# Patient Record
Sex: Female | Born: 1975 | Hispanic: No | Marital: Single | Smoking: Never smoker
Health system: Southern US, Community
[De-identification: ages and names within clinical notes are randomized; demographics above are authoritative.]

## PROBLEM LIST (undated history)

## (undated) ENCOUNTER — Emergency Department: Discharge status: Against Medical Advice | Payer: Self-pay

## (undated) HISTORY — PX: OTHER SURGICAL HISTORY: SHX169

## (undated) HISTORY — PX: CHOLECYSTECTOMY: SHX55

---

## 1999-07-17 ENCOUNTER — Encounter: Payer: Self-pay | Admitting: *Deleted

## 1999-07-17 ENCOUNTER — Encounter: Admission: RE | Admit: 1999-07-17 | Discharge: 1999-07-17 | Payer: Self-pay | Admitting: *Deleted

## 2001-07-05 ENCOUNTER — Ambulatory Visit (HOSPITAL_COMMUNITY): Admission: RE | Admit: 2001-07-05 | Discharge: 2001-07-05 | Payer: Self-pay | Admitting: *Deleted

## 2002-07-19 ENCOUNTER — Encounter: Payer: Self-pay | Admitting: Family Medicine

## 2002-07-19 ENCOUNTER — Ambulatory Visit (HOSPITAL_COMMUNITY): Admission: RE | Admit: 2002-07-19 | Discharge: 2002-07-19 | Payer: Self-pay | Admitting: Family Medicine

## 2002-10-28 ENCOUNTER — Emergency Department (HOSPITAL_COMMUNITY): Admission: EM | Admit: 2002-10-28 | Discharge: 2002-10-29 | Payer: Self-pay

## 2002-10-30 ENCOUNTER — Emergency Department (HOSPITAL_COMMUNITY): Admission: EM | Admit: 2002-10-30 | Discharge: 2002-10-30 | Payer: Self-pay | Admitting: Emergency Medicine

## 2009-06-05 ENCOUNTER — Encounter: Admission: RE | Admit: 2009-06-05 | Discharge: 2009-07-07 | Payer: Self-pay | Admitting: Sports Medicine

## 2009-10-16 ENCOUNTER — Ambulatory Visit: Payer: Self-pay | Admitting: Emergency Medicine

## 2009-12-11 ENCOUNTER — Ambulatory Visit: Payer: Self-pay | Admitting: Family Medicine

## 2009-12-11 ENCOUNTER — Encounter: Admission: RE | Admit: 2009-12-11 | Discharge: 2009-12-11 | Payer: Self-pay | Admitting: Family Medicine

## 2009-12-11 DIAGNOSIS — R109 Unspecified abdominal pain: Secondary | ICD-10-CM | POA: Insufficient documentation

## 2009-12-11 DIAGNOSIS — K810 Acute cholecystitis: Secondary | ICD-10-CM | POA: Insufficient documentation

## 2009-12-16 ENCOUNTER — Encounter: Payer: Self-pay | Admitting: Family Medicine

## 2009-12-22 ENCOUNTER — Ambulatory Visit: Payer: Self-pay | Admitting: Family Medicine

## 2009-12-22 DIAGNOSIS — I10 Essential (primary) hypertension: Secondary | ICD-10-CM | POA: Insufficient documentation

## 2009-12-23 LAB — CONVERTED CEMR LAB
ALT: 33 units/L (ref 0–35)
AST: 28 units/L (ref 0–37)
Albumin: 4.1 g/dL (ref 3.5–5.2)
Alkaline Phosphatase: 57 units/L (ref 39–117)
BUN: 12 mg/dL (ref 6–23)
CO2: 24 meq/L (ref 19–32)
Calcium: 9.2 mg/dL (ref 8.4–10.5)
Chloride: 106 meq/L (ref 96–112)
Cholesterol: 152 mg/dL (ref 0–200)
Creatinine, Ser: 0.65 mg/dL (ref 0.40–1.20)
Glucose, Bld: 83 mg/dL (ref 70–99)
HDL: 35 mg/dL — ABNORMAL LOW (ref >39)
LDL Cholesterol: 97 mg/dL (ref 0–99)
Potassium: 4.4 meq/L (ref 3.5–5.3)
Sodium: 141 meq/L (ref 135–145)
TSH: 3.074 microintl units/mL (ref 0.350–4.500)
Total Bilirubin: 0.4 mg/dL (ref 0.3–1.2)
Total CHOL/HDL Ratio: 4.3
Total Protein: 7.3 g/dL (ref 6.0–8.3)
Triglycerides: 102 mg/dL (ref <150)
VLDL: 20 mg/dL (ref 0–40)

## 2009-12-26 ENCOUNTER — Encounter: Payer: Self-pay | Admitting: Family Medicine

## 2010-01-12 ENCOUNTER — Ambulatory Visit (HOSPITAL_COMMUNITY): Admission: RE | Admit: 2010-01-12 | Discharge: 2010-01-13 | Payer: Self-pay | Admitting: Surgery

## 2010-01-12 ENCOUNTER — Encounter (INDEPENDENT_AMBULATORY_CARE_PROVIDER_SITE_OTHER): Payer: Self-pay | Admitting: Surgery

## 2010-04-07 NOTE — Letter (Signed)
Summary: [facility]   Imported By: Sherian Rein 01/06/2010 15:05:13  _____________________________________________________________________  External Attachment:    Type:   Image     Comment:   External Document

## 2010-04-07 NOTE — Letter (Signed)
Summary: SUMMARY FROM [facility]  SUMMARY FROM [facility]   Imported By: Dannette Barbara 12/26/2009 08:45:45  _____________________________________________________________________  External Attachment:    Type:   Image     Comment:   External Document

## 2010-04-07 NOTE — Assessment & Plan Note (Signed)
Summary: STOMACH PAIN/NAUESA  History of Present Illness History from: patient Chief Complaint: nausea History of Present Illness: 35yo female with nausea and GERD symptoms for a few days.  Has had burning especially with recent stress.  Decreased oral intake due to nausea and other symptoms. No CP, SOB, F/C.  No vomiting or diarrhea.  Zofran 8mg  taken this morning has started to help.  Greasy foods make worse. No abd pain.   Physical Exam General appearance: well developed, well nourished, no acute distress Chest/Lungs: no rales, wheezes, or rhonchi bilateral, breath sounds equal without effort Heart: regular rate and  rhythm, no murmur Abdomen: soft, non-tender without obvious organomegaly Skin: no obvious rashes or lesions Assessment New Problems: NAUSEA (ICD-787.02) GERD (ICD-530.81)   Plan New Medications/Changes: NEXIUM 40 MG CPDR (ESOMEPRAZOLE MAGNESIUM) 1 tab by mouth daily  #30 x 2, 10/16/2009, Hoyt Koch MD ZOFRAN 8 MG TABS (ONDANSETRON HCL) 1 tab by mouth up to three times a day as needed for nausea  #15 x 0, 10/16/2009, Hoyt Koch MD  New Orders: No Charge Patient Arrived (NCPA0) [NCPA0] Planning Comments:   BRAT diet (rice, toast, soup, baNana's, bland foods... avoid greasy, fried, spicy foods for a few days) While waiting for the Nexium to start working, Prilosec OTC 150mg  two times a day will help control your acute symptoms.   Lots of hydration (water & Gatorade.  Avoid dairy products, sodas, coffee, acidic fruit juices) If symptoms get worse, may consider lab work or abdominal ultrasound to look at gall bladder.    The patient and/or caregiver has been counseled thoroughly with regard to medications prescribed including dosage, schedule, interactions, rationale for use, and possible side effects and they verbalize understanding.  Diagnoses and expected course of recovery discussed and will return if not improved as expected or if the condition  worsens. Patient and/or caregiver verbalized understanding.  Prescriptions: NEXIUM 40 MG CPDR (ESOMEPRAZOLE MAGNESIUM) 1 tab by mouth daily  #30 x 2   Entered and Authorized by:   Hoyt Koch MD   Signed by:   Hoyt Koch MD on 10/16/2009   Method used:   Print then Give to Patient   RxID:   [id number] ZOFRAN 8 MG TABS (ONDANSETRON HCL) 1 tab by mouth up to three times a day as needed for nausea  #15 x 0   Entered and Authorized by:   Hoyt Koch MD   Signed by:   Hoyt Koch MD on 10/16/2009   Method used:   Print then Give to Patient   RxID:   [id number]   Orders Added: 1)  No Charge Patient Arrived (NCPA0) [NCPA0]

## 2010-04-07 NOTE — Assessment & Plan Note (Signed)
Summary: SEVERE RIGHT SIDE PAIN    Current Allergies: No known allergies History of Present Illness History of Present Illness: Abdominal pain over the last month multiple episodes. Was placed on PPI recently but challenged he stomach last night w/chicken taco and spent the night in pain w/ nausea and vomiting.   Current Problems: CHOLECYSTITIS - ACUTE (ICD-575.0) ABDOMINAL PAIN, ACUTE (ICD-789.00) ABDOMINAL PAIN, ACUTE (ICD-789.00)   Current Meds NEXIUM 40 MG CPDR (ESOMEPRAZOLE MAGNESIUM) 1 tab by mouth daily SUCRALFATE 1 GM TABS (SUCRALFATE) sig 2 tablets by mouth twice a day on empty stomach PROMETHAZINE HCL 25 MG  TABS (PROMETHAZINE HCL) sig 1 by mouth q6-8hrs as needed for nausea  REVIEW OF SYSTEMS       Gastrointestinal       Complains of stomach pain and nausea/vomiting.   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  Plan New Medications/Changes: PROMETHAZINE HCL 25 MG  TABS (PROMETHAZINE HCL) sig 1 by mouth q6-8hrs as needed for nausea  #12 x 2, 12/11/2009, Hassan Rowan MD SUCRALFATE 1 GM TABS (SUCRALFATE) sig 2 tablets by mouth twice a day on empty stomach  #120 x 2, 12/11/2009, Hassan Rowan MD  New Orders: Ultrasound Abdomen [UAS] Ultrasound Abdomen [UAS] Ultrasound Abdomen [UAS] New Patient Level III [99203] Planning Comments:   stongly suggest consider survical intervention bland diet  carafate 2 tablets by mouth 2x a day recommend repeat US of  Kidney in 6 months due to congenital changes   The patient and/or caregiver has been counseled thoroughly with regard to medications prescribed including dosage, schedule, interactions, rationale for use, and possible side effects and they verbalize understanding.  Diagnoses and expected course of recovery discussed and will return if not improved as expected or if the condition worsens. Patient and/or caregiver verbalized understanding.  Prescriptions: PROMETHAZINE HCL 25 MG  TABS (PROMETHAZINE HCL) sig 1 by mouth q6-8hrs as needed for nausea  #12 x 2   Entered and Authorized by:   Hassan Rowan MD   Signed by:   Hassan Rowan MD on 12/11/2009   Method used:   Print then Give to Patient   RxID:   [id number] SUCRALFATE 1 GM TABS (SUCRALFATE) sig 2 tablets by mouth twice a day on empty stomach  #120 x 2   Entered and Authorized by:   Hassan Rowan MD   Signed by:   Hassan Rowan MD on 12/11/2009   Method used:   Print then Give to Patient   RxID:   [id number]   Patient Instructions: 1)  surgical referal is needed for choleecystectomy 2)  and repeat US oin 6 months  Orders Added: 1)  Ultrasound Abdomen [UAS] 2)  Ultrasound Abdomen [UAS] 3)  Ultrasound Abdomen [UAS] 4)  New Patient Level III [84696]

## 2010-04-07 NOTE — Assessment & Plan Note (Signed)
Summary: NOV: New dx HTN   Vital Signs:  Patient profile:   35 year old female Height:      67 inches Weight:      265 pounds BMI:     41.65 Pulse rate:   67 / minute BP sitting:   140 / 92  (right arm) Cuff size:   large  Vitals Entered By: Avon Gully CMA, Duncan Dull) (December 22, 2009 8:42 AM) CC: NP-check thyrois and BP   CC:  NP-check thyrois and BP.  History of Present Illness: Has GB scheduled in Nov 7th. Doesn't feel well today.  BP has been running int he 130/90s. Feelsing nauseated adn RUQ pain. Avoiding fried foods and dairy. She has surgery for GB removal in 3 weeks.  Hasn't taken any meds today but does have a PPI and phernergan that she can take.  Would like her thyroid checked.  No regular exercise acitivity right now and says she knows she needs to lose weight.   Habits & Providers  Alcohol-Tobacco-Diet     Tobacco Status: never  Exercise-Depression-Behavior     Does Patient Exercise: no     Drug Use: no  Current Medications (verified): 1)  Nexium 40 Mg Cpdr (Esomeprazole Magnesium) .Marland Kitchen.. 1 Tab By Mouth Daily 2)  Sucralfate 1 Gm Tabs (Sucralfate) .... Sig 2 Tablets By Mouth Twice A Day On Empty Stomach 3)  Promethazine Hcl 25 Mg  Tabs (Promethazine Hcl) .... Sig 1 By Mouth Q6-8hrs As Needed For Nausea  Allergies (verified): No Known Drug Allergies  Comments:  Nurse/Medical Assistant: The patient's medications and allergies were reviewed with the patient and were updated in the Medication and Allergy Lists. Avon Gully CMA, Duncan Dull) (December 22, 2009 8:43 AM)  Past History:  Past Medical History: NOne  Past Surgical History: Wisdom teeth removed.   Family History: Mom wiht DM   Social History: Never Smoked Alcohol use-yes Drug use-no Regular exercise-no 2 caffeinated drinks per day. Smoking Status:  never Drug Use:  no Does Patient Exercise:  no  Review of Systems       No fever/sweats/weakness, unexplained weight loss/gain.  No  vison changes.  No difficulty hearing/ringing in ears, hay fever/allergies.  No chest pain/discomfort, palpitations.  No Br lump/nipple discharge.  No cough/wheeze.  No blood in BM, nausea/vomiting/diarrhea.  No nighttime urination, leaking urine, unusual vaginal bleeding, discharge (penis or vagina).  No muscle/joint pain. No rash, change in mole.  No HA, memory loss.  No anxiety, sleep d/o, depression.  No easy bruising/bleeding, unexplained lump   Physical Exam  General:  Well-developed,well-nourished,in no acute distress; alert,appropriate and cooperative throughout examination Head:  Normocephalic and atraumatic without obvious abnormalities. No apparent alopecia or balding. Eyes:  Wears glasses.  Neck:  No deformities, masses, or tenderness noted. NO TM.  Lungs:  Normal respiratory effort, chest expands symmetrically. Lungs are clear to auscultation, no crackles or wheezes. Heart:  Normal rate and regular rhythm. S1 and S2 normal without gallop, murmur, click, rub or other extra sounds. NO carotid or abdominal bruit.  Pulses:  Radials 2+  Extremities:  No LE edema.  Skin:  no rashes.   Psych:  Cognition and judgment appear intact. Alert and cooperative with normal attention span and concentration. No apparent delusions, illusions, hallucinations   Impression & Recommendations:  Problem # 1:  HYPERTENSION, MILD (ICD-401.1) Assessment New Dx dx adn need for weigh loss, exercise and low sat diet.  She is having surgery in 3 weeks and I don't want  her BP to prevent her from getting her surgery to will start HCTZ, half a tab and then f/u in 6 weeks. Reviewed DASH diet. Needs screening labs to rule out other causes.  Orders: T-Comprehensive Metabolic Panel [phone]) T-Lipid Profile ([id number]) T-TSH [phone])  Her updated medication list for this problem includes:    Hydrochlorothiazide 25 Mg Tabs (Hydrochlorothiazide) .Marland Kitchen... Take 1 tablet by mouth once a day  BP today:  140/92  Complete Medication List: 1)  Nexium 40 Mg Cpdr (Esomeprazole magnesium) .Marland Kitchen.. 1 tab by mouth daily 2)  Sucralfate 1 Gm Tabs (Sucralfate) .... Sig 2 tablets by mouth twice a day on empty stomach 3)  Promethazine Hcl 25 Mg Tabs (Promethazine hcl) .... Sig 1 by mouth q6-8hrs as needed for nausea 4)  Hydrochlorothiazide 25 Mg Tabs (Hydrochlorothiazide) .... Take 1 tablet by mouth once a day  Patient Instructions: 1)  Google the DASH diet ( .nih/gov) to review to help lower your BP.  2)  Start half a tab of HCTZ for blood pressure.  3)  Please schedule a follow-up appointment in 6 weeks.  Prescriptions: HYDROCHLOROTHIAZIDE 25 MG TABS (HYDROCHLOROTHIAZIDE) Take 1 tablet by mouth once a day  #30 x 1   Entered and Authorized by:   Nani Gasser MD   Signed by:   Nani Gasser MD on 12/22/2009   Method used:   [facility] to        [facility] [address] [phone]* (retail)       [address]       [place], [place]  [address]       Ph: [phone] or [phone]       Fax: [phone]   RxID:   [id number]    Orders Added: 1)  T-Comprehensive Metabolic Panel [[id number]] 2)  T-Lipid Profile [[id number]] 3)  T-TSH [[id number]] 4)  New Patient Level III [10932]

## 2010-05-19 LAB — CBC
Hemoglobin: 10.3 g/dL — ABNORMAL LOW (ref 12.0–15.0)
MCH: 19.7 pg — ABNORMAL LOW (ref 26.0–34.0)
MCHC: 29.1 g/dL — ABNORMAL LOW (ref 30.0–36.0)
Platelets: 417 10*3/uL — ABNORMAL HIGH (ref 150–400)
RBC: 5.24 MIL/uL — ABNORMAL HIGH (ref 3.87–5.11)

## 2010-05-19 LAB — COMPREHENSIVE METABOLIC PANEL
ALT: 43 U/L — ABNORMAL HIGH (ref 0–35)
AST: 41 U/L — ABNORMAL HIGH (ref 0–37)
Albumin: 3.9 g/dL (ref 3.5–5.2)
CO2: 27 mEq/L (ref 19–32)
Calcium: 9.4 mg/dL (ref 8.4–10.5)
Creatinine, Ser: 0.64 mg/dL (ref 0.4–1.2)
GFR calc Af Amer: >60 mL/min (ref >60)
Sodium: 140 mEq/L (ref 135–145)

## 2010-05-19 LAB — DIFFERENTIAL
Basophils Relative: 1 % (ref 0–1)
Eosinophils Relative: 5 % (ref 0–5)
Lymphocytes Relative: 29 % (ref 12–46)
Lymphs Abs: 2.1 10*3/uL (ref 0.7–4.0)
Monocytes Relative: 9 % (ref 3–12)
Neutro Abs: 4 10*3/uL (ref 1.7–7.7)

## 2010-05-19 LAB — SURGICAL PCR SCREEN
MRSA, PCR: NEGATIVE
Staphylococcus aureus: NEGATIVE

## 2010-08-21 ENCOUNTER — Inpatient Hospital Stay (INDEPENDENT_AMBULATORY_CARE_PROVIDER_SITE_OTHER)
Admission: RE | Admit: 2010-08-21 | Discharge: 2010-08-21 | Disposition: A | Discharge status: Home / Self Care | Payer: Self-pay | Source: Ambulatory Visit | Attending: Family Medicine | Admitting: Family Medicine

## 2010-08-21 ENCOUNTER — Encounter: Payer: Self-pay | Admitting: Family Medicine

## 2010-08-21 DIAGNOSIS — L738 Other specified follicular disorders: Secondary | ICD-10-CM

## 2010-11-08 ENCOUNTER — Encounter: Payer: Self-pay | Admitting: Family Medicine

## 2010-12-13 ENCOUNTER — Encounter: Payer: Self-pay | Admitting: Family Medicine

## 2010-12-14 ENCOUNTER — Encounter: Payer: Self-pay | Admitting: Family Medicine

## 2010-12-14 ENCOUNTER — Ambulatory Visit (INDEPENDENT_AMBULATORY_CARE_PROVIDER_SITE_OTHER): Payer: PRIVATE HEALTH INSURANCE | Admitting: Family Medicine

## 2010-12-14 VITALS — BP 154/96 | HR 79 | Wt 279.0 lb

## 2010-12-14 DIAGNOSIS — I10 Essential (primary) hypertension: Secondary | ICD-10-CM

## 2010-12-14 DIAGNOSIS — R9389 Abnormal findings on diagnostic imaging of other specified body structures: Secondary | ICD-10-CM

## 2010-12-14 DIAGNOSIS — Q639 Congenital malformation of kidney, unspecified: Secondary | ICD-10-CM | POA: Insufficient documentation

## 2010-12-14 DIAGNOSIS — Q649 Congenital malformation of urinary system, unspecified: Secondary | ICD-10-CM

## 2010-12-14 DIAGNOSIS — D649 Anemia, unspecified: Secondary | ICD-10-CM

## 2010-12-14 DIAGNOSIS — R93429 Abnormal radiologic findings on diagnostic imaging of unspecified kidney: Secondary | ICD-10-CM

## 2010-12-14 NOTE — Progress Notes (Signed)
  Subjective:    Patient ID: Theresa Beard, female    DOB: 1975/11/09, 35 y.o.   MRN: [id number]  HPI She tried to give blood last week and it has the hemoglobin was 9.9. Had surgery  Novmber 2011.  No  Prior hx of anemia. Has periods that are only heavy the first couple of day. No blood in the urine and stool. In the last 2 months has been more irritable with her periods. Only cramps the first day.  Took 400mg  of IBU and feels better today. No blood loss from other sources. No prior history of anemia. No dizziness, lightheadedness or fatigue. No shortness of breath.  She has had her cholecystectomy since last time I saw her. She is off of all of her medications. She actually says she's felt great since she had her gallbladder taken out.   Review of Systems     Objective:   Physical Exam  Constitutional: She is oriented to person, place, and time. She appears well-developed and well-nourished.  HENT:  Head: Normocephalic and atraumatic.  Eyes: Pupils are equal, round, and reactive to light.  Cardiovascular: Normal rate, regular rhythm and normal heart sounds.   Pulmonary/Chest: Effort normal and breath sounds normal. No respiratory distress. She has no wheezes. She has no rales. She exhibits no tenderness.  Neurological: She is alert and oriented to person, place, and time.  Skin: Skin is warm and dry.  Psychiatric: She has a normal mood and affect. Her behavior is normal.          Assessment & Plan:  Anemia-will check a full CBC, vitamin B12, folate, iron level. If her iron is low we'll put her on oral iron replacement and recheck in 8 weeks. If her vitamin B12 is low then we can start her with injections once a week for one month then every other week for one month and then recheck her levels. We also discussed that if her periods continue to be heavy and is causing iron deficiency anemia and we need to consider birth control to help regulate her periods.  Hypertension-she is  off of her hydrochlorothiazide. She says she feels very stressed this afternoon like to come back in the morning for repeat blood pressure check. If it is not in the normal range since she will likely need to restart her HCTZ  Also she is due for a followup ultrasound on her left kidney. 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 This was found during her ultrasound for her gallbladder. She was supposed to followup in 6 months and has been about 11 months. We will get this scheduled down stairs.

## 2010-12-15 ENCOUNTER — Telehealth: Payer: Self-pay | Admitting: *Deleted

## 2010-12-15 LAB — CBC WITH DIFFERENTIAL/PLATELET
Basophils Relative: 1 % (ref 0–1)
HCT: 36.2 % (ref 36.0–46.0)
Hemoglobin: 10.3 g/dL — ABNORMAL LOW (ref 12.0–15.0)
Lymphocytes Relative: 27 % (ref 12–46)
Lymphs Abs: 3.2 10*3/uL (ref 0.7–4.0)
MCHC: 28.5 g/dL — ABNORMAL LOW (ref 30.0–36.0)
Monocytes Relative: 8 % (ref 3–12)
Neutro Abs: 7 10*3/uL (ref 1.7–7.7)
Neutrophils Relative %: 59 % (ref 43–77)
RBC: 5.56 MIL/uL — ABNORMAL HIGH (ref 3.87–5.11)
WBC: 11.8 10*3/uL — ABNORMAL HIGH (ref 4.0–10.5)

## 2010-12-15 LAB — VITAMIN B12: Vitamin B-12: 862 pg/mL (ref 211–911)

## 2010-12-15 LAB — IRON: Iron: 27 ug/dL — ABNORMAL LOW (ref 42–145)

## 2010-12-15 LAB — FOLATE: Folate: 5 ng/mL

## 2010-12-15 NOTE — Telephone Encounter (Signed)
Good repeat BP is well controlled.

## 2010-12-15 NOTE — Telephone Encounter (Signed)
Pt's BP this am is 130/84

## 2011-01-04 ENCOUNTER — Ambulatory Visit: Payer: 59 | Attending: Emergency Medicine | Admitting: Physical Therapy

## 2011-01-04 DIAGNOSIS — M542 Cervicalgia: Secondary | ICD-10-CM | POA: Insufficient documentation

## 2011-01-04 DIAGNOSIS — M2560 Stiffness of unspecified joint, not elsewhere classified: Secondary | ICD-10-CM | POA: Insufficient documentation

## 2011-01-04 DIAGNOSIS — IMO0001 Reserved for inherently not codable concepts without codable children: Secondary | ICD-10-CM | POA: Insufficient documentation

## 2011-01-05 ENCOUNTER — Telehealth: Payer: Self-pay | Admitting: Emergency Medicine

## 2011-01-08 ENCOUNTER — Ambulatory Visit: Payer: 59 | Attending: Emergency Medicine | Admitting: Physical Therapy

## 2011-01-08 DIAGNOSIS — M542 Cervicalgia: Secondary | ICD-10-CM | POA: Insufficient documentation

## 2011-01-08 DIAGNOSIS — M2560 Stiffness of unspecified joint, not elsewhere classified: Secondary | ICD-10-CM | POA: Insufficient documentation

## 2011-01-08 DIAGNOSIS — IMO0001 Reserved for inherently not codable concepts without codable children: Secondary | ICD-10-CM | POA: Insufficient documentation

## 2011-01-15 ENCOUNTER — Ambulatory Visit: Payer: 59 | Admitting: Physical Therapy

## 2011-01-25 ENCOUNTER — Ambulatory Visit: Payer: 59 | Admitting: Physical Therapy

## 2011-02-01 ENCOUNTER — Ambulatory Visit: Payer: 59 | Admitting: Physical Therapy

## 2011-02-03 ENCOUNTER — Encounter: Payer: PRIVATE HEALTH INSURANCE | Admitting: Family Medicine

## 2011-02-07 ENCOUNTER — Encounter: Payer: Self-pay | Admitting: Emergency Medicine

## 2011-02-07 ENCOUNTER — Emergency Department
Admit: 2011-02-07 | Discharge: 2011-02-07 | Disposition: A | Discharge status: Home / Self Care | Payer: PRIVATE HEALTH INSURANCE

## 2011-02-07 ENCOUNTER — Emergency Department (INDEPENDENT_AMBULATORY_CARE_PROVIDER_SITE_OTHER)
Admission: EM | Admit: 2011-02-07 | Discharge: 2011-02-07 | Disposition: A | Discharge status: Home / Self Care | Payer: PRIVATE HEALTH INSURANCE | Source: Home / Self Care | Attending: Emergency Medicine | Admitting: Emergency Medicine

## 2011-02-07 DIAGNOSIS — M25572 Pain in left ankle and joints of left foot: Secondary | ICD-10-CM

## 2011-02-07 DIAGNOSIS — M25579 Pain in unspecified ankle and joints of unspecified foot: Secondary | ICD-10-CM

## 2011-02-07 DIAGNOSIS — H6090 Unspecified otitis externa, unspecified ear: Secondary | ICD-10-CM

## 2011-02-07 NOTE — ED Provider Notes (Signed)
History     CSN: [id number] Arrival date & time: No admission date for patient encounter.   First MD Initiated Contact with Patient 02/07/11 1700      No chief complaint on file.   (Consider location/radiation/quality/duration/timing/severity/associated sxs/prior treatment) HPI This presents today with left ankle pain. Approximately 2-3 days ago she stepped in a hole in her yard and noticed some ankle pain afterwards. Since then she's been using some ice and Advil which did help a little bit. Last night she states she tripped and the pain increased after that. The pain is located over the lateral aspect of the ankle. It is a constant pain.  No past medical history on file.  Past Surgical History  Procedure Date  . Wisdom teeth     Family History  Problem Relation Age of Onset  . Diabetes Mother     History  Substance Use Topics  . Smoking status: Never Smoker   . Smokeless tobacco: Not on file  . Alcohol Use: Yes    OB History    Grav Para Term Preterm Abortions TAB SAB Ect Mult Living                  Review of Systems  Allergies  Review of patient's allergies indicates no known allergies.  Home Medications   Current Outpatient Rx  Name Route Sig Dispense Refill  . HYDROCHLOROTHIAZIDE 25 MG PO TABS Oral Take 25 mg by mouth daily.        There were no vitals taken for this visit.  Physical Exam  Nursing note and vitals reviewed. Constitutional: She is oriented to person, place, and time. She appears well-developed and well-nourished.  HENT:  Head: Normocephalic and atraumatic.  Neck: Neck supple.  Cardiovascular: Regular rhythm and normal heart sounds.   Pulmonary/Chest: Effort normal and breath sounds normal. No respiratory distress.  Musculoskeletal:       L ankle/foot: FROM, +TTP ATFL in the posterior aspect of her lateral malleolus..   No TTP medial malleolus, navicular, base of 5th, calcaneus, Achilles, proximal fibula.  Minimal swelling.  No  ecchymoses.  Distal neurovascular status is intact.   Neurological: She is alert and oriented to person, place, and time.  Skin: Skin is warm and dry.  Psychiatric: She has a normal mood and affect. Her speech is normal.    ED Course  Procedures (including critical care time)  Labs Reviewed - No data to display No results found.   1. Left ankle pain       MDM  An X-ray was ordered and read by the radiologist as above. Encourage rest, ice, compression with ACE bandage and/or a brace, and elevation of injured body part.  She has an ankle brace that she can use as needed.  Lily Kocher, MD 02/07/11 [phone]

## 2011-02-07 NOTE — ED Notes (Signed)
Patient staff member [facility]; seen directly by Dr.Henderson who ordered xray. No other Triage done.

## 2011-02-08 ENCOUNTER — Ambulatory Visit: Payer: 59 | Attending: Emergency Medicine | Admitting: Physical Therapy

## 2011-02-08 DIAGNOSIS — M2560 Stiffness of unspecified joint, not elsewhere classified: Secondary | ICD-10-CM | POA: Insufficient documentation

## 2011-02-08 DIAGNOSIS — IMO0001 Reserved for inherently not codable concepts without codable children: Secondary | ICD-10-CM | POA: Insufficient documentation

## 2011-02-08 DIAGNOSIS — M542 Cervicalgia: Secondary | ICD-10-CM | POA: Insufficient documentation

## 2011-02-08 NOTE — Telephone Encounter (Signed)
  Phone Note Call from Patient   Summary of Call: Patient with recent R sided posterior neck pain.  Evaluated in clinic and she has spasms and tenderness in the trapezius muscle.  No meningitic signs.  Already has been sent to PT, has tried TENS unit which is helping, and Ibu which is helping.  No heavy lifting, pushing, pulling, etc.  Possibly job related?   Will Rx Flexeril 10mg  1/2 - 1 by mouth three times a day as needed for spasms #21 + Ibuprofen 800mg  three times a day as needed for poain #24.  Continue PT, should buy a heating pad.  If further problems or worsening pain, consider Xray or referral to SM. Initial call taken by: Hoyt Koch MD,  January 05, 2011 1:26 PM

## 2011-02-08 NOTE — Progress Notes (Signed)
Summary: KNOT RT UNDERARM    Current Allergies: No known allergies History of Present Illness Chief Complaint: Tender knot in right axilla History of Present Illness:  Subjective:  Patient complains of small tender nodule in right axilla for 3 days.     Objective:  No acute distress  Right axilla:  Single small 1mm dia prominent follicle with about 8mm dia surrounding erythema, tender to palpation.  Not fluctuant. Assessment New Problems: FOLLICULITIS (ICD-704.8)   Plan New Medications/Changes: CEPHALEXIN 500 MG CAPS (CEPHALEXIN) One by mouth two times a day  #14 x 0, 08/21/2010, Donna Christen MD  New Orders: Est. Patient Level III [phone] Planning Comments:   Begin Keflex.  Apply warm compress 2 or 3 times daily.   The patient and/or caregiver has been counseled thoroughly with regard to medications prescribed including dosage, schedule, interactions, rationale for use, and possible side effects and they verbalize understanding.  Diagnoses and expected course of recovery discussed and will return if not improved as expected or if the condition worsens. Patient and/or caregiver verbalized understanding.  Prescriptions: CEPHALEXIN 500 MG CAPS (CEPHALEXIN) One by mouth two times a day  #14 x 0   Entered and Authorized by:   Donna Christen MD   Signed by:   Donna Christen MD on 08/21/2010   Method used:   Print then Give to Patient   RxID:   [id number]   Orders Added: 1)  Est. Patient Level III [44034]

## 2011-02-08 NOTE — Progress Notes (Signed)
Summary: Early Cellulitis  Subjective:  Patient complains of early discomfort and erythema groin area; ? ingrown hair.  Assessment:   ? Early cellulitis/folliculitis  Plan:  DOXYCYCLINE HYCLATE 100 MG CAPS (DOXYCYCLINE HYCLATE) One by mouth two times a day Rx sent. Prescriptions: DOXYCYCLINE HYCLATE 100 MG CAPS (DOXYCYCLINE HYCLATE) One by mouth two times a day  #20 x 0   Entered and Authorized by:   Donna Christen MD   Signed by:   Donna Christen MD on 11/08/2010   Method used:   [facility]        [facility] [address] [phone]* (retail)       [address]       [place], [place]  [address]       Ph: [phone] or [phone]       Fax: [phone]   RxID:   [id number]  Return to clinic is develops increasing pain, swelling, etc. Donna Christen MD  November 08, 2010 2:02 PM

## 2011-05-31 ENCOUNTER — Ambulatory Visit: Payer: 59 | Attending: Emergency Medicine | Admitting: Physical Therapy

## 2011-05-31 DIAGNOSIS — M25676 Stiffness of unspecified foot, not elsewhere classified: Secondary | ICD-10-CM | POA: Insufficient documentation

## 2011-05-31 DIAGNOSIS — M25579 Pain in unspecified ankle and joints of unspecified foot: Secondary | ICD-10-CM | POA: Insufficient documentation

## 2011-05-31 DIAGNOSIS — R5381 Other malaise: Secondary | ICD-10-CM | POA: Insufficient documentation

## 2011-05-31 DIAGNOSIS — IMO0001 Reserved for inherently not codable concepts without codable children: Secondary | ICD-10-CM | POA: Insufficient documentation

## 2011-05-31 DIAGNOSIS — M25673 Stiffness of unspecified ankle, not elsewhere classified: Secondary | ICD-10-CM | POA: Insufficient documentation

## 2011-06-03 MED ORDER — NEOMYCIN-POLYMYXIN-HC 3.5-10000-1 OT SOLN: 3.0000 [drp] | Freq: Four times a day (QID) | OTIC | Status: AC

## 2011-06-03 NOTE — ED Provider Notes (Signed)
Pt seen by Dr. Alvester Morin for AOE.  Marlaine Hind, MD 06/03/11 [phone]

## 2011-06-03 NOTE — ED Provider Notes (Signed)
History     CSN: [id number]  Arrival date & time 02/07/11  1658   First MD Initiated Contact with Patient 02/07/11 1700      Chief Complaint  Patient presents with  . Ankle Pain   Patient is a 36 y.o. female presenting with ear pain.  Otalgia This is a new problem. The current episode started yesterday. There is pain in both ears. The problem occurs constantly. There has been no fever. The pain is at a severity of 5/10. The pain is mild. Associated symptoms include rhinorrhea. Pertinent negatives include no ear discharge. Her past medical history does not include chronic ear infection.    History reviewed. No pertinent past medical history.  Past Surgical History  Procedure Date  . Wisdom teeth     Family History  Problem Relation Age of Onset  . Diabetes Mother     History  Substance Use Topics  . Smoking status: Never Smoker   . Smokeless tobacco: Not on file  . Alcohol Use: Yes    OB History    Grav Para Term Preterm Abortions TAB SAB Ect Mult Living                  Review of Systems  HENT: Positive for ear pain and rhinorrhea. Negative for ear discharge.   All other systems reviewed and are negative.    Allergies  Review of patient's allergies indicates no known allergies.  Home Medications   Current Outpatient Rx  Name Route Sig Dispense Refill  . HYDROCHLOROTHIAZIDE 25 MG PO TABS Oral Take 25 mg by mouth daily.      . NEOMYCIN-POLYMYXIN-HC 3.5-10000-1 OT SOLN Both Ears Place 3 drops into both ears 4 (four) times daily. 10 mL 0    There were no vitals taken for this visit.  Physical Exam  Constitutional: She appears well-developed and well-nourished.  HENT:  Head: Normocephalic and atraumatic.       TMs with mild bulging bilaterally, + canal erythema bilaterally, + tenderness with otoscopic evaluation  +nasal erythema, rhinorrhea bilaterally, + post oropharyngeal erythema    Neck: Normal range of motion. No thyromegaly present.    Cardiovascular: Normal rate and normal heart sounds.   Pulmonary/Chest: Effort normal.  Lymphadenopathy:    She has no cervical adenopathy.  Neurological: She is alert.  Skin: Skin is warm. No rash noted. No erythema.  Psychiatric: She has a normal mood and affect. Her behavior is normal.    ED Course  Procedures (including critical care time)  Labs Reviewed - No data to display No results found.   Otitis Externa     MDM  Bilateral otitis externa with concominant URI. Cortisporin otic and supportive care.         Floydene Flock, MD 06/03/11 [phone]

## 2011-06-08 ENCOUNTER — Emergency Department
Admission: EM | Admit: 2011-06-08 | Discharge: 2011-06-08 | Disposition: A | Discharge status: Home / Self Care | Payer: PRIVATE HEALTH INSURANCE | Source: Home / Self Care | Attending: Family Medicine | Admitting: Family Medicine

## 2011-06-08 DIAGNOSIS — H6690 Otitis media, unspecified, unspecified ear: Secondary | ICD-10-CM

## 2011-06-08 DIAGNOSIS — H9209 Otalgia, unspecified ear: Secondary | ICD-10-CM

## 2011-06-08 MED ORDER — AMOXICILLIN 875 MG PO TABS: 875.0000 mg | ORAL_TABLET | Freq: Two times a day (BID) | ORAL | Status: AC

## 2011-06-08 NOTE — ED Provider Notes (Addendum)
History     CSN: [id number]  Arrival date & time      First MD Initiated Contact with Patient 06/08/11 0826      No chief complaint on file.     HPI Comments: Patient complains of approximately 5 day history of gradually progressive URI symptoms, all now improved except for new development of left earache and decreased hearing.  No drainage from the ear.    The history is provided by the patient.    No past medical history on file.  Past Surgical History  Procedure Date  . Wisdom teeth     Family History  Problem Relation Age of Onset  . Diabetes Mother     History  Substance Use Topics  . Smoking status: Never Smoker   . Smokeless tobacco: Not on file  . Alcohol Use: Yes    OB History    Grav Para Term Preterm Abortions TAB SAB Ect Mult Living                  Review of Systems  Constitutional: Positive for fatigue.  HENT: Positive for ear pain.   Eyes: Negative.   Respiratory: Positive for cough.   Cardiovascular: Negative.   Gastrointestinal: Negative.   Genitourinary: Negative.     Allergies  Review of patient's allergies indicates no known allergies.  Home Medications   Current Outpatient Rx  Name Route Sig Dispense Refill  . AMOXICILLIN 875 MG PO TABS Oral Take 1 tablet (875 mg total) by mouth 2 (two) times daily. 20 tablet 0  . HYDROCHLOROTHIAZIDE 25 MG PO TABS Oral Take 25 mg by mouth daily.      . NEOMYCIN-POLYMYXIN-HC 3.5-10000-1 OT SOLN Both Ears Place 3 drops into both ears 4 (four) times daily. 10 mL 0    There were no vitals taken for this visit.  Physical Exam  Constitutional: She appears well-developed and well-nourished. No distress.  HENT:  Right Ear: Tympanic membrane and ear canal normal.  Left Ear: Ear canal normal. A middle ear effusion is present.  Mouth/Throat: Oropharynx is clear and moist.       Left tympanic membrane is erythematous with poor landmarks  Eyes: Pupils are equal, round, and reactive to light.    ED  Course  Procedures none   Tympanogram:  Low peak height left ear; negative pressure right ear    1. Otitis media       MDM   Begin amoxicillin for 10 days. Take Mucinex D (guaifenesin with decongestant) twice daily for congestion, or plain Mucinex plus Sudafed.  Increase fluid intake, rest. May use Afrin nasal spray (or generic oxymetazoline) twice daily for about 5 days.  Also recommend using saline nasal spray several times daily and saline nasal irrigation (AYR is a common brand) Stop all antihistamines for now, and other non-prescription cough/cold preparations. Followup with Family Doctor in one week.          [name], MD 06/08/11 [phone]  Addendum:  Patient complains of persistent sensation of blockage in left ear. Exam:  Bilateral serous effusions Tympanogram:  Normal on right;  Left ear low peak height ("flat line") Plan:  Add tapering course of prednisone.  Finish amoxicillin.  [name], MD 06/15/11 1349

## 2011-06-08 NOTE — Discharge Instructions (Signed)
Take Mucinex D (guaifenesin with decongestant) twice daily for congestion, or plain Mucinex plus Sudafed.  Increase fluid intake, rest. May use Afrin nasal spray (or generic oxymetazoline) twice daily for about 5 days.  Also recommend using saline nasal spray several times daily and saline nasal irrigation (AYR is a common brand) Stop all antihistamines for now, and other non-prescription cough/cold preparations.

## 2011-06-15 MED ORDER — PREDNISONE 10 MG PO TABS: ORAL_TABLET | ORAL | Status: DC

## 2011-09-07 ENCOUNTER — Emergency Department (INDEPENDENT_AMBULATORY_CARE_PROVIDER_SITE_OTHER)
Admission: EM | Admit: 2011-09-07 | Discharge: 2011-09-07 | Disposition: A | Discharge status: Home / Self Care | Payer: PRIVATE HEALTH INSURANCE | Source: Home / Self Care

## 2011-09-07 ENCOUNTER — Encounter: Payer: Self-pay | Admitting: *Deleted

## 2011-09-07 DIAGNOSIS — S161XXA Strain of muscle, fascia and tendon at neck level, initial encounter: Secondary | ICD-10-CM

## 2011-09-07 DIAGNOSIS — S139XXA Sprain of joints and ligaments of unspecified parts of neck, initial encounter: Secondary | ICD-10-CM

## 2011-09-07 MED ORDER — KETOROLAC TROMETHAMINE 60 MG/2ML IM SOLN
60.0000 mg | Freq: Once | INTRAMUSCULAR | Status: AC
  Administered 2011-09-07: 60 mg via INTRAMUSCULAR

## 2011-09-07 NOTE — ED Notes (Signed)
Pt c/o neck pain x 6 mths, worse x 1wk.

## 2011-09-07 NOTE — ED Provider Notes (Signed)
History     CSN: [id number]  Arrival date & time 09/07/11  1509   First MD Initiated Contact with Patient 09/07/11 1516      Chief Complaint  Patient presents with  . Neck Pain    (Consider location/radiation/quality/duration/timing/severity/associated sxs/prior treatment) HPI Comments: Neck pain x 1 week Prior history of cervical sprain that improved s/p NSAIDs and PT.  Pt has had recurrence of pain.  Unsure of cause. Pt thinks that it may be due to sleeping in wrong position.  Pain predominantly in post neck.  Pain with flexion and extension.  No nuchal rigidity.  No radicular sxs.  Has been using NSAIDs intermittently with minimal to mild improvement in sxs.     No past medical history on file.  Past Surgical History  Procedure Date  . Wisdom teeth     Family History  Problem Relation Age of Onset  . Diabetes Mother     History  Substance Use Topics  . Smoking status: Never Smoker   . Smokeless tobacco: Not on file  . Alcohol Use: Yes    OB History    Grav Para Term Preterm Abortions TAB SAB Ect Mult Living                  Review of Systems  All other systems reviewed and are negative.    Allergies  Review of patient's allergies indicates no known allergies.  Home Medications   Current Outpatient Rx  Name Route Sig Dispense Refill  . HYDROCHLOROTHIAZIDE 25 MG PO TABS Oral Take 25 mg by mouth daily.      Marland Kitchen PREDNISONE 10 MG PO TABS  Take 2 tabs by mouth today, then two tabs twice daily for three days, then one tab twice daily for 2 days, then 1 tab daily for two days.  Take PC 20 tablet 0    There were no vitals taken for this visit.  Physical Exam  Constitutional: She appears well-developed and well-nourished.  HENT:  Head: Normocephalic and atraumatic.    Eyes: Conjunctivae are normal. Pupils are equal, round, and reactive to light.  Neck: Normal range of motion. No thyromegaly present.  Lymphadenopathy:    She has no cervical  adenopathy.    ED Course  Procedures (including critical care time)  Labs Reviewed - No data to display No results found.   No diagnosis found.    MDM  Cervical sprain Toradol 60mg  IM x 1 Scheduled NSAIDs and tylenol.  Discussed general exercises and red flags.  If pain persists, may consider rereferral to PT    The patient and/or caregiver has been counseled thoroughly with regard to treatment plan and/or medications prescribed including dosage, schedule, interactions, rationale for use, and possible side effects and they verbalize understanding. Diagnoses and expected course of recovery discussed and will return if not improved as expected or if the condition worsens. Patient and/or caregiver verbalized understanding.             Floydene Flock, MD 09/07/11 1524

## 2011-09-09 NOTE — ED Provider Notes (Signed)
Agree with exam, assessment, and plan.   [name], MD 09/09/11 0830

## 2011-11-05 ENCOUNTER — Ambulatory Visit (INDEPENDENT_AMBULATORY_CARE_PROVIDER_SITE_OTHER): Payer: 59

## 2011-11-05 ENCOUNTER — Ambulatory Visit (INDEPENDENT_AMBULATORY_CARE_PROVIDER_SITE_OTHER): Payer: 59 | Admitting: Sports Medicine

## 2011-11-05 ENCOUNTER — Encounter: Payer: Self-pay | Admitting: Sports Medicine

## 2011-11-05 VITALS — BP 152/91 | HR 99 | Temp 98.2°F | Resp 18 | Wt 285.0 lb

## 2011-11-05 DIAGNOSIS — IMO0001 Reserved for inherently not codable concepts without codable children: Secondary | ICD-10-CM | POA: Insufficient documentation

## 2011-11-05 DIAGNOSIS — I10 Essential (primary) hypertension: Secondary | ICD-10-CM

## 2011-11-05 DIAGNOSIS — M62838 Other muscle spasm: Secondary | ICD-10-CM

## 2011-11-05 MED ORDER — CYCLOBENZAPRINE HCL 10 MG PO TABS: ORAL_TABLET | ORAL | Status: AC

## 2011-11-05 MED ORDER — MELOXICAM 15 MG PO TABS: ORAL_TABLET | ORAL | Status: DC

## 2011-11-05 NOTE — Progress Notes (Addendum)
Patient ID: Nataliah Hatlestad, female   DOB: 1975/09/06, 36 y.o.   MRN: [id number] Subjective:    CC: Neck pain  HPI: This is a very pleasant 36 year old female, she works in the urgent care center next door. Unfortunately, for the past year she's had pain which localizes along the left-sided paracervical musculature, radiating down into her shoulder, and medial border of her scapula. This does not wake her from sleep, and she's had formal physical therapy which was effective in the past. She has never had trigger point injections, but has been using an occasional Mobic which is also moderately efficacious. The pain does not radiate down into her fingertips, and is not associated with fever, chills, night sweats, or weight loss.  Past medical history, Surgical history, Family history, Social history, Allergies, and medications have been entered into the medical record, reviewed, and no changes needed.   Review of Systems: No fevers, chills, night sweats, weight loss, chest pain, or shortness of breath.   Objective:    General: Well Developed, well nourished, and in no acute distress.  Neuro: Alert and oriented x3, extra-ocular muscles intact.  HEENT: Normocephalic, atraumatic, pupils equal round reactive to light, extraocular muscles intact Skin: Warm and dry, no rashes. Cardiopulmonary: Speaking full sentences, not using accessory muscles. Neck: Inspection unremarkable. No palpable stepoffs. Negative Spurling's maneuver. Full neck range of motion To palpation along the trapezius both in the paraspinal region, as well as periscapular. Grip strength and sensation normal in bilateral hands Strength good C4 to T1 distribution No sensory change to C4 to T1 Negative Hoffman sign bilaterally Reflexes normal  Procedure:  Injection of 3 trigger points along the paracervical, and periscapular musculature. Consent obtained and verified. Time-out conducted. Noted no overlying erythema,  induration, or other signs of local infection. Skin prepped in a sterile fashion. Topical analgesic spray: Ethyl chloride. Completed without difficulty. Meds: A total of 1 cc Kenalog 40, 5 cc of lidocaine injected through 25-gauge needle into the trigger points. Advised to call if fevers/chills, erythema, induration, drainage, or persistent bleeding.  I did obtain x-rays of her cervical spine. They were reviewed by me. This showed moderate degenerative disc disease at C5-C6 level, with straightening of the normal cervical lordosis suggestive of spasm.  Impression and Recommendations:

## 2011-11-05 NOTE — Assessment & Plan Note (Signed)
Trigger point injections as above. Short course of cyclobenzaprine. Refilling meloxicam. Home rehabilitation exercises. Return to clinic as needed.

## 2011-11-05 NOTE — Assessment & Plan Note (Signed)
Her blood pressure was moderately elevated today, I do suspect that this is related to her current pain. She understands that this needs to be rechecked.

## 2011-11-23 ENCOUNTER — Ambulatory Visit: Payer: Self-pay

## 2011-11-23 ENCOUNTER — Encounter: Payer: Self-pay | Admitting: Sports Medicine

## 2011-11-23 ENCOUNTER — Ambulatory Visit (INDEPENDENT_AMBULATORY_CARE_PROVIDER_SITE_OTHER): Payer: 59

## 2011-11-23 ENCOUNTER — Ambulatory Visit (INDEPENDENT_AMBULATORY_CARE_PROVIDER_SITE_OTHER): Payer: 59 | Admitting: Sports Medicine

## 2011-11-23 VITALS — BP 150/91 | HR 79 | Temp 98.9°F

## 2011-11-23 DIAGNOSIS — I10 Essential (primary) hypertension: Secondary | ICD-10-CM

## 2011-11-23 DIAGNOSIS — E669 Obesity, unspecified: Secondary | ICD-10-CM

## 2011-11-23 DIAGNOSIS — M7672 Peroneal tendinitis, left leg: Secondary | ICD-10-CM | POA: Insufficient documentation

## 2011-11-23 DIAGNOSIS — M79609 Pain in unspecified limb: Secondary | ICD-10-CM

## 2011-11-23 DIAGNOSIS — M79672 Pain in left foot: Secondary | ICD-10-CM

## 2011-11-23 MED ORDER — PHENTERMINE HCL 37.5 MG PO TABS: 37.5000 mg | ORAL_TABLET | Freq: Every day | ORAL | Status: DC

## 2011-11-23 MED ORDER — ORLISTAT 120 MG PO CAPS: 120.0000 mg | ORAL_CAPSULE | Freq: Three times a day (TID) | ORAL | Status: DC

## 2011-11-23 NOTE — Assessment & Plan Note (Addendum)
Hypertension present. Orlistat, qsymia, lorcaserin are not covered and too expensive. We can try phentermine, I will start as she has requested but would like her to continue this with her primary physician.Marland Kitchen

## 2011-11-23 NOTE — Assessment & Plan Note (Signed)
Lateral heel wedge. Corrected leg length discrepancy. Ankle rehabilitation sizes. She will come back to see me in 4 weeks, if no better we can consider an ultrasound guided injection.

## 2011-11-23 NOTE — Assessment & Plan Note (Addendum)
Continues to be elevated. Starting DASH diet. We'll do this for 3 months. Again, she should follow up with her primary physician for this.

## 2011-11-23 NOTE — Patient Instructions (Addendum)
You should take a multivitamin with vitamins A., D., K., and E. Check out the DASH diet  1.5 Gram Low Sodium Diet  A 1.5 gram sodium diet restricts the amount of sodium in the diet to no more than 1.5 g or 1500 mg daily. The American Heart Association recommends Americans over the age of 50 to consume no more than 1500 mg of sodium each day to reduce the risk of developing high blood pressure. Research also shows that limiting sodium may reduce heart attack and stroke risk. Many foods contain sodium for flavor and sometimes as a preservative. When the amount of sodium in a diet needs to be low, it is important to know what to look for when choosing foods and drinks. The following includes some information and guidelines to help make it easier for you to adapt to a low sodium diet.  QUICK TIPS  Do not add salt to food.  Avoid convenience items and fast food.  Choose unsalted snack foods.  Buy lower sodium products, often labeled as "lower sodium" or "no salt added."  Check food labels to learn how much sodium is in 1 serving.  When eating at a restaurant, ask that your food be prepared with less salt or none, if possible.  READING FOOD LABELS FOR SODIUM INFORMATION  The nutrition facts label is a good place to find how much sodium is in foods. Look for products with no more than 400 mg of sodium per serving.  Remember that 1.5 g = 1500 mg.  The food label may also list foods as:  Sodium-free: Less than 5 mg in a serving.  Very low sodium: 35 mg or less in a serving.  Low-sodium: 140 mg or less in a serving.  Light in sodium: 50% less sodium in a serving. For example, if a food that usually has 300 mg of sodium is changed to become light in sodium, it will have 150 mg of sodium.  Reduced sodium: 25% less sodium in a serving. For example, if a food that usually has 400 mg of sodium is changed to reduced sodium, it will have 300 mg of sodium.  CHOOSING FOODS  Grains  Avoid: Salted crackers and  snack items. Some cereals, including instant hot cereals. Bread stuffing and biscuit mixes. Seasoned rice or pasta mixes.  Choose: Unsalted snack items. Low-sodium cereals, oats, puffed wheat and rice, shredded wheat. English muffins and bread. Pasta.  Meats  Avoid: Salted, canned, smoked, spiced, pickled meats, including fish and poultry. Bacon, ham, sausage, cold cuts, hot dogs, anchovies.  Choose: Low-sodium canned tuna and salmon. Fresh or frozen meat, poultry, and fish.  Dairy  Avoid: Processed cheese and spreads. Cottage cheese. Buttermilk and condensed milk. Regular cheese.  Choose: Milk. Low-sodium cottage cheese. Yogurt. Sour cream. Low-sodium cheese.  Fruits and Vegetables  Avoid: Regular canned vegetables. Regular canned tomato sauce and paste. Frozen vegetables in sauces. Olives. Rosita Fire. Relishes. Sauerkraut.  Choose: Low-sodium canned vegetables. Low-sodium tomato sauce and paste. Frozen or fresh vegetables. Fresh and frozen fruit.  Condiments  Avoid: Canned and packaged gravies. Worcestershire sauce. Tartar sauce. Barbecue sauce. Soy sauce. Steak sauce. Ketchup. Onion, garlic, and table salt. Meat flavorings and tenderizers.  Choose: Fresh and dried herbs and spices. Low-sodium varieties of mustard and ketchup. Lemon juice. Tabasco sauce. Horseradish.  SAMPLE 1.5 GRAM SODIUM MEAL PLAN  Breakfast / Sodium (mg)  1 cup low-fat milk / 143 mg  1 whole-wheat English muffin / 240 mg  1 tbs heart-healthy  margarine / 153 mg  1 hard-boiled egg / 139 mg  1 small orange / 0 mg  Lunch / Sodium (mg)  1 cup raw carrots / 76 mg  2 tbs no salt added peanut butter / 5 mg  2 slices whole-wheat bread / 270 mg  1 tbs jelly / 6 mg   cup red grapes / 2 mg  Dinner / Sodium (mg)  1 cup whole-wheat pasta / 2 mg  1 cup low-sodium tomato sauce / 73 mg  3 oz lean ground beef / 57 mg  1 small side salad (1 cup raw spinach leaves,  cup cucumber,  cup yellow bell pepper) with 1 tsp olive oil and 1  tsp red wine vinegar / 25 mg  Snack / Sodium (mg)  1 container low-fat vanilla yogurt / 107 mg  3 graham cracker squares / 127 mg  Nutrient Analysis  Calories: 1745  Protein: 75 g  Carbohydrate: 237 g  Fat: 57 g  Sodium: 1425 mg  Document Released: 02/22/2005 Document Revised: 11/04/2010 Document Reviewed: 05/26/2009  [facility] Patient Information 2012 [place], [place].     DASH Diet The DASH diet stands for "Dietary Approaches to Stop Hypertension." It is a healthy eating plan that has been shown to reduce high blood pressure (hypertension) in as little as 14 days, while also possibly providing other significant health benefits. These other health benefits include reducing the risk of breast cancer after menopause and reducing the risk of type 2 diabetes, heart disease, colon cancer, and stroke. Health benefits also include weight loss and slowing kidney failure in patients with chronic kidney disease.   DIET GUIDELINES  Limit salt (sodium). Your diet should contain less than 1500 mg of sodium daily.   Limit refined or processed carbohydrates. Your diet should include mostly whole grains. Desserts and added sugars should be used sparingly.   Include small amounts of heart-healthy fats. These types of fats include nuts, oils, and tub margarine. Limit saturated and trans fats. These fats have been shown to be harmful in the body.  CHOOSING FOODS   The following food groups are based on a 2000 calorie diet. See your Registered Dietitian for individual calorie needs. Grains and Grain Products (6 to 8 servings daily)  Eat More Often: Whole-wheat bread, brown rice, whole-grain or wheat pasta, quinoa, popcorn without added fat or salt (air popped).   Eat Less Often: White bread, white pasta, white rice, cornbread.  Vegetables (4 to 5 servings daily)  Eat More Often: Fresh, frozen, and canned vegetables. Vegetables may be raw, steamed, roasted, or grilled with a minimal amount of fat.    Eat Less Often/Avoid: Creamed or fried vegetables. Vegetables in a cheese sauce.  Fruit (4 to 5 servings daily)  Eat More Often: All fresh, canned (in natural juice), or frozen fruits. Dried fruits without added sugar. One hundred percent fruit juice ( cup [237 mL] daily).   Eat Less Often: Dried fruits with added sugar. Canned fruit in light or heavy syrup.  [employer], Fish, and Poultry (2 servings or less daily. One serving is 3 to 4 oz [85-114 g]).  Eat More Often: Ninety percent or leaner ground beef, tenderloin, sirloin. Round cuts of beef, chicken breast, [place] breast. All fish. Grill, bake, or broil your meat. Nothing should be fried.   Eat Less Often/Avoid: Fatty cuts of meat, [place], or chicken leg, thigh, or wing. Fried cuts of meat or fish.  Dairy (2 to 3 servings)  Eat More  Often: Low-fat or fat-free milk, low-fat plain or light yogurt, reduced-fat or part-skim cheese.   Eat Less Often/Avoid: Milk (whole, 2%, skim, or chocolate). Whole milk yogurt. Full-fat cheeses.  Nuts, Seeds, and Legumes (4 to 5 servings per week)  Eat More Often: All without added salt.   Eat Less Often/Avoid: Salted nuts and seeds, canned beans with added salt.  Fats and Sweets (limited)  Eat More Often: Vegetable oils, tub margarines without trans fats, sugar-free gelatin. Mayonnaise and salad dressings.   Eat Less Often/Avoid: Coconut oils, palm oils, butter, stick margarine, cream, half and half, cookies, candy, pie.  FOR MORE INFORMATION The Dash Diet Eating Plan: www.dashdiet.org Document Released: 02/11/2011 Document Reviewed: 02/01/2011 [facility] Patient Information 2012 [place], [place].

## 2011-11-23 NOTE — Progress Notes (Signed)
Subjective:    CC: Left foot pain  HPI: This is a pleasant 36 year old female with head pain she localizes behind the lateral malleolus as well as over the dorsolateral left foot for over one year now. She denies any recent trauma, but notes it's been worsening. The pain starts over the cuboid, radiates around behind her lateral malleolus, and partially up her leg. She denies any swelling. She has been using occasional Mobic for a previous neck-related issue. She's not had any injections, footwear modifications, or formal therapy for this. The pain is described as a ache. It is worse with walking, better with rest.  Past medical history, Surgical history, Family history, Social history, Allergies, and medications have been entered into the medical record, reviewed, and no changes needed.   Review of Systems: No headache, visual changes, nausea, vomiting, diarrhea, constipation, dizziness, abdominal pain, skin rash, fevers, chills, night sweats, weight loss, body aches, joint swelling, muscle aches, chest pain, or shortness of breath.   Objective:   Vitals:  Afebrile, vital signs stable. General: Well Developed, well nourished, and in no acute distress.  Neuro/Psych: Alert and oriented x3, extra-ocular muscles intact, able to move all 4 extremities.  Skin: Warm and dry, no rashes noted.  Respiratory: Not using accessory muscles, speaking in full sentences, trachea midline.  Cardiovascular: Pulses palpable, no extremity edema. Abdomen: Does not appear distended. Left Ankle: No visible erythema or swelling. Range of motion is full in all directions. Strength is 5/5 in all directions. Stable lateral and medial ligaments; squeeze test and kleiger test unremarkable; Talar dome nontender; No pain at base of 5th MT;  She has mild tenderness to palpation over cuboid. No tenderness over N spot or navicular prominence There is also tenderness to palpation behind the lateral malleolus with  reproduction of this pain with resisted eversion of the ankle in a dorsiflexed position No sign of peroneal tendon subluxations or tenderness to palpation Negative tarsal tunnel tinel's Able to walk 4 steps, she does tend to supinate.  1 cm leg length discrepancy, left longer than right. I placed the lateral wedge in her left shoe to try and get her to roll over more medially. I also placed a flat wedge in her right shoe to try to correct the leg length discrepancy. She ambulated and these were very comfortable.  Impression and Recommendations:   This case required medical decision making of moderate complexity.

## 2011-12-14 ENCOUNTER — Encounter: Payer: Self-pay | Admitting: Sports Medicine

## 2011-12-14 ENCOUNTER — Ambulatory Visit (INDEPENDENT_AMBULATORY_CARE_PROVIDER_SITE_OTHER): Payer: 59 | Admitting: Sports Medicine

## 2011-12-14 VITALS — BP 136/88 | HR 83 | Wt 275.0 lb

## 2011-12-14 DIAGNOSIS — M7750 Other enthesopathy of unspecified foot: Secondary | ICD-10-CM

## 2011-12-14 DIAGNOSIS — M7672 Peroneal tendinitis, left leg: Secondary | ICD-10-CM

## 2011-12-14 DIAGNOSIS — IMO0001 Reserved for inherently not codable concepts without codable children: Secondary | ICD-10-CM

## 2011-12-14 DIAGNOSIS — D509 Iron deficiency anemia, unspecified: Secondary | ICD-10-CM

## 2011-12-14 DIAGNOSIS — E669 Obesity, unspecified: Secondary | ICD-10-CM

## 2011-12-14 DIAGNOSIS — E559 Vitamin D deficiency, unspecified: Secondary | ICD-10-CM

## 2011-12-14 DIAGNOSIS — Q639 Congenital malformation of kidney, unspecified: Secondary | ICD-10-CM

## 2011-12-14 DIAGNOSIS — R269 Unspecified abnormalities of gait and mobility: Secondary | ICD-10-CM

## 2011-12-14 DIAGNOSIS — Q649 Congenital malformation of urinary system, unspecified: Secondary | ICD-10-CM

## 2011-12-14 NOTE — Assessment & Plan Note (Signed)
Did very well with lateral edges. Orthotics made as above.

## 2011-12-14 NOTE — Assessment & Plan Note (Signed)
Orthotics as above for peroneal tendinitis. She will see me on an as needed basis for adjustments.

## 2011-12-14 NOTE — Assessment & Plan Note (Signed)
Pain improved with conservative measures. Checking vitamin D

## 2011-12-14 NOTE — Assessment & Plan Note (Signed)
10 pounds weight loss so far. We'll do q. monthly and blood pressure checks for refills of phentermine.

## 2011-12-14 NOTE — Progress Notes (Signed)
Subjective:    CC: Orthotics  HPI: Peroneal tendinitis: Vastly improved with lateral wedges, orthotics will be made today  Obesity: 10 pounds of weight loss, blood pressure improved.  History of anemia: Desires recheck of CBC.  Past medical history, Surgical history, Family history, Social history, Allergies, and medications have been entered into the medical record, reviewed, and no changes needed.   Review of Systems: No fevers, chills, night sweats, weight loss, chest pain, or shortness of breath.   Objective:    General: Well Developed, well nourished, and in no acute distress.  Patient was fitted for a : standard, cushioned, semi-rigid orthotic. The orthotic was heated and afterward the patient stood on the orthotic blank positioned on the orthotic stand. The patient was positioned in subtalar neutral position and 10 degrees of ankle dorsiflexion in a weight bearing stance. After completion of molding, a stable base was applied to the orthotic blank. The blank was ground to a stable position for weight bearing. Size: 8 Base: Blue EVA Additional Posting and Padding: None The patient ambulated these, and they were very comfortable.  Impression and Recommendations:

## 2011-12-14 NOTE — Addendum Note (Signed)
Addended by: Ellsworth Lennox on: 12/14/2011 02:52 PM   Modules accepted: Orders

## 2011-12-15 LAB — CBC WITH DIFFERENTIAL/PLATELET
Basophils Absolute: 0 10*3/uL (ref 0.0–0.1)
Basophils Relative: 0 % (ref 0–1)
Eosinophils Absolute: 0.2 10*3/uL (ref 0.0–0.7)
Eosinophils Relative: 2 % (ref 0–5)
HCT: 35.7 % — ABNORMAL LOW (ref 36.0–46.0)
Hemoglobin: 11 g/dL — ABNORMAL LOW (ref 12.0–15.0)
Lymphocytes Relative: 27 % (ref 12–46)
Lymphs Abs: 2.8 K/uL (ref 0.7–4.0)
MCH: 19.7 pg — ABNORMAL LOW (ref 26.0–34.0)
MCHC: 30.8 g/dL (ref 30.0–36.0)
MCV: 64.1 fL — ABNORMAL LOW (ref 78.0–100.0)
Monocytes Absolute: 0.6 K/uL (ref 0.1–1.0)
Monocytes Relative: 6 % (ref 3–12)
Neutro Abs: 6.7 K/uL (ref 1.7–7.7)
Neutrophils Relative %: 65 % (ref 43–77)
Platelets: 594 10*3/uL — ABNORMAL HIGH (ref 150–400)
RBC: 5.57 MIL/uL — ABNORMAL HIGH (ref 3.87–5.11)
RDW: 18 % — ABNORMAL HIGH (ref 11.5–15.5)
WBC: 10.3 10*3/uL (ref 4.0–10.5)

## 2011-12-15 LAB — VITAMIN D 25 HYDROXY (VIT D DEFICIENCY, FRACTURES): Vit D, 25-Hydroxy: 14 ng/mL — ABNORMAL LOW (ref 30–89)

## 2011-12-15 MED ORDER — FERROUS SULFATE 325 (65 FE) MG PO TBEC: 325.0000 mg | DELAYED_RELEASE_TABLET | Freq: Three times a day (TID) | ORAL | Status: DC

## 2011-12-15 MED ORDER — ERGOCALCIFEROL 1.25 MG (50000 UT) PO CAPS: 50000.0000 [IU] | ORAL_CAPSULE | ORAL | Status: DC

## 2011-12-15 NOTE — Addendum Note (Signed)
Addended by: Tinley Rought J on: 12/15/2011 08:52 AM   Modules accepted: Orders  

## 2012-01-11 ENCOUNTER — Ambulatory Visit (INDEPENDENT_AMBULATORY_CARE_PROVIDER_SITE_OTHER): Payer: 59 | Admitting: Sports Medicine

## 2012-01-11 VITALS — BP 133/87 | Wt 269.0 lb

## 2012-01-11 DIAGNOSIS — E669 Obesity, unspecified: Secondary | ICD-10-CM

## 2012-01-11 DIAGNOSIS — K219 Gastro-esophageal reflux disease without esophagitis: Secondary | ICD-10-CM

## 2012-01-11 MED ORDER — PHENTERMINE HCL 37.5 MG PO TABS: 37.5000 mg | ORAL_TABLET | Freq: Every day | ORAL | Status: DC

## 2012-01-11 MED ORDER — ESOMEPRAZOLE MAGNESIUM 40 MG PO CPDR: DELAYED_RELEASE_CAPSULE | ORAL | Status: DC

## 2012-01-11 NOTE — Assessment & Plan Note (Signed)
Has dropped 6 pounds, she was congratulated. Also like her to see one of our nutritionists. They will call her. We will refill her phentermine, and I'll see her back in a month.

## 2012-01-11 NOTE — Assessment & Plan Note (Signed)
Dental caries, throat clearing. 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 Nexium 40 mg at dinnertime. We'll revisit this in 8 weeks.

## 2012-01-11 NOTE — Progress Notes (Signed)
Subjective:    CC: Followup  HPI: Obesity:  Theresa Beard is doing very well. She has no side effects from the medication. Overall she's lost 6 pounds since the last visit. She is still working hard to control her diet, and would like some extra help. She denies any chest pain, racing heart beat, or difficulty sleeping.  Acid reflux: Was told by her dentist that multiple caries may be related to acid reflux, she also presents with throat clearing, sour brash, and throat soreness in the mornings. She does tend to eat somewhat late at night, but plans on improving this. The throat pain is localized, does not radiate, it is mild in severity.  Past medical history, Surgical history, Family history, Social history, Allergies, and medications have been entered into the medical record, reviewed, and no changes needed.   Review of Systems: No fevers, chills, night sweats, weight loss, chest pain, or shortness of breath.   Objective:    General: Well Developed, well nourished, and in no acute distress.  Neuro: Alert and oriented x3, extra-ocular muscles intact.  HEENT: Normocephalic, atraumatic, pupils equal round reactive to light, neck supple, no masses, no lymphadenopathy, thyroid nonpalpable.  Skin: Warm and dry, no rashes. Cardiac: Regular rate and rhythm, no murmurs rubs or gallops.  Respiratory: Clear to auscultation bilaterally. Not using accessory muscles, speaking in full sentences.  Impression and Recommendations:

## 2012-01-13 ENCOUNTER — Ambulatory Visit: Payer: Self-pay | Admitting: Sports Medicine

## 2012-03-27 ENCOUNTER — Encounter: Payer: Self-pay | Admitting: Sports Medicine

## 2012-03-27 ENCOUNTER — Ambulatory Visit (INDEPENDENT_AMBULATORY_CARE_PROVIDER_SITE_OTHER): Payer: 59

## 2012-03-27 ENCOUNTER — Other Ambulatory Visit: Payer: Self-pay | Admitting: Sports Medicine

## 2012-03-27 ENCOUNTER — Ambulatory Visit (INDEPENDENT_AMBULATORY_CARE_PROVIDER_SITE_OTHER): Payer: 59 | Admitting: Sports Medicine

## 2012-03-27 VITALS — Wt 272.0 lb

## 2012-03-27 DIAGNOSIS — M2240 Chondromalacia patellae, unspecified knee: Secondary | ICD-10-CM

## 2012-03-27 DIAGNOSIS — M25569 Pain in unspecified knee: Secondary | ICD-10-CM

## 2012-03-27 NOTE — Progress Notes (Signed)
SPORTS MEDICINE CONSULTATION REPORT  Subjective:    CC: Left knee pain  HPI: This is a very pleasant 37 year old female who I've been seeing for weight loss management who comes back with a several week history of pain which localized in the anterior aspect of her left knee. The pain is localized, is worse with going up and down stairs, moderate, associated with cracking, no mechanical symptoms.  Past medical history, Surgical history, Family history not pertinant except as noted below, Social history, Allergies, and medications have been entered into the medical record, reviewed, and no changes needed.   Review of Systems: No headache, visual changes, nausea, vomiting, diarrhea, constipation, dizziness, abdominal pain, skin rash, fevers, chills, night sweats, weight loss, swollen lymph nodes, body aches, joint swelling, muscle aches, chest pain, shortness of breath, mood changes, visual or auditory hallucinations.   Objective:   Vitals:  Afebrile, vital signs stable. General: Well Developed, well nourished, and in no acute distress.  Neuro/Psych: Alert and oriented x3, extra-ocular muscles intact, able to move all 4 extremities, sensation grossly intact. Skin: Warm and dry, no rashes noted.  Respiratory: Not using accessory muscles, speaking in full sentences, trachea midline.  Cardiovascular: Pulses palpable, no extremity edema. Abdomen: Does not appear distended. Left Knee: Normal to inspection with no erythema or effusion or obvious bony abnormalities. Palpation normal with no warmth, joint line tenderness, patellar tenderness, or condyle tenderness. ROM full in flexion and extension and lower leg rotation. Ligaments with solid consistent endpoints including ACL, PCL, LCL, MCL. Negative Mcmurray's, Apley's, and Thessalonian tests. Painful patellar compression with crepitus. Patellar and quadriceps tendons unremarkable. Hamstring and quadriceps strength is normal.   Procedure:  Real-time Ultrasound Guided Injection of left knee. Device: GE Logiq E  Ultrasound guided injection is preferred based studies that show increased duration, increased effect, greater accuracy, decreased procedural pain, increased response rate, and decreased cost with ultrasound guided versus blind injection.  Verbal informed consent obtained.  Time-out conducted.  Noted no overlying erythema, induration, or other signs of local infection.  Skin prepped in a sterile fashion.  Local anesthesia: Topical Ethyl chloride.  With sterile technique and under real time ultrasound guidance:  2 cc Kenalog 40, 4 cc lidocaine injected into the suprapatellar recess. Completed without difficulty  Pain immediately resolved suggesting accurate placement of the medication.  Advised to call if fevers/chills, erythema, induration, drainage, or persistent bleeding.  Images permanently stored and available for review in the ultrasound unit.  Impression: Technically successful ultrasound guided injection.  Impression and Recommendations:   This case required medical decision making of moderate complexity.

## 2012-03-27 NOTE — Assessment & Plan Note (Signed)
Mobic, injection as above. Single visit with physical therapy and home rehabilitation exercises. X-rays. Return to see me in one month to see how things are going.

## 2012-04-03 ENCOUNTER — Ambulatory Visit: Payer: 59 | Attending: Sports Medicine | Admitting: Physical Therapy

## 2012-04-03 DIAGNOSIS — E669 Obesity, unspecified: Secondary | ICD-10-CM | POA: Insufficient documentation

## 2012-04-03 DIAGNOSIS — M6281 Muscle weakness (generalized): Secondary | ICD-10-CM | POA: Insufficient documentation

## 2012-04-03 DIAGNOSIS — IMO0001 Reserved for inherently not codable concepts without codable children: Secondary | ICD-10-CM | POA: Insufficient documentation

## 2012-04-03 DIAGNOSIS — M12869 Other specific arthropathies, not elsewhere classified, unspecified knee: Secondary | ICD-10-CM | POA: Insufficient documentation

## 2012-04-14 ENCOUNTER — Telehealth: Payer: Self-pay | Admitting: Family Medicine

## 2012-04-14 MED ORDER — NEOMYCIN-POLYMYXIN-HC 3.5-10000-1 OT SOLN: 3.0000 [drp] | Freq: Four times a day (QID) | OTIC | Status: AC

## 2012-04-14 NOTE — ED Notes (Signed)
Patient left ear pain for the past 2-3 days. Patient states she has rushing feeling whenever she moves her head as well some mild left-sided ear pain. Patient does have some significant tenderness to palpation on otoscopic exam in the ear canal. Tympanic membrane is intact and otherwise normal. There is a mild ear canal erythema. Will place patient on course of Cortisporin Otic for treatment of otitis externa.  Doree Albee, MD 04/14/12 [phone]

## 2012-04-21 ENCOUNTER — Ambulatory Visit (INDEPENDENT_AMBULATORY_CARE_PROVIDER_SITE_OTHER): Payer: 59 | Admitting: Sports Medicine

## 2012-04-21 ENCOUNTER — Ambulatory Visit (INDEPENDENT_AMBULATORY_CARE_PROVIDER_SITE_OTHER): Payer: 59

## 2012-04-21 ENCOUNTER — Encounter: Payer: Self-pay | Admitting: Sports Medicine

## 2012-04-21 ENCOUNTER — Telehealth: Payer: Self-pay

## 2012-04-21 VITALS — HR 111 | Temp 99.4°F | Resp 18

## 2012-04-21 DIAGNOSIS — J111 Influenza due to unidentified influenza virus with other respiratory manifestations: Secondary | ICD-10-CM

## 2012-04-21 DIAGNOSIS — R509 Fever, unspecified: Secondary | ICD-10-CM

## 2012-04-21 DIAGNOSIS — R0989 Other specified symptoms and signs involving the circulatory and respiratory systems: Secondary | ICD-10-CM

## 2012-04-21 DIAGNOSIS — IMO0001 Reserved for inherently not codable concepts without codable children: Secondary | ICD-10-CM

## 2012-04-21 DIAGNOSIS — R5381 Other malaise: Secondary | ICD-10-CM

## 2012-04-21 DIAGNOSIS — J3489 Other specified disorders of nose and nasal sinuses: Secondary | ICD-10-CM

## 2012-04-21 DIAGNOSIS — R059 Cough, unspecified: Secondary | ICD-10-CM

## 2012-04-21 DIAGNOSIS — R05 Cough: Secondary | ICD-10-CM

## 2012-04-21 MED ORDER — FLUTICASONE PROPIONATE 50 MCG/ACT NA SUSP: NASAL | Status: DC

## 2012-04-21 MED ORDER — OSELTAMIVIR PHOSPHATE 75 MG PO CAPS: 75.0000 mg | ORAL_CAPSULE | Freq: Two times a day (BID) | ORAL | Status: DC

## 2012-04-21 MED ORDER — KETOROLAC TROMETHAMINE 30 MG/ML IM SOLN
30.0000 mg | Freq: Once | INTRAMUSCULAR | Status: AC
  Administered 2012-04-21: 30 mg via INTRAMUSCULAR

## 2012-04-21 MED ORDER — AZITHROMYCIN 250 MG PO TABS: ORAL_TABLET | ORAL | Status: DC

## 2012-04-21 NOTE — Progress Notes (Signed)
Subjective:    CC: Sick  HPI: This is a very pleasant 37 year old female who comes in with a 1-1/2 day history of fatigue, myalgias and body aches, cough, sinus pressure radiating to the ears, no GI symptoms. Headache. Mild rhinorrhea. No abdominal pain. Symptoms are severe and worsening.  Past medical history, Surgical history, Family history not pertinant except as noted below, Social history, Allergies, and medications have been entered into the medical record, reviewed, and no changes needed.   Review of Systems: No fevers, chills, night sweats, weight loss, chest pain, or shortness of breath.   Objective:    General: Well Developed, well nourished, and in no acute distress.  Neuro: Alert and oriented x3, extra-ocular muscles intact, sensation grossly intact.  HEENT: Normocephalic, atraumatic, pupils equal round reactive to light, neck supple, no masses, no lymphadenopathy, thyroid nonpalpable. Oropharynx, extraocular canals unremarkable, nasopharynx is boggy and erythematous turbinates. There is tenderness to palpation and percussion over the left maxillary sinus. Skin: Warm and dry, no rashes. Cardiac: Regular rate and rhythm, no murmurs rubs or gallops.  Respiratory: Clear to auscultation bilaterally. Not using accessory muscles, speaking in full sentences.  Chest x-ray was reviewed, and is negative for infiltrate. Impression and Recommendations:

## 2012-04-21 NOTE — Telephone Encounter (Signed)
Opened in error

## 2012-04-21 NOTE — Assessment & Plan Note (Signed)
There is an element of acute left maxillary sinusitis. Flonase, azithromycin. Crackles in the left lower lobe, chest x-ray though she is covered for community-acquired pneumonia. Tamiflu. Toradol 30 mg intramuscular. Mobic as needed at home.

## 2012-04-25 ENCOUNTER — Telehealth: Payer: Self-pay | Admitting: *Deleted

## 2012-04-25 MED ORDER — BENZONATATE 200 MG PO CAPS: 200.0000 mg | ORAL_CAPSULE | Freq: Three times a day (TID) | ORAL | Status: DC | PRN

## 2012-04-25 NOTE — Telephone Encounter (Signed)
Adding tessalon perrles, non-ortho requests should really go to PCP for next time.

## 2012-04-25 NOTE — Telephone Encounter (Signed)
Pt informed

## 2012-04-25 NOTE — Telephone Encounter (Signed)
Pt states she has finished abx and is coughing a lot. She would like to know if you can send some cough syrup to the pharmacy.

## 2012-05-05 ENCOUNTER — Ambulatory Visit (INDEPENDENT_AMBULATORY_CARE_PROVIDER_SITE_OTHER): Payer: 59 | Admitting: Sports Medicine

## 2012-05-05 ENCOUNTER — Encounter: Payer: Self-pay | Admitting: Sports Medicine

## 2012-05-05 VITALS — BP 142/90 | HR 84 | Temp 97.8°F | Wt 273.0 lb

## 2012-05-05 DIAGNOSIS — J111 Influenza due to unidentified influenza virus with other respiratory manifestations: Secondary | ICD-10-CM

## 2012-05-05 NOTE — Progress Notes (Signed)
Subjective:    CC: Upper respiratory infection  HPI: Theresa Beard is a 37 year-old woman who presents with lingering cough, rhinorrhea, and sore throat after being treated for acute bacterial sinusitis 2 weeks ago. At that time, she was prescribed fluticasone and a course of azithromycin, which she finished approximately 1 1/2 weeks ago. She feels she had 80% symptomatic improvement at the time she finished the treatment but has had little improvement since then. She describes a sensation of "water rushing" in her left ear. She has taken [employer] and Allegra with moderate improvement of symptoms. She has not consistently used fluticasone.  She denies myalagias, subjective fevers, chills, and headache.  Past medical history, Surgical history, Family history not pertinant except as noted below, Social history, Allergies, and medications have been entered into the medical record, reviewed, and no changes needed.     Objective:    General: Well Developed, well nourished, and in no acute distress.  Neuro: Alert and oriented x3, extra-ocular muscles intact, sensation grossly intact.  HEENT: Normocephalic, atraumatic, pupils equal round reactive to light, neck supple, no masses, no lymphadenopathy, thyroid nonpalpable. TMs intact, normal light reflex. Throat non-erythematous, no exudate. Nasal mucosa non-erythematous. Skin: Warm and dry, no rashes. Cardiac: Regular rate and rhythm, no murmurs rubs or gallops.  Respiratory: Clear to auscultation bilaterally. No wheezing or crackles. Not using accessory muscles, speaking in full sentences. Impression and Recommendations:

## 2012-05-05 NOTE — Assessment & Plan Note (Signed)
Some persistence of rhinorrhea with postnasal drip. I emphasized the use of Flonase, and she will use some leftover Tessalon Perles for her mild cough. Overall she is doing significantly better and she can followup on an as-needed basis.

## 2012-06-09 ENCOUNTER — Ambulatory Visit (INDEPENDENT_AMBULATORY_CARE_PROVIDER_SITE_OTHER): Payer: 59 | Admitting: Sports Medicine

## 2012-06-09 VITALS — BP 141/99 | HR 65

## 2012-06-09 DIAGNOSIS — M2242 Chondromalacia patellae, left knee: Secondary | ICD-10-CM

## 2012-06-09 DIAGNOSIS — M2240 Chondromalacia patellae, unspecified knee: Secondary | ICD-10-CM

## 2012-06-09 MED ORDER — MELOXICAM 15 MG PO TABS: 15.0000 mg | ORAL_TABLET | Freq: Every day | ORAL | Status: DC

## 2012-06-09 NOTE — Assessment & Plan Note (Signed)
Repeat injection today. Continue exercises. Knee brace given. Part of the differential includes a medial meniscal tear. If she's not better at the one-month followup visit we are getting an MRI.

## 2012-06-09 NOTE — Progress Notes (Signed)
   Subjective:    CC: Left knee pain  HPI: Theresa Beard comes back with a recurrence of her left knee pain. I injected it several months ago, and she has been doing her patellofemoral exercises. Unfortunately, she took a misstep, and twisted, and now has increasing swelling and pain which he localizes over the medial joint line. She denies any mechanical symptoms, the pain is localized, does not radiate.  Past medical history, Surgical history, Family history not pertinant except as noted below, Social history, Allergies, and medications have been entered into the medical record, reviewed, and no changes needed.   Review of Systems: No headache, visual changes, nausea, vomiting, diarrhea, constipation, dizziness, abdominal pain, skin rash, fevers, chills, night sweats, weight loss, swollen lymph nodes, body aches, joint swelling, muscle aches, chest pain, shortness of breath, mood changes, visual or auditory hallucinations.   Objective:   General: Well Developed, well nourished, and in no acute distress.  Neuro/Psych: Alert and oriented x3, extra-ocular muscles intact, able to move all 4 extremities, sensation grossly intact. Skin: Warm and dry, no rashes noted.  Respiratory: Not using accessory muscles, speaking in full sentences, trachea midline.  Cardiovascular: Pulses palpable, no extremity edema. Abdomen: Does not appear distended. Left Knee: Moderate effusion visible with a fluid wave. Tender to palpation over the medial joint line. ROM full in flexion and extension and lower leg rotation. Ligaments with solid consistent endpoints including ACL, PCL, LCL, MCL. Negative Mcmurray's, Apley's, and Thessalonian tests. Non painful patellar compression. Patellar glide without crepitus. Patellar and quadriceps tendons unremarkable. Hamstring and quadriceps strength is normal.   Procedure: Real-time Ultrasound Guided aspiration/Injection of left knee Device: GE Logiq E  Ultrasound guided  injection is preferred based studies that show increased duration, increased effect, greater accuracy, decreased procedural pain, increased response rate, and decreased cost with ultrasound guided versus blind injection.  Verbal informed consent obtained.  Time-out conducted.  Noted no overlying erythema, induration, or other signs of local infection.  Skin prepped in a sterile fashion.  Local anesthesia: Topical Ethyl chloride.  With sterile technique and under real time ultrasound guidance:  5 cc of lidocaine infiltrated into the subcutaneous tissues above the suprapatellar recess. 60 cc syringe with an 18-gauge needle advanced into real-time ultrasound guidance to the suprapatellar recess, unfortunately I could not reach the recess due to patient habitus. The syringe was switched, 2 cc Kenalog 40, 4 cc lidocaine injected and this did appear to flow into the suprapatellar recess. Completed without difficulty  Pain immediately resolved suggesting accurate placement of the medication.  Advised to call if fevers/chills, erythema, induration, drainage, or persistent bleeding.  I did not store any images for this procedure in the ultrasound unit. Impression: Technically successful ultrasound guided injection.  The knee was then strapped with compressive bandage. Impression and Recommendations:   This case required medical decision making of moderate complexity.

## 2012-12-08 ENCOUNTER — Other Ambulatory Visit: Payer: Self-pay | Admitting: *Deleted

## 2012-12-08 DIAGNOSIS — K219 Gastro-esophageal reflux disease without esophagitis: Secondary | ICD-10-CM

## 2012-12-08 MED ORDER — ESOMEPRAZOLE MAGNESIUM 40 MG PO CPDR: DELAYED_RELEASE_CAPSULE | ORAL | Status: DC

## 2012-12-14 ENCOUNTER — Other Ambulatory Visit: Payer: Self-pay | Admitting: *Deleted

## 2012-12-14 MED ORDER — PANTOPRAZOLE SODIUM 40 MG PO TBEC: 40.0000 mg | DELAYED_RELEASE_TABLET | Freq: Every day | ORAL | Status: DC

## 2012-12-14 NOTE — Telephone Encounter (Signed)
Changed to Protonix per Dr. Linford Arnold due to copay change at pt's pharmacy.  Meyer Cory, LPN

## 2013-02-15 ENCOUNTER — Other Ambulatory Visit: Payer: Self-pay | Admitting: Sports Medicine

## 2013-02-15 DIAGNOSIS — J111 Influenza due to unidentified influenza virus with other respiratory manifestations: Secondary | ICD-10-CM

## 2013-02-15 MED ORDER — MELOXICAM 15 MG PO TABS: ORAL_TABLET | ORAL | Status: DC

## 2013-02-15 MED ORDER — OSELTAMIVIR PHOSPHATE 75 MG PO CAPS: 75.0000 mg | ORAL_CAPSULE | Freq: Two times a day (BID) | ORAL | Status: DC

## 2013-02-15 NOTE — Assessment & Plan Note (Signed)
Tamiflu, Mobic. Return as needed.

## 2013-03-16 ENCOUNTER — Ambulatory Visit (INDEPENDENT_AMBULATORY_CARE_PROVIDER_SITE_OTHER): Payer: 59 | Admitting: Sports Medicine

## 2013-03-16 VITALS — BP 120/80 | HR 80 | Resp 16

## 2013-03-16 DIAGNOSIS — M7550 Bursitis of unspecified shoulder: Secondary | ICD-10-CM | POA: Insufficient documentation

## 2013-03-16 DIAGNOSIS — M25519 Pain in unspecified shoulder: Secondary | ICD-10-CM

## 2013-03-16 DIAGNOSIS — M25512 Pain in left shoulder: Secondary | ICD-10-CM

## 2013-03-16 NOTE — Progress Notes (Signed)
  Subjective:    CC: Left shoulder pain  HPI: Several days ago this pleasant 38 year old female was reaching behind her back, felt a slight stretching sensation, and since then has had pain that she localizes over the deltoid, worse with reaching behind her back, without radiation, moderate, persistent.  Past medical history, Surgical history, Family history not pertinant except as noted below, Social history, Allergies, and medications have been entered into the medical record, reviewed, and no changes needed.   Review of Systems: No fevers, chills, night sweats, weight loss, chest pain, or shortness of breath.   Objective:    General: Well Developed, well nourished, and in no acute distress.  Neuro: Alert and oriented x3, extra-ocular muscles intact, sensation grossly intact.  HEENT: Normocephalic, atraumatic, pupils equal round reactive to light, neck supple, no masses, no lymphadenopathy, thyroid nonpalpable.  Skin: Warm and dry, no rashes. Cardiac: Regular rate and rhythm, no murmurs rubs or gallops, no lower extremity edema.  Respiratory: Clear to auscultation bilaterally. Not using accessory muscles, speaking in full sentences. Left Shoulder: Inspection reveals no abnormalities, atrophy or asymmetry. Palpation is normal with no tenderness over AC joint or bicipital groove. ROM is full in all planes. Rotator cuff strength normal throughout. No signs of impingement with negative Neer and Hawkin's tests, empty can sign. Positive lift off test suggesting subscapularis injury. Positive speeds, negative Yergason test. No labral pathology noted with negative Obrien's, negative clunk and good stability. Normal scapular function observed. No painful arc and no drop arm sign. No apprehension sign  Impression and Recommendations:

## 2013-03-16 NOTE — Assessment & Plan Note (Signed)
Symptoms do represent a mild subscapularis stream. Continue Mobic, home rehabilitation given. Return in one month, injection if no better.

## 2013-04-10 ENCOUNTER — Encounter: Payer: Self-pay | Admitting: Sports Medicine

## 2013-04-10 ENCOUNTER — Ambulatory Visit (INDEPENDENT_AMBULATORY_CARE_PROVIDER_SITE_OTHER): Payer: 59 | Admitting: Sports Medicine

## 2013-04-10 VITALS — BP 161/88 | HR 78 | Ht 69.0 in | Wt 290.0 lb

## 2013-04-10 DIAGNOSIS — L989 Disorder of the skin and subcutaneous tissue, unspecified: Secondary | ICD-10-CM

## 2013-04-10 NOTE — Progress Notes (Signed)
  Subjective:    CC: Skin lesion  HPI: This pleasant 38 year old female comes in with a long history of a skin lesion on her right cheek. It is not itchy, not painful, but the parents does bother her significantly. Mild, persistent.  Past medical history, Surgical history, Family history not pertinant except as noted below, Social history, Allergies, and medications have been entered into the medical record, reviewed, and no changes needed.   Review of Systems: No fevers, chills, night sweats, weight loss, chest pain, or shortness of breath.   Objective:    General: Well Developed, well nourished, and in no acute distress.  Neuro: Alert and oriented x3, extra-ocular muscles intact, sensation grossly intact.  HEENT: Normocephalic, atraumatic, pupils equal round reactive to light, neck supple, no masses, no lymphadenopathy, thyroid nonpalpable.  Skin: Warm and dry, there is a 0.5 cm seborrheic keratosis on her right cheek, there is another lesion on the right cheek that appears umbilicated, approximately 0.5 cm across and appears to be a molluscum contagiosum. Cardiac: Regular rate and rhythm, no murmurs rubs or gallops, no lower extremity edema.  Respiratory: Clear to auscultation bilaterally. Not using accessory muscles, speaking in full sentences.  Procedure:  Cryodestruction of 2 benign skin lesions on the right cheek, approximately 0.5 cm each. Consent obtained and verified. Time-out conducted. Noted no overlying erythema, induration, or other signs of local infection. Completed without difficulty using Cryo-Gun. Advised to call if fevers/chills, erythema, induration, drainage, or persistent bleeding.  Impression and Recommendations:

## 2013-04-10 NOTE — Assessment & Plan Note (Addendum)
Cryo-destruction , 0.5 cm lesions on the right side of her face.

## 2013-05-07 ENCOUNTER — Ambulatory Visit (INDEPENDENT_AMBULATORY_CARE_PROVIDER_SITE_OTHER): Payer: 59 | Admitting: Sports Medicine

## 2013-05-07 ENCOUNTER — Encounter: Payer: Self-pay | Admitting: Sports Medicine

## 2013-05-07 VITALS — BP 155/91 | HR 78 | Ht 64.0 in | Wt 289.0 lb

## 2013-05-07 DIAGNOSIS — M5030 Other cervical disc degeneration, unspecified cervical region: Secondary | ICD-10-CM

## 2013-05-07 DIAGNOSIS — M25512 Pain in left shoulder: Secondary | ICD-10-CM

## 2013-05-07 DIAGNOSIS — M25519 Pain in unspecified shoulder: Secondary | ICD-10-CM

## 2013-05-07 DIAGNOSIS — IMO0001 Reserved for inherently not codable concepts without codable children: Secondary | ICD-10-CM

## 2013-05-07 NOTE — Progress Notes (Signed)
  Subjective:    CC: L Shoulder pain  HPI: Patient is a pleasant 38yo female who presents with left shoulder pain after a fall onto her left arm. She has a 2 month history of subscapularis tendonitis which has improved with exercise and ibuprofen. The fall has exacerbated the pain and the pain is worse with overhead movements. There is some numbness and tingling down her left arm which is unchanged from previously. Pain is moderate, persistent.  Past medical history, Surgical history, Family history not pertinant except as noted below, Social history, Allergies, and medications have been entered into the medical record, reviewed, and no changes needed.   Review of Systems: No fevers, chills, night sweats, weight loss, chest pain, or shortness of breath.   Objective:    General: Well Developed, well nourished, and in no acute distress.  Neuro: Alert and oriented x3, extra-ocular muscles intact, sensation grossly intact.  HEENT: Normocephalic, atraumatic, pupils equal round reactive to light, neck supple, no masses, no lymphadenopathy, thyroid nonpalpable.  Skin: Warm and dry, no rashes. Cardiac: Regular rate and rhythm, no murmurs rubs or gallops, no lower extremity edema.  Respiratory: Clear to auscultation bilaterally. Not using accessory muscles, speaking in full sentences. Left Shoulder: Non erythematous. Tender to palpation over the deltoid. Pain with external rotation. Positive empty can sign, neer sign, and hawkins sign.    Procedure: Real-time Ultrasound Guided Injection of left subacromial bursa  Device: GE Logiq E  Verbal informed consent obtained.  Time-out conducted.  Noted no overlying erythema, induration, or other signs of local infection.  Skin prepped in a sterile fashion.  Local anesthesia: Topical Ethyl chloride.  With sterile technique and under real time ultrasound guidance:  Noted distended subacromial bursa, 25-gauge needle advanced into bursa, 1 cc Kenalog 40, 3 cc  lidocaine injected easily. Completed without difficulty  Pain immediately resolved suggesting accurate placement of the medication.  Advised to call if fevers/chills, erythema, induration, drainage, or persistent bleeding.  Images permanently stored and available for review in the ultrasound unit.  Impression: Technically successful ultrasound guided injection.  Impression and Recommendations:

## 2013-05-07 NOTE — Assessment & Plan Note (Signed)
There has been a flare in her rotator cuff tendinitis. Subacromial injection as above, rehabilitation, return in one month.

## 2013-05-07 NOTE — Assessment & Plan Note (Signed)
Worsening left-sided radicular symptoms likely from abnormal biomechanics considering her concurrent rotator cuff injury. If this doesn't improve after injection of her shoulder and rehabilitation, we will proceed with MRI of her neck.

## 2013-09-18 ENCOUNTER — Ambulatory Visit (INDEPENDENT_AMBULATORY_CARE_PROVIDER_SITE_OTHER): Payer: 59 | Admitting: Sports Medicine

## 2013-09-18 ENCOUNTER — Encounter: Payer: Self-pay | Admitting: Sports Medicine

## 2013-09-18 VITALS — BP 149/93 | HR 88 | Ht 64.0 in | Wt 288.0 lb

## 2013-09-18 DIAGNOSIS — IMO0002 Reserved for concepts with insufficient information to code with codable children: Secondary | ICD-10-CM

## 2013-09-18 DIAGNOSIS — M7552 Bursitis of left shoulder: Secondary | ICD-10-CM

## 2013-09-18 DIAGNOSIS — M75100 Unspecified rotator cuff tear or rupture of unspecified shoulder, not specified as traumatic: Secondary | ICD-10-CM

## 2013-09-18 DIAGNOSIS — I10 Essential (primary) hypertension: Secondary | ICD-10-CM

## 2013-09-18 MED ORDER — HYDROCHLOROTHIAZIDE 25 MG PO TABS: 25.0000 mg | ORAL_TABLET | Freq: Every day | ORAL | Status: DC

## 2013-09-18 NOTE — Assessment & Plan Note (Addendum)
Starting 25 mg of hydrochlorothiazide.  Followup with PCP in one month.

## 2013-09-18 NOTE — Assessment & Plan Note (Signed)
Repeat left-sided injection today. Return as needed. Emphasized the importance of rotator cuff rehab exercises.

## 2013-09-18 NOTE — Progress Notes (Signed)
  Subjective:    CC: Left shoulder.  HPI: Left shoulder subacromial bursitis: Last injection was 4 months ago, had a good response with a recurrence of pain today, has not been doing her rehabilitation exercises. Pain is moderate, persistent.  Past medical history, Surgical history, Family history not pertinant except as noted below, Social history, Allergies, and medications have been entered into the medical record, reviewed, and no changes needed.   Review of Systems: No fevers, chills, night sweats, weight loss, chest pain, or shortness of breath.   Objective:    General: Well Developed, well nourished, and in no acute distress.  Neuro: Alert and oriented x3, extra-ocular muscles intact, sensation grossly intact.  HEENT: Normocephalic, atraumatic, pupils equal round reactive to light, neck supple, no masses, no lymphadenopathy, thyroid nonpalpable.  Skin: Warm and dry, no rashes. Cardiac: Regular rate and rhythm, no murmurs rubs or gallops, no lower extremity edema.  Respiratory: Clear to auscultation bilaterally. Not using accessory muscles, speaking in full sentences. Left Shoulder: Inspection reveals no abnormalities, atrophy or asymmetry. Palpation is normal with no tenderness over AC joint or bicipital groove. ROM is full in all planes. Rotator cuff strength normal throughout. Positive Neer and Hawkin's tests, empty can. Speeds and Yergason's tests normal. No labral pathology noted with negative Obrien's, negative crank, negative clunk, and good stability. Normal scapular function observed. No painful arc and no drop arm sign. No apprehension sign  Procedure: Real-time Ultrasound Guided Injection of left subacromial bursa Device: GE Logiq E  Verbal informed consent obtained.  Time-out conducted.  Noted no overlying erythema, induration, or other signs of local infection.  Skin prepped in a sterile fashion.  Local anesthesia: Topical Ethyl chloride.  With sterile  technique and under real time ultrasound guidance: Noted thickened subacromial bursa, 1 cc kenalog 40, 3 cc lidocaine injected easily.  Completed without difficulty  Pain immediately resolved suggesting accurate placement of the medication.  Advised to call if fevers/chills, erythema, induration, drainage, or persistent bleeding.  Images permanently stored and available for review in the ultrasound unit.  Impression: Technically successful ultrasound guided injection.  Impression and Recommendations:

## 2013-10-18 ENCOUNTER — Telehealth: Payer: Self-pay

## 2013-10-18 NOTE — Telephone Encounter (Signed)
Theresa Beard reports feeling dizzy and having increased heart rate about 20-30 minutes after taking the Hydrochlorothiazide. She has been feeling this way for the last week or so. She also reports feeling sluggish and having some anxiety. She did do a GAD-7 and she scored a 14. Please advise.   Blood pressure today; BP 149/94 - pulse 81 at 9 am BP 140/95 at 10 am

## 2013-10-18 NOTE — Telephone Encounter (Signed)
Thanks for the report, have her do 1/2 tab for a week and then 1 tab daily.  She needs to follow this up with PCP.  I will fwd to her as well.

## 2013-10-18 NOTE — Telephone Encounter (Addendum)
Patient advised.

## 2013-11-05 ENCOUNTER — Ambulatory Visit (INDEPENDENT_AMBULATORY_CARE_PROVIDER_SITE_OTHER): Payer: 59 | Admitting: Physician Assistant

## 2013-11-05 ENCOUNTER — Other Ambulatory Visit: Payer: Self-pay | Admitting: Physician Assistant

## 2013-11-05 ENCOUNTER — Encounter: Payer: Self-pay | Admitting: Physician Assistant

## 2013-11-05 VITALS — BP 143/82 | HR 76 | Ht 64.0 in | Wt 283.0 lb

## 2013-11-05 DIAGNOSIS — Z13220 Encounter for screening for lipoid disorders: Secondary | ICD-10-CM

## 2013-11-05 DIAGNOSIS — Q639 Congenital malformation of kidney, unspecified: Secondary | ICD-10-CM

## 2013-11-05 DIAGNOSIS — E669 Obesity, unspecified: Secondary | ICD-10-CM

## 2013-11-05 DIAGNOSIS — Q649 Congenital malformation of urinary system, unspecified: Secondary | ICD-10-CM

## 2013-11-05 DIAGNOSIS — I10 Essential (primary) hypertension: Secondary | ICD-10-CM

## 2013-11-05 DIAGNOSIS — Z131 Encounter for screening for diabetes mellitus: Secondary | ICD-10-CM

## 2013-11-05 DIAGNOSIS — F411 Generalized anxiety disorder: Secondary | ICD-10-CM

## 2013-11-05 MED ORDER — HYDROCHLOROTHIAZIDE 25 MG PO TABS: 25.0000 mg | ORAL_TABLET | Freq: Every day | ORAL | Status: DC

## 2013-11-05 MED ORDER — BUPROPION HCL ER (XL) 150 MG PO TB24: 150.0000 mg | ORAL_TABLET | Freq: Every day | ORAL | Status: DC

## 2013-11-05 NOTE — Patient Instructions (Signed)
Kidney ultrasound to follow up.

## 2013-11-05 NOTE — Progress Notes (Signed)
   Subjective:    Patient ID: Theresa Beard, female    DOB: [birth year], 38 y.o.   MRN: [id number]  HPI Patient is a 38 year old female who presents to the clinic to followup on blood pressure. Denies any CP, palpitations, vision changes or headaches. Patient does take HCTZ 25 mg daily. She needs refill today. She is actively working on losing weight. She has lost 5 pounds in the last 2 weeks. She has changed her diet to high protein and low car. She denies exercising regularly.  She is concerned because she feels like she has been getting much more anxious. Denies any suicidal or homicidal thoughts. She has never been on any medication to treat anxiety. This morning she was late for work and she felt like she was going to burst inside. She denies any problem with sleep.  She is currently working very hard to lose weight but does not successful. She's tried phentermine in the past and does ask about this. She's not had any other weight loss medications. She's not currently exercising.  Review of Systems  All other systems reviewed and are negative.      Objective:   Physical Exam  Constitutional: She is oriented to person, place, and time. She appears well-developed and well-nourished.  Obesity  HENT:  Head: Normocephalic and atraumatic.  Cardiovascular: Normal rate, regular rhythm and normal heart sounds.   Pulmonary/Chest: Effort normal and breath sounds normal.  Neurological: She is alert and oriented to person, place, and time.  Skin: Skin is dry.  Psychiatric: She has a normal mood and affect. Her behavior is normal.          Assessment & Plan:  HTN- BP borderline today. Recheck in office next door to make sure staying under 140//90 pt reports usually lower. Lost 5lbs in last 2 weeks has intentions of losing much more. Will continue to monitor. [facility]   Obesity- i do not want to re-start phentermine at this time with increased anxiety  and BP not completely controlled. Will start wellbutrin and see if she gets any weight loss benefits.   Anxiety- will start wellbutrin daily. Discussed side effects. May take 4-6 weeks to work for anxiety. Encouraged exercise for anxtiety and mood control. Follow up in 2 months. I did give some other labs to evaluate metabolic reasons for anxiety and obesity.  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 Will order follow up today.   Discussed need for CPE. Ordered fasting labs today.  Pt declines flu shot today. Will get when company offers it.

## 2013-11-07 ENCOUNTER — Encounter: Payer: Self-pay | Admitting: Physician Assistant

## 2013-11-07 DIAGNOSIS — E559 Vitamin D deficiency, unspecified: Secondary | ICD-10-CM | POA: Insufficient documentation

## 2013-11-07 DIAGNOSIS — D509 Iron deficiency anemia, unspecified: Secondary | ICD-10-CM | POA: Insufficient documentation

## 2013-11-07 LAB — COMPLETE METABOLIC PANEL WITH GFR
ALT: 35 U/L (ref 0–35)
AST: 29 U/L (ref 0–37)
Albumin: 4.3 g/dL (ref 3.5–5.2)
Alkaline Phosphatase: 62 U/L (ref 39–117)
BILIRUBIN TOTAL: 0.5 mg/dL (ref 0.2–1.2)
BUN: 11 mg/dL (ref 6–23)
CALCIUM: 9.8 mg/dL (ref 8.4–10.5)
CHLORIDE: 101 meq/L (ref 96–112)
CO2: 28 meq/L (ref 19–32)
CREATININE: 0.67 mg/dL (ref 0.50–1.10)
GFR, Est African American: >89 mL/min
GFR, Est Non African American: >89 mL/min
Glucose, Bld: 102 mg/dL — ABNORMAL HIGH (ref 70–99)
Potassium: 4.2 mEq/L (ref 3.5–5.3)
Sodium: 138 mEq/L (ref 135–145)
Total Protein: 7.5 g/dL (ref 6.0–8.3)

## 2013-11-07 LAB — CBC WITH DIFFERENTIAL/PLATELET
Basophils Absolute: 0.1 10*3/uL (ref 0.0–0.1)
Basophils Relative: 1 % (ref 0–1)
Eosinophils Absolute: 0.3 10*3/uL (ref 0.0–0.7)
Eosinophils Relative: 4 % (ref 0–5)
HCT: 37.4 % (ref 36.0–46.0)
Hemoglobin: 11.5 g/dL — ABNORMAL LOW (ref 12.0–15.0)
LYMPHS PCT: 30 % (ref 12–46)
Lymphs Abs: 2.4 10*3/uL (ref 0.7–4.0)
MCH: 20 pg — ABNORMAL LOW (ref 26.0–34.0)
MCHC: 30.7 g/dL (ref 30.0–36.0)
MCV: 65 fL — ABNORMAL LOW (ref 78.0–100.0)
Monocytes Absolute: 0.6 10*3/uL (ref 0.1–1.0)
Monocytes Relative: 7 % (ref 3–12)
Neutro Abs: 4.6 10*3/uL (ref 1.7–7.7)
Neutrophils Relative %: 58 % (ref 43–77)
PLATELETS: 514 10*3/uL — AB (ref 150–400)
RBC: 5.75 MIL/uL — ABNORMAL HIGH (ref 3.87–5.11)
RDW: 17.3 % — AB (ref 11.5–15.5)
WBC: 8 10*3/uL (ref 4.0–10.5)

## 2013-11-07 LAB — HEMOGLOBIN A1C
Hgb A1c MFr Bld: 6.6 % — ABNORMAL HIGH (ref <5.7)
Mean Plasma Glucose: 143 mg/dL — ABNORMAL HIGH (ref <117)

## 2013-11-07 LAB — LIPID PANEL
CHOLESTEROL: 170 mg/dL (ref 0–200)
HDL: 44 mg/dL (ref >39)
LDL Cholesterol: 106 mg/dL — ABNORMAL HIGH (ref 0–99)
TRIGLYCERIDES: 102 mg/dL (ref <150)
Total CHOL/HDL Ratio: 3.9 Ratio
VLDL: 20 mg/dL (ref 0–40)

## 2013-11-07 LAB — TSH: TSH: 3.96 u[IU]/mL (ref 0.350–4.500)

## 2013-11-07 LAB — FERRITIN: FERRITIN: 3 ng/mL — AB (ref 10–291)

## 2013-11-07 LAB — VITAMIN D 25 HYDROXY (VIT D DEFICIENCY, FRACTURES): VIT D 25 HYDROXY: 22 ng/mL — AB (ref 30–89)

## 2013-11-07 LAB — VITAMIN B12: VITAMIN B 12: 374 pg/mL (ref 211–911)

## 2013-11-13 ENCOUNTER — Other Ambulatory Visit: Payer: Self-pay | Admitting: Physician Assistant

## 2013-11-20 ENCOUNTER — Other Ambulatory Visit: Payer: Self-pay

## 2013-11-27 ENCOUNTER — Ambulatory Visit (INDEPENDENT_AMBULATORY_CARE_PROVIDER_SITE_OTHER): Payer: 59

## 2013-11-27 DIAGNOSIS — Q649 Congenital malformation of urinary system, unspecified: Secondary | ICD-10-CM

## 2013-11-27 DIAGNOSIS — Q639 Congenital malformation of kidney, unspecified: Secondary | ICD-10-CM

## 2014-01-30 IMAGING — CR DG KNEE COMPLETE 4+V*L*
3 series · 3 of 3 positions shown · non-contrast
Comparison: None.

CLINICAL DATA: Left knee pain for 1 month, no injury

LEFT KNEE - COMPLETE 4+ VIEW

[view not recorded (1 of 3)]
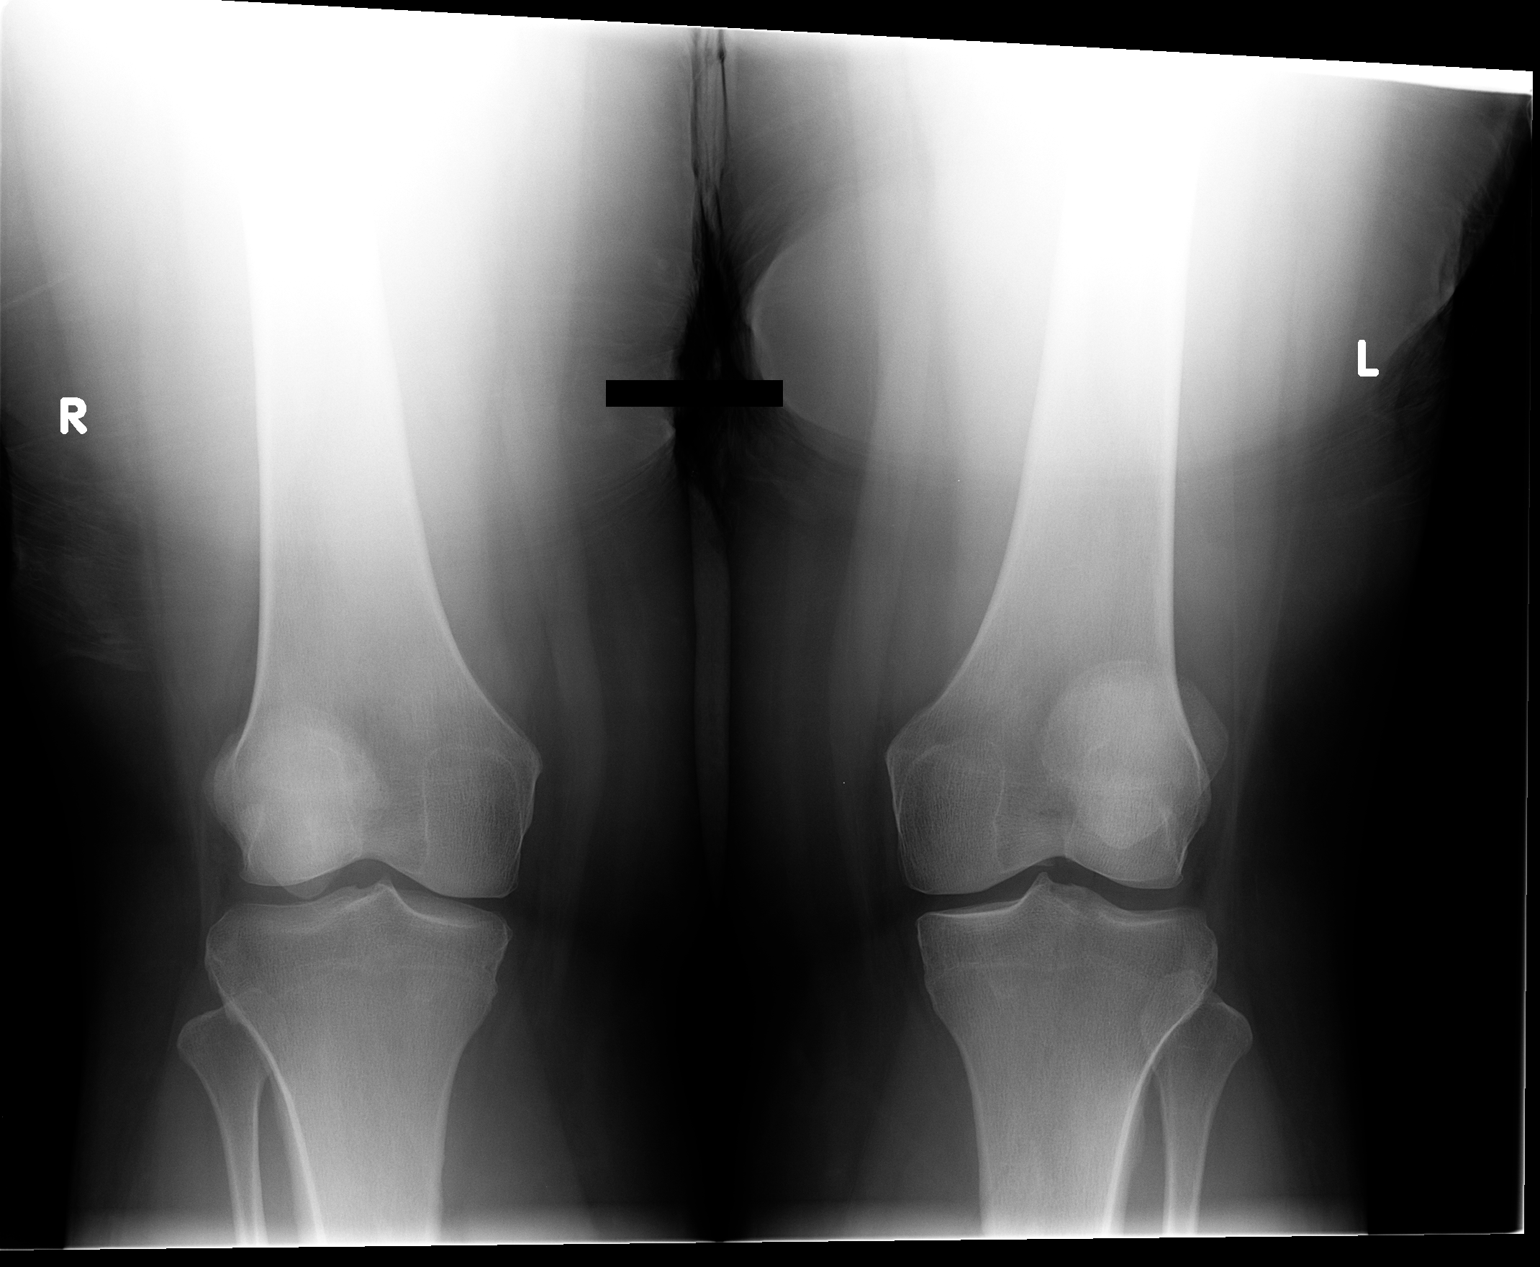

[view not recorded (2 of 3)]
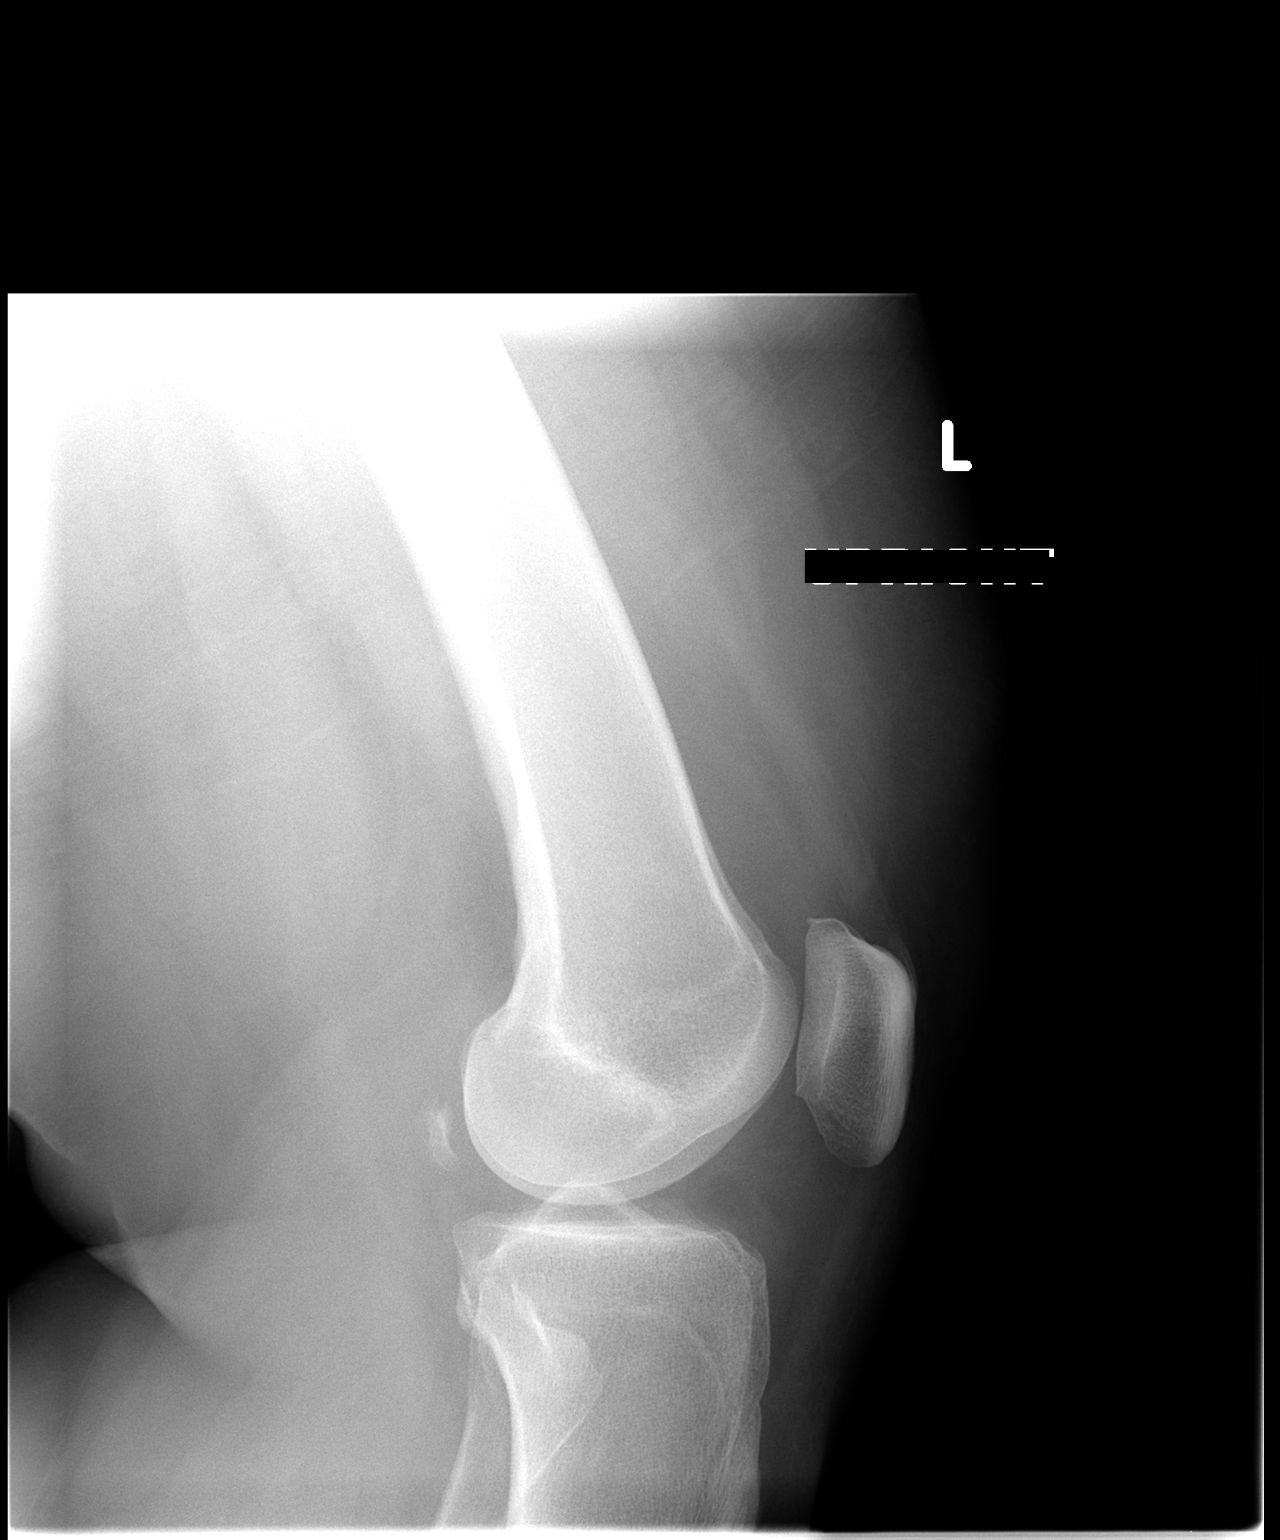

[view not recorded (3 of 3)]
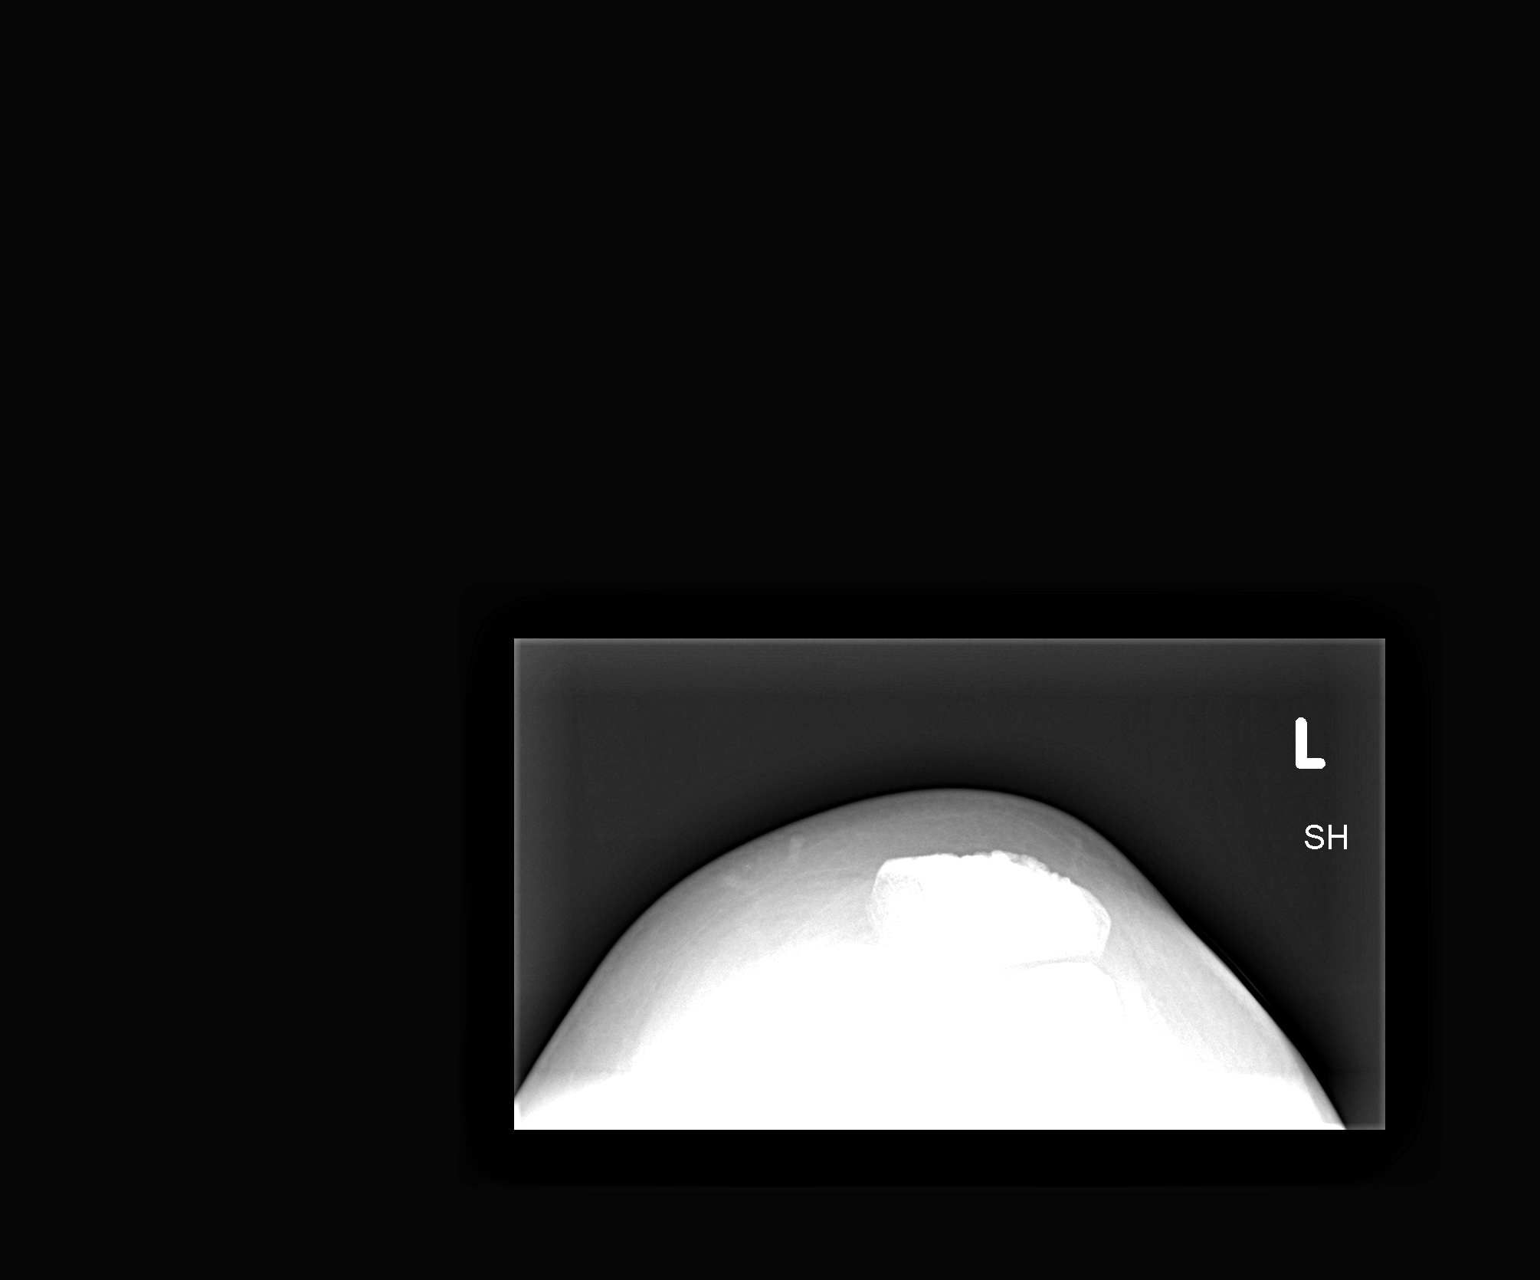

[3 of 3 positions shown; findings below may reference images not displayed]

FINDINGS: The left knee joint spaces appear normal.  No fracture is
seen.  No definite effusion is noted.  The patella is normally
positioned within the patellofemoral groove.
IMPRESSION: Negative.

## 2015-01-27 ENCOUNTER — Encounter: Payer: Self-pay | Admitting: Physician Assistant

## 2015-01-27 ENCOUNTER — Ambulatory Visit: Payer: Self-pay | Admitting: Physician Assistant

## 2015-01-27 ENCOUNTER — Ambulatory Visit (INDEPENDENT_AMBULATORY_CARE_PROVIDER_SITE_OTHER): Payer: 59 | Admitting: Physician Assistant

## 2015-01-27 VITALS — BP 149/82 | HR 87 | Ht 64.0 in | Wt 270.0 lb

## 2015-01-27 DIAGNOSIS — I10 Essential (primary) hypertension: Secondary | ICD-10-CM

## 2015-01-27 DIAGNOSIS — F411 Generalized anxiety disorder: Secondary | ICD-10-CM | POA: Diagnosis not present

## 2015-01-27 DIAGNOSIS — Z13220 Encounter for screening for lipoid disorders: Secondary | ICD-10-CM

## 2015-01-27 DIAGNOSIS — E119 Type 2 diabetes mellitus without complications: Secondary | ICD-10-CM

## 2015-01-27 DIAGNOSIS — G479 Sleep disorder, unspecified: Secondary | ICD-10-CM

## 2015-01-27 DIAGNOSIS — E669 Obesity, unspecified: Secondary | ICD-10-CM | POA: Diagnosis not present

## 2015-01-27 MED ORDER — RANITIDINE HCL 150 MG PO TABS: 150.0000 mg | ORAL_TABLET | Freq: Two times a day (BID) | ORAL | Status: DC

## 2015-01-27 MED ORDER — BUPROPION HCL ER (XL) 300 MG PO TB24: 300.0000 mg | ORAL_TABLET | Freq: Every day | ORAL | Status: DC

## 2015-01-27 MED ORDER — HYDROCHLOROTHIAZIDE 25 MG PO TABS: 25.0000 mg | ORAL_TABLET | Freq: Every day | ORAL | Status: DC

## 2015-01-27 NOTE — Progress Notes (Addendum)
   Subjective:    Patient ID: Theresa Beard, female    DOB: 12/13/1975, 39 y.o.   MRN: [id number]  HPI Patient is a 39 year old female who presents to the clinic to follow-up and get refills.  Hypertension- she reports to be taking her HCTZ daily. She needs refill. She denies any chest pains, palpitations, headaches or dizziness. 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 She does not like doctors office and always seems to be elevated here.   Anxiety -patient is taking Wellbutrin 150 mg a day. She has been out for the last 2 weeks and notices significant change in her mood. She does feel like the Wellbutrin is helping a lot with her anxiety. She still does not feel like she is where she wants to be but has improved. She also feels like she eats less on the Wellbutrin.    Last A1C showed diabetes. Pt declined medication. She wanted to work on weight loss and diet. She has not done so well with that. She denies any increased thirst, she has been urinating more frequently. No unhealed sores or wounds.     Review of Systems  All other systems reviewed and are negative.      Objective:   Physical Exam  Constitutional: She is oriented to person, place, and time. She appears well-developed and well-nourished.  HENT:  Head: Normocephalic and atraumatic.  Cardiovascular: Normal rate, regular rhythm and normal heart sounds.   Neurological: She is alert and oriented to person, place, and time.  Psychiatric: She has a normal mood and affect. Her behavior is normal.          Assessment & Plan:   hypertension-rechecked blood pressure and decrease a little but not to goal under 130/90 due to DM type II. Continue HCTZ. I fill like we need to add an ACE she feels like some of the elevation is due to her anxiety of the doctors visit. Will check in UC and send me over readings to analyze.   Anxiety- PHQ-9 was 5. GAD-7 was 8.  increased wellbutrin to 300mg  refill for 6 months.   DM- A!C ordered in blood. Pt not on therapy at this time. She was trying to work on weight loss and diet first. Discussed need for metformin if above 6.5. Pt aware. Will refer to nutritionist.   Fasting labs ordered.

## 2015-01-27 NOTE — Addendum Note (Signed)
Addended by: Jomarie LongsBREEBACK, Faiz Weber L on: 01/27/2015 04:50 PM   Modules accepted: Orders

## 2015-01-29 ENCOUNTER — Telehealth: Payer: Self-pay | Admitting: Physician Assistant

## 2015-01-29 ENCOUNTER — Other Ambulatory Visit: Payer: Self-pay | Admitting: Physician Assistant

## 2015-01-29 MED ORDER — LISINOPRIL-HYDROCHLOROTHIAZIDE 20-25 MG PO TABS: 1.0000 | ORAL_TABLET | Freq: Every day | ORAL | Status: DC

## 2015-01-29 NOTE — Progress Notes (Signed)
Pt's brings in BP checked by urgent care next door at 154/94. Will add lisinopril to HCTZ for BP control follow up in 1 month with nurse bp check.

## 2015-01-29 NOTE — Telephone Encounter (Signed)
Pt needs to go to [place] for nutrition appt. i saw that she was sent to [facility]. She is a [employer], [employer] employee.

## 2015-02-03 NOTE — Telephone Encounter (Signed)
I called [facility] and informed them patient needed a [place] facility. [facility] - [facility]

## 2015-02-06 ENCOUNTER — Emergency Department
Admission: EM | Admit: 2015-02-06 | Discharge: 2015-02-06 | Discharge status: Absent without leave | Payer: Self-pay | Source: Home / Self Care

## 2015-02-11 ENCOUNTER — Other Ambulatory Visit: Payer: Self-pay | Admitting: *Deleted

## 2015-02-11 MED ORDER — LOSARTAN POTASSIUM 50 MG PO TABS: 50.0000 mg | ORAL_TABLET | Freq: Every day | ORAL | Status: DC

## 2015-04-05 LAB — LIPID PANEL
CHOLESTEROL: 164 mg/dL (ref 125–200)
HDL: 38 mg/dL — AB (ref >=46)
LDL Cholesterol: 104 mg/dL (ref <130)
Total CHOL/HDL Ratio: 4.3 Ratio (ref <=5.0)
Triglycerides: 111 mg/dL (ref <150)
VLDL: 22 mg/dL (ref <30)

## 2015-04-05 LAB — COMPLETE METABOLIC PANEL WITH GFR
ALK PHOS: 54 U/L (ref 33–115)
ALT: 19 U/L (ref 6–29)
AST: 17 U/L (ref 10–30)
Albumin: 4 g/dL (ref 3.6–5.1)
BUN: 14 mg/dL (ref 7–25)
CO2: 28 mmol/L (ref 20–31)
Calcium: 9.3 mg/dL (ref 8.6–10.2)
Chloride: 104 mmol/L (ref 98–110)
Creat: 0.62 mg/dL (ref 0.50–1.10)
GFR, Est African American: >89 mL/min (ref >=60)
GFR, Est Non African American: >89 mL/min (ref >=60)
GLUCOSE: 93 mg/dL (ref 65–99)
Potassium: 4.3 mmol/L (ref 3.5–5.3)
SODIUM: 140 mmol/L (ref 135–146)
TOTAL PROTEIN: 7.1 g/dL (ref 6.1–8.1)
Total Bilirubin: 0.5 mg/dL (ref 0.2–1.2)

## 2015-04-05 LAB — HEMOGLOBIN A1C
Hgb A1c MFr Bld: 5.9 % — ABNORMAL HIGH (ref <5.7)
Mean Plasma Glucose: 123 mg/dL — ABNORMAL HIGH (ref <117)

## 2015-04-06 ENCOUNTER — Encounter: Payer: Self-pay | Admitting: Physician Assistant

## 2015-04-06 DIAGNOSIS — R7303 Prediabetes: Secondary | ICD-10-CM | POA: Insufficient documentation

## 2015-04-08 ENCOUNTER — Encounter: Payer: Self-pay | Admitting: Physician Assistant

## 2015-04-08 ENCOUNTER — Ambulatory Visit (INDEPENDENT_AMBULATORY_CARE_PROVIDER_SITE_OTHER): Payer: 59 | Admitting: Physician Assistant

## 2015-04-08 VITALS — BP 147/85 | HR 85 | Ht 64.0 in | Wt 262.0 lb

## 2015-04-08 DIAGNOSIS — F411 Generalized anxiety disorder: Secondary | ICD-10-CM

## 2015-04-08 DIAGNOSIS — D509 Iron deficiency anemia, unspecified: Secondary | ICD-10-CM | POA: Diagnosis not present

## 2015-04-08 DIAGNOSIS — I10 Essential (primary) hypertension: Secondary | ICD-10-CM

## 2015-04-08 DIAGNOSIS — Z1239 Encounter for other screening for malignant neoplasm of breast: Secondary | ICD-10-CM

## 2015-04-08 DIAGNOSIS — R7303 Prediabetes: Secondary | ICD-10-CM | POA: Diagnosis not present

## 2015-04-08 DIAGNOSIS — E669 Obesity, unspecified: Secondary | ICD-10-CM

## 2015-04-08 LAB — POCT HEMOGLOBIN: Hemoglobin: 11.6 g/dL — AB (ref 12.2–16.2)

## 2015-04-08 MED ORDER — FUSION PLUS PO CAPS: ORAL_CAPSULE | ORAL | Status: DC

## 2015-04-08 NOTE — Progress Notes (Signed)
   Subjective:    Patient ID: Theresa Beard, female    DOB: [birth year], 40 y.o.   MRN: [id number]  HPI Pt presents to the clinic for 3 month follow up.   HTN- taking cozaar  daily. No CP, palpitations, headaches or dizziness. She reports BP is under 140/90 when she checks outside of office.   IDA- not able to tolerate oral iron due to GI side effects. Continues to have heavy periods.   Anxiety- doing well. wellbutrin helps a lot.   Pt's a1c was 6.6 and was diabetic. Will recheck A!C today.     Review of Systems  All other systems reviewed and are negative.      Objective:   Physical Exam  Constitutional: She is oriented to person, place, and time. She appears well-developed and well-nourished.  Obese.   HENT:  Head: Normocephalic and atraumatic.  Cardiovascular: Normal rate, regular rhythm and normal heart sounds.   Pulmonary/Chest: Effort normal and breath sounds normal.  Neurological: She is alert and oriented to person, place, and time.  Psychiatric: She has a normal mood and affect. Her behavior is normal.          Assessment & Plan:  HTN- no refills needed. Continue to monitor BP and make sure under 140/90. Call with report. If not will increase cozaar to  daily. Follow up in 6 months.   IDA- POCT- 11.6. Not improved since last visit. Continues to have heavy periods. Will Try fusion since cannot tolerate oral iron. Sent to pharmacy.   Anxiety- controlled on wellbutrin. Does not need refill at this time.   Pre-diabetes- rechecked A1C and down from 6.6 to 5.9. Continue to lose weight. No medications. Work on diet and exercise. Follow up in 6 months.    Morbidly obese- BMI over 40. Lost almost 30lbs in one year. She continues to work on weight loss. She declines any medication.   Pt declined HIV and Pap smear. Pt has not been sexually active in 6 years.

## 2015-04-16 ENCOUNTER — Ambulatory Visit (INDEPENDENT_AMBULATORY_CARE_PROVIDER_SITE_OTHER): Payer: 59

## 2015-04-16 DIAGNOSIS — Z1231 Encounter for screening mammogram for malignant neoplasm of breast: Secondary | ICD-10-CM | POA: Diagnosis not present

## 2015-04-16 MED FILL — FUSION PLUS CAPSULE: 30 days supply | Qty: 30 | Fill #0

## 2015-04-21 ENCOUNTER — Ambulatory Visit (INDEPENDENT_AMBULATORY_CARE_PROVIDER_SITE_OTHER): Payer: 59 | Admitting: Family Medicine

## 2015-04-21 ENCOUNTER — Encounter: Payer: Self-pay | Admitting: Family Medicine

## 2015-04-21 VITALS — BP 150/95 | HR 83 | Wt 260.0 lb

## 2015-04-21 DIAGNOSIS — M25579 Pain in unspecified ankle and joints of unspecified foot: Secondary | ICD-10-CM

## 2015-04-21 NOTE — Progress Notes (Signed)
    Orthotics Note:   Patient was fitted for a : standard, cushioned, semi-rigid orthotic. The orthotic was heated and afterward the patient stood on the orthotic blank positioned on the orthotic stand. The patient was positioned in subtalar neutral position and 10 degrees of ankle dorsiflexion in a weight bearing stance. After completion of molding, a stable base was applied to the orthotic blank. The blank was ground to a stable position for weight bearing. Size: 10 Base: White EVA Additional Posting and Padding: None The patient ambulated these, and they were very comfortable.  I spent 40 minutes with this patient, greater than 50% was face-to-face time counseling regarding the below diagnosis.   

## 2015-04-21 NOTE — Patient Instructions (Signed)
Thank you for coming in today.   Return as needed.    

## 2015-04-22 ENCOUNTER — Encounter: Payer: Self-pay | Admitting: Family Medicine

## 2015-05-21 ENCOUNTER — Other Ambulatory Visit: Payer: Self-pay | Admitting: *Deleted

## 2015-05-21 MED ORDER — ESOMEPRAZOLE MAGNESIUM 40 MG PO CPDR: 40.0000 mg | DELAYED_RELEASE_CAPSULE | Freq: Every day | ORAL | Status: DC

## 2015-05-21 MED ORDER — BUPROPION HCL ER (XL) 300 MG PO TB24: 300.0000 mg | ORAL_TABLET | Freq: Every day | ORAL | Status: DC

## 2015-05-21 MED FILL — ESOMEPRAZOLE MAG DR 40 MG C: 40 | 90 days supply | Qty: 90 | Fill #0

## 2015-06-05 MED FILL — LOSARTAN POTASSIUM 50 MG TA: 50 | 90 days supply | Qty: 90 | Fill #1

## 2015-06-05 MED FILL — BUPROPION HCL XL 300 MG TAB: 300 | 90 days supply | Qty: 90 | Fill #1

## 2015-06-13 ENCOUNTER — Emergency Department
Admission: EM | Admit: 2015-06-13 | Discharge: 2015-06-13 | Discharge status: Absent without leave | Payer: Self-pay | Source: Home / Self Care

## 2015-06-13 ENCOUNTER — Emergency Department: Admission: EM | Admit: 2015-06-13 | Discharge: 2015-06-13 | Discharge status: Absent without leave | Payer: Self-pay

## 2015-07-11 ENCOUNTER — Encounter: Payer: Self-pay | Admitting: Physician Assistant

## 2015-07-11 ENCOUNTER — Ambulatory Visit (INDEPENDENT_AMBULATORY_CARE_PROVIDER_SITE_OTHER): Payer: 59 | Admitting: Physician Assistant

## 2015-07-11 DIAGNOSIS — R451 Restlessness and agitation: Secondary | ICD-10-CM

## 2015-07-11 DIAGNOSIS — Z6841 Body Mass Index (BMI) 40.0 and over, adult: Secondary | ICD-10-CM | POA: Insufficient documentation

## 2015-07-11 MED ORDER — LORCASERIN HCL 10 MG PO TABS: 1.0000 | ORAL_TABLET | Freq: Two times a day (BID) | ORAL | Status: DC

## 2015-07-11 NOTE — Progress Notes (Signed)
   Subjective:    Patient ID: Theresa Beard, female    DOB: 10/24/1975, 40 y.o.   MRN: [id number]  HPI Pt presents to the clinic to discuss medications. She feels like her agitation is worse with wellbutrin. Her sister tells her she is "mean". She feels like it helps with her cravings and anxiety. She would like to come off. She does not want to gain any weight. She has lost 30lbs and has at least 60lbs to go. She is interested in other weight loss options. She denies any CP, palpitations, headaches or dizziness.    Review of Systems  All other systems reviewed and are negative.      Objective:   Physical Exam  Constitutional: She is oriented to person, place, and time. She appears well-developed and well-nourished.  Obese.   HENT:  Head: Normocephalic and atraumatic.  Eyes: Conjunctivae are normal. Right eye exhibits no discharge. Left eye exhibits no discharge.  Cardiovascular: Normal rate, regular rhythm and normal heart sounds.   Pulmonary/Chest: Effort normal and breath sounds normal.  Neurological: She is alert and oriented to person, place, and time.  Psychiatric: She has a normal mood and affect. Her behavior is normal.          Assessment & Plan:  Morbid obesity/abnormal weight gain- will try belviq. Discussed side effects. Continue to exercise regularly and keep to a healthy diet no more than 1500 calories. Follow up in 3 months.    Agitation- likely a medication side effect from Wellbutrin. Discussed cutting Wellbutrin in half for next 6 weeks then stop. See if agitation improves.

## 2015-07-15 ENCOUNTER — Telehealth: Payer: Self-pay | Admitting: *Deleted

## 2015-07-15 NOTE — Telephone Encounter (Signed)
PA submitted through covermymeds Key: [id number] - PA Case ID: [id number]

## 2015-07-17 MED FILL — BELVIQ 10 MG TABLET: 10 | 30 days supply | Qty: 60 | Fill #0

## 2015-07-17 NOTE — Telephone Encounter (Signed)
belviq approved. Pt notified

## 2015-07-28 ENCOUNTER — Ambulatory Visit (INDEPENDENT_AMBULATORY_CARE_PROVIDER_SITE_OTHER): Payer: 59 | Admitting: Family Medicine

## 2015-07-28 ENCOUNTER — Ambulatory Visit (INDEPENDENT_AMBULATORY_CARE_PROVIDER_SITE_OTHER): Payer: 59

## 2015-07-28 ENCOUNTER — Ambulatory Visit: Payer: 59

## 2015-07-28 ENCOUNTER — Other Ambulatory Visit: Payer: Self-pay | Admitting: Family Medicine

## 2015-07-28 ENCOUNTER — Encounter: Payer: Self-pay | Admitting: Family Medicine

## 2015-07-28 ENCOUNTER — Ambulatory Visit (HOSPITAL_BASED_OUTPATIENT_CLINIC_OR_DEPARTMENT_OTHER)
Admission: RE | Admit: 2015-07-28 | Discharge: 2015-07-28 | Disposition: A | Discharge status: Home / Self Care | Payer: 59 | Source: Ambulatory Visit | Attending: Family Medicine | Admitting: Family Medicine

## 2015-07-28 VITALS — BP 127/93 | HR 76 | Wt 266.0 lb

## 2015-07-28 DIAGNOSIS — R69 Illness, unspecified: Secondary | ICD-10-CM

## 2015-07-28 DIAGNOSIS — M79645 Pain in left finger(s): Secondary | ICD-10-CM

## 2015-07-28 DIAGNOSIS — M25542 Pain in joints of left hand: Secondary | ICD-10-CM

## 2015-07-28 DIAGNOSIS — J111 Influenza due to unidentified influenza virus with other respiratory manifestations: Secondary | ICD-10-CM | POA: Diagnosis not present

## 2015-07-28 DIAGNOSIS — R52 Pain, unspecified: Secondary | ICD-10-CM

## 2015-07-28 MED ORDER — MELOXICAM 15 MG PO TABS: ORAL_TABLET | ORAL | Status: DC

## 2015-07-28 MED FILL — MELOXICAM 15 MG TABLET: 15 | 30 days supply | Qty: 30 | Fill #0

## 2015-07-28 NOTE — Patient Instructions (Signed)
Thank you for coming in today. Take meloxicam for pain as needed.  Use the cast/brace.  Return in 2-4 weeks for recheck.

## 2015-07-28 NOTE — Progress Notes (Signed)
   Subjective:     CC: Left thumb pain  HPI: Patient notes a 4 to five-day history of pain in the left thumb at the MCP [facility]. Pain is worse when she grabs an object. She cannot recall any injury. She's tried some over-the-counter medicines which helped a bit. No fevers chills nausea vomiting or diarrhea.  Past medical history, Surgical history, Family history not pertinant except as noted below, Social history, Allergies, and medications have been entered into the medical record, reviewed, and no changes needed.   Review of Systems: No headache, visual changes, nausea, vomiting, diarrhea, constipation, dizziness, abdominal pain, skin rash, fevers, chills, night sweats, weight loss, swollen lymph nodes, body aches, joint swelling, muscle aches, chest pain, shortness of breath, mood changes, visual or auditory hallucinations.   Objective:    Filed Vitals:   07/28/15 1301  BP: 127/93  Pulse: 76   General: Well Developed, well nourished, and in no acute distress.  Neuro/Psych: Alert and oriented x3, extra-ocular muscles intact, able to move all 4 extremities, sensation grossly intact. Skin: Warm and dry, no rashes noted.  Respiratory: Not using accessory muscles, speaking in full sentences, trachea midline.  Cardiovascular: Pulses palpable, no extremity edema. Abdomen: Does not appear distended. MSK: Left hand normal-appearing mildly tender  thumb MCP. No laxity with stress of ulnar collateral ligament. Normal foot motion pulses capillary refill and sensation.    Ultrasound left thumb:  The ulnar side of the thumb MCP visualized. The ulnar collateral ligament was partially visualized and appears to be grossly intact. There is some laxity with stress of the ulnar collateral ligament. However the adductor pollicis aponeurosis appears to glide smoothly with flexion and extension of the DIP. The ultrasound is somewhat consistent with ulnar collateral ligament tear without  Stener lesion  No results found for this or any previous visit (from the past 24 hour(s)). Dg Hand Complete Left  07/28/2015  CLINICAL DATA:  Several day history of left thumb pain without known injury EXAM: LEFT HAND - COMPLETE 3+ VIEW COMPARISON:  None in PACs FINDINGS: The bones of the left hand are adequately mineralized. There is no acute or healing fracture. The joint spaces are preserved. No erosive changes are demonstrated. Specific attention to the thumb reveals no acute abnormality. The first carpometacarpal joint exhibits no significant degenerative change. The soft tissues are unremarkable. IMPRESSION: There is no acute bony abnormality of the left thumb or remainder of the left hand. Electronically Signed   By: David  SwazilandJordan M.D.   On: 07/28/2015 15:35    Impression and Recommendations:   40 year old woman with thumb pain likely strain or overuse. Possible ulnar collateral ligament injury. Plan for Exos type short thumb spica splint. Recheck in 1-2 weeks. NSAIDs for pain control  This case required medical decision making of moderate complexity.  CC: Tandy GawJade Breeback, PA-C

## 2015-07-29 NOTE — Progress Notes (Signed)
Quick Note:  Xray normal. ______ 

## 2015-10-02 MED FILL — LOSARTAN POTASSIUM 50 MG TA: 50 | 90 days supply | Qty: 90 | Fill #2

## 2015-12-11 ENCOUNTER — Other Ambulatory Visit: Payer: Self-pay | Admitting: Family Medicine

## 2015-12-11 DIAGNOSIS — R300 Dysuria: Secondary | ICD-10-CM | POA: Diagnosis not present

## 2015-12-11 MED FILL — CEPHALEXIN 500 MG CAPSULE: 500 | 7 days supply | Qty: 14 | Fill #0

## 2015-12-13 LAB — URINE CULTURE: ORGANISM ID, BACTERIA: NO GROWTH

## 2016-01-09 ENCOUNTER — Other Ambulatory Visit: Payer: Self-pay | Admitting: Physician Assistant

## 2016-01-09 MED FILL — LOSARTAN POTASSIUM 50 MG TA: 50 | 90 days supply | Qty: 90 | Fill #3

## 2016-01-09 MED FILL — MELOXICAM 15 MG TABLET: 15 | 30 days supply | Qty: 30 | Fill #1

## 2016-01-09 MED FILL — ESOMEPRAZOLE MAG DR 40 MG C: 40 | 90 days supply | Qty: 90 | Fill #1

## 2016-01-09 MED FILL — BUPROPION HCL XL 300 MG TAB: 300 | 30 days supply | Qty: 30 | Fill #0

## 2016-06-18 ENCOUNTER — Other Ambulatory Visit: Payer: Self-pay | Admitting: *Deleted

## 2016-06-18 MED ORDER — LOSARTAN POTASSIUM 50 MG PO TABS: 50.0000 mg | ORAL_TABLET | Freq: Every day | ORAL | 0 refills | Status: DC

## 2016-06-18 MED FILL — LOSARTAN POTASSIUM 50 MG TA: 50 | 30 days supply | Qty: 30 | Fill #0

## 2016-06-29 ENCOUNTER — Ambulatory Visit (INDEPENDENT_AMBULATORY_CARE_PROVIDER_SITE_OTHER): Payer: 59 | Admitting: Physician Assistant

## 2016-06-29 ENCOUNTER — Encounter: Payer: Self-pay | Admitting: Physician Assistant

## 2016-06-29 VITALS — BP 132/82 | HR 77 | Ht 64.0 in | Wt 264.0 lb

## 2016-06-29 DIAGNOSIS — R7303 Prediabetes: Secondary | ICD-10-CM

## 2016-06-29 DIAGNOSIS — Z79899 Other long term (current) drug therapy: Secondary | ICD-10-CM

## 2016-06-29 DIAGNOSIS — I10 Essential (primary) hypertension: Secondary | ICD-10-CM

## 2016-06-29 LAB — COMPLETE METABOLIC PANEL WITH GFR
AG Ratio: 1.3 Ratio (ref 1.0–2.5)
ALBUMIN: 4.3 g/dL (ref 3.6–5.1)
ALT: 21 U/L (ref 6–29)
AST: 20 U/L (ref 10–30)
Alkaline Phosphatase: 64 U/L (ref 33–115)
BILIRUBIN TOTAL: 0.5 mg/dL (ref 0.2–1.2)
BUN/Creatinine Ratio: 15.4 Ratio (ref 6–22)
BUN: 10 mg/dL (ref 7–25)
CALCIUM: 10.2 mg/dL (ref 8.6–10.2)
CO2: 21 mmol/L (ref 20–31)
CREATININE: 0.65 mg/dL (ref 0.50–1.10)
Chloride: 105 mmol/L (ref 98–110)
GFR, Est African American: >89 mL/min (ref >=60)
GFR, Est Non African American: >89 mL/min (ref >=60)
GLOBULIN: 3.4 g/dL (ref 1.9–3.7)
GLUCOSE: 93 mg/dL (ref 65–99)
Potassium: 4.6 mmol/L (ref 3.5–5.3)
Sodium: 141 mmol/L (ref 135–146)
Total Protein: 7.7 g/dL (ref 6.1–8.1)

## 2016-06-29 MED ORDER — LOSARTAN POTASSIUM 50 MG PO TABS: 50.0000 mg | ORAL_TABLET | Freq: Every day | ORAL | 1 refills | Status: DC

## 2016-06-29 MED ORDER — BUPROPION HCL ER (XL) 150 MG PO TB24: 150.0000 mg | ORAL_TABLET | Freq: Every day | ORAL | 1 refills | Status: DC

## 2016-06-29 MED FILL — buPROPion HCL ER (XL) 150 M: 150 | 90 days supply | Qty: 90 | Fill #0

## 2016-06-29 NOTE — Progress Notes (Signed)
   Subjective:    Patient ID: Theresa Beard, female    DOB: 05-10-1975, 41 y.o.   MRN: [id number]  HPI  Pt is a 41 yo female who presents to the clinic for medication follow up. She is morbidly obese and does want to continue to lose weight. She has maintained at 266 for many months. She has made appt for obesity Clinic in [place] with Dr. Malena Edman. She admits she is eating "junk" and not exercising. She does feel like she is not craving as much with wellbutrin and maybe her mood a little better.   HTN- she denies any CP, palpitations, headaches, dizziness. She is taking her medication daily.    Review of Systems    see HPI.  Objective:   Physical Exam  Constitutional: She is oriented to person, place, and time. She appears well-developed and well-nourished.  Morbid obesity  HENT:  Head: Normocephalic and atraumatic.  Cardiovascular: Normal rate, regular rhythm and normal heart sounds.   Pulmonary/Chest: Effort normal and breath sounds normal.  Neurological: She is alert and oriented to person, place, and time.  Psychiatric: She has a normal mood and affect. Her behavior is normal.          Assessment & Plan:  .Marland KitchenJitruttana was seen today for hypertension, depression and anxiety.  Diagnoses and all orders for this visit:  Pre-diabetes -     Hemoglobin A1c -     COMPLETE METABOLIC PANEL WITH GFR  Medication management -     COMPLETE METABOLIC PANEL WITH GFR  Morbid obesity due to excess calories (HCC) -     buPROPion (WELLBUTRIN XL) 150 MG 24 hr tablet; Take 1 tablet (150 mg total) by mouth daily.  HYPERTENSION, MILD -     losartan (COZAAR) 50 MG tablet; Take 1 tablet (50 mg total) by mouth daily.     .. Depression screen [facility] 2/9 06/29/2016  Decreased Interest 0  Down, Depressed, Hopeless 2  PHQ - 2 Score 2  Altered sleeping 0  Tired, decreased energy 1  Change in appetite 2  Feeling bad or failure about yourself  0  Trouble concentrating 1  Moving slowly or  fidgety/restless 0  Suicidal thoughts 0  PHQ-9 Score 6   Follow up in 6 months for BP.  Strongly encouraged obesity clinic.  Marland KitchenMarland Kitchen

## 2016-06-30 LAB — HEMOGLOBIN A1C
Hgb A1c MFr Bld: 5.8 % — ABNORMAL HIGH (ref <5.7)
Mean Plasma Glucose: 120 mg/dL

## 2016-06-30 NOTE — Progress Notes (Signed)
Call pt: a1c is a tad better than one year ago.  Kidney, liver, glucose look great.

## 2016-07-01 ENCOUNTER — Encounter: Payer: Self-pay | Admitting: Physician Assistant

## 2016-07-28 MED FILL — LOSARTAN POTASSIUM 50 MG TA: 50 | 90 days supply | Qty: 90 | Fill #0

## 2016-11-11 MED FILL — MUPIROCIN 2% OINTMENT: 2 | 30 days supply | Qty: 22 | Fill #0

## 2016-12-09 MED FILL — buPROPion HCL ER (XL) 150 M: 150 | 90 days supply | Qty: 90 | Fill #1

## 2016-12-09 MED FILL — LOSARTAN POTASSIUM 50 MG TA: 50 | 90 days supply | Qty: 90 | Fill #1

## 2017-02-03 DIAGNOSIS — H524 Presbyopia: Secondary | ICD-10-CM | POA: Diagnosis not present

## 2017-02-03 DIAGNOSIS — H5213 Myopia, bilateral: Secondary | ICD-10-CM | POA: Diagnosis not present

## 2017-02-11 ENCOUNTER — Ambulatory Visit (INDEPENDENT_AMBULATORY_CARE_PROVIDER_SITE_OTHER): Payer: 59 | Admitting: Physician Assistant

## 2017-02-11 ENCOUNTER — Encounter: Payer: Self-pay | Admitting: Physician Assistant

## 2017-02-11 VITALS — BP 135/77 | HR 72 | Temp 98.7°F | Ht 64.0 in

## 2017-02-11 DIAGNOSIS — J0140 Acute pansinusitis, unspecified: Secondary | ICD-10-CM | POA: Diagnosis not present

## 2017-02-11 DIAGNOSIS — K219 Gastro-esophageal reflux disease without esophagitis: Secondary | ICD-10-CM

## 2017-02-11 MED ORDER — AMOXICILLIN-POT CLAVULANATE 875-125 MG PO TABS: 1.0000 | ORAL_TABLET | Freq: Two times a day (BID) | ORAL | 0 refills | Status: DC

## 2017-02-11 MED ORDER — IPRATROPIUM BROMIDE 0.06 % NA SOLN: 2.0000 | Freq: Four times a day (QID) | NASAL | 2 refills | Status: DC

## 2017-02-11 MED ORDER — ESOMEPRAZOLE MAGNESIUM 40 MG PO CPDR: 40.0000 mg | DELAYED_RELEASE_CAPSULE | Freq: Every day | ORAL | 1 refills | Status: DC

## 2017-02-11 MED FILL — AMOX-CLAV 875-125 MG TABLET: 875-125 | 10 days supply | Qty: 20 | Fill #0

## 2017-02-11 MED FILL — ESOMEPRAZOLE MAG DR 40 MG C: 40 | 90 days supply | Qty: 90 | Fill #0

## 2017-02-11 NOTE — Patient Instructions (Signed)

## 2017-02-11 NOTE — Progress Notes (Signed)
   Subjective:    Patient ID: Theresa Beard, female    DOB: 10/10/1975, 41 y.o.   MRN: [id number]  HPI Pt is a 41 yo female who presents to the clinic with 10 days of sinus pressure, cough, nasal drainage, fatigue, ear popping. She has tried nyquil, dayquil, coricdin, nasal sprays/saline, tylenol, ibuprofen. She has had little benefit. No fever. She has felt hot at times. Denies any SOB, wheezing.   Needs refill of nexium for acid reflux.controlled with medication.   .. Active Ambulatory Problems    Diagnosis Date Noted  . HYPERTENSION, MILD 12/22/2009  . 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 Cervical degenerative disc disease 11/05/2011  . Obesity 11/23/2011  . Acid reflux 01/11/2012  . 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 Subacromial bursitis 03/16/2013  . Skin lesion 04/10/2013  . Anxiety state 11/05/2013  . Anemia 11/07/2013  . Vitamin D insufficiency 11/07/2013  . Trouble in sleeping 01/27/2015  . Pre-diabetes 04/06/2015  . Morbid obesity due to excess calories (HCC) 07/11/2015  . Pain of left thumb 07/28/2015   Resolved Ambulatory Problems    Diagnosis Date Noted  . CHOLECYSTITIS - ACUTE 12/11/2009  . ABDOMINAL PAIN, ACUTE 12/11/2009  . Peroneal tendinitis of left lower extremity 11/23/2011  . Abnormality of gait 12/14/2011  . Influenza-like illness 04/21/2012  . Controlled type 2 diabetes mellitus without complication, without long-term current use of insulin (HCC) 01/27/2015   No Additional Past Medical History      Review of Systems    see HPI.  Objective:   Physical Exam  Constitutional: She is oriented to person, place, and time. She appears well-developed and well-nourished.  HENT:  Head: Normocephalic and atraumatic.  Right Ear: External ear normal.  Left Ear: External ear normal.  TM's erythematous and dull.  Tenderness over maxillary sinuses to palpation.  Bilateral nasal turbinates red and swollen.  Oropharynx  erythematous without tonsil swelling or exudate.   Eyes: Conjunctivae are normal.  Cardiovascular: Normal rate, regular rhythm and normal heart sounds.  Pulmonary/Chest: Effort normal and breath sounds normal. She has no wheezes.  Lymphadenopathy:    She has cervical adenopathy.  Neurological: She is alert and oriented to person, place, and time.  Psychiatric: She has a normal mood and affect. Her behavior is normal.          Assessment & Plan:  Marland Kitchen.Marland Kitchen.Diagnoses and all orders for this visit:  Acute non-recurrent pansinusitis -     amoxicillin-clavulanate (AUGMENTIN) 875-125 MG tablet; Take 1 tablet by mouth 2 (two) times daily. For 10 days. -     ipratropium (ATROVENT) 0.06 % nasal spray; Place 2 sprays into both nostrils 4 (four) times daily.  Gastroesophageal reflux disease, esophagitis presence not specified -     esomeprazole (NEXIUM) 40 MG capsule; Take 1 capsule (40 mg total) by mouth daily.  symptoms present for 10 days consistent with sinusitis. HO given. Symptomatic care discussed. atrovent nasal spray and augmentin. Follow up as needed.

## 2017-03-29 ENCOUNTER — Other Ambulatory Visit: Payer: Self-pay | Admitting: *Deleted

## 2017-03-29 ENCOUNTER — Telehealth: Payer: Self-pay | Admitting: *Deleted

## 2017-03-29 DIAGNOSIS — I10 Essential (primary) hypertension: Secondary | ICD-10-CM

## 2017-03-29 MED ORDER — LOSARTAN POTASSIUM 50 MG PO TABS: 50.0000 mg | ORAL_TABLET | Freq: Every day | ORAL | 0 refills | Status: DC

## 2017-03-29 MED ORDER — BUPROPION HCL ER (XL) 150 MG PO TB24: 150.0000 mg | ORAL_TABLET | Freq: Every day | ORAL | 0 refills | Status: DC

## 2017-03-29 MED FILL — LOSARTAN POTASSIUM 50 MG TA: 50 | 90 days supply | Qty: 90 | Fill #0

## 2017-03-29 MED FILL — buPROPion HCL ER (XL) 150 M: 150 | 90 days supply | Qty: 90 | Fill #0

## 2017-03-29 NOTE — Telephone Encounter (Signed)
Meds refilled.  Encouraged pt to schedule appointment.

## 2017-04-05 ENCOUNTER — Ambulatory Visit: Payer: Self-pay | Admitting: Physician Assistant

## 2017-04-06 ENCOUNTER — Encounter: Payer: Self-pay | Admitting: Physician Assistant

## 2017-04-06 ENCOUNTER — Ambulatory Visit (INDEPENDENT_AMBULATORY_CARE_PROVIDER_SITE_OTHER): Payer: No Typology Code available for payment source | Admitting: Physician Assistant

## 2017-04-06 VITALS — BP 136/86 | HR 89 | Ht 64.0 in | Wt 273.0 lb

## 2017-04-06 DIAGNOSIS — Z566 Other physical and mental strain related to work: Secondary | ICD-10-CM | POA: Diagnosis not present

## 2017-04-06 DIAGNOSIS — K219 Gastro-esophageal reflux disease without esophagitis: Secondary | ICD-10-CM

## 2017-04-06 DIAGNOSIS — F411 Generalized anxiety disorder: Secondary | ICD-10-CM

## 2017-04-06 DIAGNOSIS — R454 Irritability and anger: Secondary | ICD-10-CM

## 2017-04-06 DIAGNOSIS — I10 Essential (primary) hypertension: Secondary | ICD-10-CM | POA: Diagnosis not present

## 2017-04-06 MED ORDER — ESOMEPRAZOLE MAGNESIUM 40 MG PO CPDR: 40.0000 mg | DELAYED_RELEASE_CAPSULE | Freq: Every day | ORAL | 1 refills | Status: DC

## 2017-04-06 MED ORDER — ESCITALOPRAM OXALATE 5 MG PO TABS: 5.0000 mg | ORAL_TABLET | Freq: Every day | ORAL | 1 refills | Status: DC

## 2017-04-06 MED ORDER — LORCASERIN HCL ER 20 MG PO TB24: 1.0000 | ORAL_TABLET | Freq: Every day | ORAL | 2 refills | Status: DC

## 2017-04-06 MED FILL — ESCITALOPRAM 5 MG TABLET: 5 | 30 days supply | Qty: 30 | Fill #0

## 2017-04-06 NOTE — Progress Notes (Signed)
Subjective:    Patient ID: Theresa Beard, female    DOB: 08/19/1975, 42 y.o.   MRN: [id number]  HPI Pt is a 42 yo morbidly obese female with HTN who presents to the clinic for follow up.   Hypertension-patient is taking her losartan 50 mg daily.  She denies any chest pains, palpitations, headaches or vision changes.  Patient does not check her blood pressure at home.  She has had elevated readings in the office and when she has gone to work and recheck at work they have improved.  She admits that she is really stressed out at work.  Her anxiety and depression have definitely increased over the past few months.  She often feels very irritable and like she is going to lash out at patient's checking in.  She denies any suicidal ideations or homicidal thoughts.  She is on Wellbutrin 150 mg and has been for a while.  She is very frustrated with her weight.  She never got the belviq filled.  She is only tried phentermine in the past.  It caused her blood pressure to increase substantially.  She admits she is gone through periods of where she is exercising consistently and keeping a healthy diet but at this time she has slacked off.  .. Active Ambulatory Problems    Diagnosis Date Noted  . HYPERTENSION, MILD 12/22/2009  . 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 Cervical degenerative disc disease 11/05/2011  . Obesity 11/23/2011  . Acid reflux 01/11/2012  . 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 Anxiety state 11/05/2013  . Anemia 11/07/2013  . Vitamin D insufficiency 11/07/2013  . Trouble in sleeping 01/27/2015  . Pre-diabetes 04/06/2015  . Morbid obesity due to excess calories (HCC) 07/11/2015  . Pain of left thumb 07/28/2015  . Stress at work 04/06/2017  . Irritable 04/06/2017   Resolved Ambulatory Problems    Diagnosis Date Noted  . CHOLECYSTITIS - ACUTE 12/11/2009  . ABDOMINAL PAIN, ACUTE 12/11/2009  . Peroneal tendinitis of left lower extremity  11/23/2011  . Abnormality of gait 12/14/2011  . Influenza-like illness 04/21/2012  . Subacromial bursitis 03/16/2013  . Skin lesion 04/10/2013  . Controlled type 2 diabetes mellitus without complication, without long-term current use of insulin (HCC) 01/27/2015   No Additional Past Medical History      Review of Systems  All other systems reviewed and are negative.      Objective:   Physical Exam  Constitutional: She is oriented to person, place, and time. She appears well-developed and well-nourished.  Obese.   HENT:  Head: Normocephalic and atraumatic.  Neck: Normal range of motion. Neck supple.  Cardiovascular: Normal rate, regular rhythm and normal heart sounds.  Pulmonary/Chest: Effort normal and breath sounds normal. She has no wheezes.  Neurological: She is alert and oriented to person, place, and time.  Psychiatric: She has a normal mood and affect. Her behavior is normal.          Assessment & Plan:  .Marland Kitchen.Theresa Beard was seen today for hypertension, anxiety and obesity.  Diagnoses and all orders for this visit:  HYPERTENSION, MILD  Morbid obesity (HCC) -     Lorcaserin HCl ER (BELVIQ XR) 20 MG TB24; Take 1 tablet by mouth daily.  Stress at work -     escitalopram (LEXAPRO) 5 MG tablet; Take 1 tablet (5 mg total) by mouth daily.  Irritable -     escitalopram (LEXAPRO) 5 MG tablet; Take 1 tablet (5 mg total)  by mouth daily.  Anxiety state -     escitalopram (LEXAPRO) 5 MG tablet; Take 1 tablet (5 mg total) by mouth daily.  Gastroesophageal reflux disease, esophagitis presence not specified -     esomeprazole (NEXIUM) 40 MG capsule; Take 1 capsule (40 mg total) by mouth daily.   BP controlled on 2nd recheck. Refilled for 6 months.   .. Depression screen [facility] 2/9 04/06/2017 06/29/2016  Decreased Interest 3 0  Down, Depressed, Hopeless 1 2  PHQ - 2 Score 4 2  Altered sleeping 2 0  Tired, decreased energy 3 1  Change in appetite 3 2  Feeling bad or  failure about yourself  0 0  Trouble concentrating 0 1  Moving slowly or fidgety/restless 0 0  Suicidal thoughts 0 0  PHQ-9 Score 12 6  Difficult doing work/chores Very difficult -   .. GAD 7 : Generalized Anxiety Score 04/06/2017  Nervous, Anxious, on Edge 3  Control/stop worrying 1  Worry too much - different things 1  Trouble relaxing 3  Restless 0  Easily annoyed or irritable 1  Afraid - awful might happen 0  Total GAD 7 Score 9  Anxiety Difficulty Somewhat difficult    Added lexapro 5mg . Side effects discussed. Follow up in 6-8 weeks. Discussed counseling, relaxation techniques and exercise.   Restart belviq. Discussed side effects. Not a candidate for phentermine.  .Discussed low carb diet with 1500 calories and 80g of protein.  Exercising at least 150 minutes a week.  [employer] could be a Chief Technology Officer.    Marland Kitchen.Discussed low carb diet with 1500 calories and 80g of protein.  Exercising at least 150 minutes a week.  [employer] could be a Chief Technology Officer.

## 2017-04-07 ENCOUNTER — Telehealth: Payer: Self-pay | Admitting: Physician Assistant

## 2017-04-07 NOTE — Telephone Encounter (Signed)
Received fax for PA on Belviq sent through cover my meds waiting on determination. - CF °

## 2017-04-11 ENCOUNTER — Ambulatory Visit (INDEPENDENT_AMBULATORY_CARE_PROVIDER_SITE_OTHER): Payer: No Typology Code available for payment source | Admitting: Physician Assistant

## 2017-04-11 ENCOUNTER — Encounter: Payer: Self-pay | Admitting: Physician Assistant

## 2017-04-11 DIAGNOSIS — J0100 Acute maxillary sinusitis, unspecified: Secondary | ICD-10-CM

## 2017-04-11 MED ORDER — IPRATROPIUM BROMIDE 0.06 % NA SOLN: 2.0000 | Freq: Four times a day (QID) | NASAL | 0 refills | Status: DC

## 2017-04-11 MED ORDER — AMOXICILLIN-POT CLAVULANATE 875-125 MG PO TABS: 1.0000 | ORAL_TABLET | Freq: Two times a day (BID) | ORAL | 0 refills | Status: DC

## 2017-04-11 MED FILL — AMOX-CLAV 875-125 MG TABLET: 875-125 | 10 days supply | Qty: 20 | Fill #0

## 2017-04-11 MED FILL — IPRATROPIUM 0.06% SPRAY: 0.06 | 11 days supply | Qty: 15 | Fill #0

## 2017-04-11 NOTE — Patient Instructions (Signed)

## 2017-04-11 NOTE — Progress Notes (Signed)
   Subjective:    Patient ID: Theresa Beard, female    DOB: 02/15/1976, 42 y.o.   MRN: [id number]  HPI Pt is a 42 yo female who presents to the clinic with 6 days of sinus pressure, congestion, headache, productive cough. Today she feels like she is almost worst. She works in adjacent [employer] and going home today. No fever, chills, wheezing, or body aches. She is taking OTC mucinex and aleve with little benefit.   .. Active Ambulatory Problems    Diagnosis Date Noted  . HYPERTENSION, MILD 12/22/2009  . 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 Cervical degenerative disc disease 11/05/2011  . Obesity 11/23/2011  . Acid reflux 01/11/2012  . 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 Anxiety state 11/05/2013  . Anemia 11/07/2013  . Vitamin D insufficiency 11/07/2013  . Trouble in sleeping 01/27/2015  . Pre-diabetes 04/06/2015  . Morbid obesity due to excess calories (HCC) 07/11/2015  . Pain of left thumb 07/28/2015  . Stress at work 04/06/2017  . Irritable 04/06/2017   Resolved Ambulatory Problems    Diagnosis Date Noted  . CHOLECYSTITIS - ACUTE 12/11/2009  . ABDOMINAL PAIN, ACUTE 12/11/2009  . Peroneal tendinitis of left lower extremity 11/23/2011  . Abnormality of gait 12/14/2011  . Influenza-like illness 04/21/2012  . Subacromial bursitis 03/16/2013  . Skin lesion 04/10/2013  . Controlled type 2 diabetes mellitus without complication, without long-term current use of insulin (HCC) 01/27/2015   No Additional Past Medical History      Review of Systems  All other systems reviewed and are negative.      Objective:   Physical Exam  Constitutional: She is oriented to person, place, and time. She appears well-developed and well-nourished.  HENT:  Head: Normocephalic and atraumatic.  Mouth/Throat: Oropharynx is clear and moist.  TM's erythematous, dull and full.  Bilateral nasal turbinates red and swollen.  Tenderness over maxillary sinuses.    Eyes: Conjunctivae are normal.  Neck: Normal range of motion. Neck supple.  Cardiovascular: Normal rate, regular rhythm and normal heart sounds.  Pulmonary/Chest: Effort normal and breath sounds normal. She has no wheezes.  Lymphadenopathy:    She has cervical adenopathy.  Neurological: She is alert and oriented to person, place, and time.  Psychiatric: She has a normal mood and affect. Her behavior is normal.          Assessment & Plan:  Marland Kitchen.Marland Kitchen.Diagnoses and all orders for this visit:  Acute non-recurrent maxillary sinusitis -     amoxicillin-clavulanate (AUGMENTIN) 875-125 MG tablet; Take 1 tablet by mouth 2 (two) times daily. For 10 days. -     ipratropium (ATROVENT) 0.06 % nasal spray; Place 2 sprays into both nostrils 4 (four) times daily.   Treated with abx and atrovent. Discussed other symptomatic care. Stay at home today and rest and hydrate. Follow up as needed if no improvement.

## 2017-04-13 NOTE — Telephone Encounter (Signed)
Received fax from [employer] and Belviq is approved for 1 tablet per day up to 3 fills. I will notify the pharmacy. - CF  Valid 04/11/17 - 07/08/17

## 2017-05-12 MED FILL — ESCITALOPRAM 5 MG TABLET: 5 | 30 days supply | Qty: 30 | Fill #1

## 2017-05-12 MED FILL — BELVIQ XR 20 MG TABLET: 20 | 30 days supply | Qty: 30 | Fill #0

## 2017-06-09 ENCOUNTER — Other Ambulatory Visit: Payer: Self-pay | Admitting: *Deleted

## 2017-06-09 DIAGNOSIS — R454 Irritability and anger: Secondary | ICD-10-CM

## 2017-06-09 DIAGNOSIS — Z566 Other physical and mental strain related to work: Secondary | ICD-10-CM

## 2017-06-09 DIAGNOSIS — F411 Generalized anxiety disorder: Secondary | ICD-10-CM

## 2017-06-09 MED ORDER — ESCITALOPRAM OXALATE 5 MG PO TABS: 5.0000 mg | ORAL_TABLET | Freq: Every day | ORAL | 0 refills | Status: DC

## 2017-06-09 MED FILL — ESCITALOPRAM 5 MG TABLET: 5 | 30 days supply | Qty: 30 | Fill #0

## 2017-06-17 ENCOUNTER — Encounter: Payer: Self-pay | Admitting: Physician Assistant

## 2017-06-17 ENCOUNTER — Encounter (INDEPENDENT_AMBULATORY_CARE_PROVIDER_SITE_OTHER): Payer: Self-pay

## 2017-06-17 ENCOUNTER — Ambulatory Visit (INDEPENDENT_AMBULATORY_CARE_PROVIDER_SITE_OTHER): Payer: No Typology Code available for payment source | Admitting: Physician Assistant

## 2017-06-17 VITALS — BP 132/84 | Ht 64.0 in | Wt 281.0 lb

## 2017-06-17 DIAGNOSIS — R454 Irritability and anger: Secondary | ICD-10-CM | POA: Diagnosis not present

## 2017-06-17 DIAGNOSIS — Z566 Other physical and mental strain related to work: Secondary | ICD-10-CM | POA: Diagnosis not present

## 2017-06-17 DIAGNOSIS — Z79899 Other long term (current) drug therapy: Secondary | ICD-10-CM

## 2017-06-17 DIAGNOSIS — I10 Essential (primary) hypertension: Secondary | ICD-10-CM

## 2017-06-17 DIAGNOSIS — F411 Generalized anxiety disorder: Secondary | ICD-10-CM | POA: Diagnosis not present

## 2017-06-17 MED ORDER — ESCITALOPRAM OXALATE 5 MG PO TABS: 5.0000 mg | ORAL_TABLET | Freq: Every day | ORAL | 1 refills | Status: DC

## 2017-06-17 MED ORDER — LOSARTAN POTASSIUM 50 MG PO TABS: 50.0000 mg | ORAL_TABLET | Freq: Every day | ORAL | 1 refills | Status: DC

## 2017-06-17 NOTE — Progress Notes (Deleted)
   Subjective:    Patient ID: Theresa Beard, female    DOB: 03/22/1975, [age] y.o.   MRN: [id number]  HPI belviq aproved 281   Review of Systems     Objective:   Physical Exam        Assessment & Plan:    .Marland Kitchen. [facility] 04/06/2017 06/29/2016  Decreased Interest 3 0  Down, Depressed, Hopeless 1 2  PHQ - 2 Score 4 2  Altered sleeping 2 0  Tired, decreased energy 3 1  Change in appetite 3 2  Feeling bad or failure about yourself  0 0  Trouble concentrating 0 1  Moving slowly or fidgety/restless 0 0  Suicidal thoughts 0 0  PHQ-9 Score 12 6  Difficult doing work/chores Very difficult -   .. GAD 7 : Generalized Anxiety Score 04/06/2017  Nervous, Anxious, on Edge 3  Control/stop worrying 1  Worry too much - different things 1  Trouble relaxing 3  Restless 0  Easily annoyed or irritable 1  Afraid - awful might happen 0  Total GAD 7 Score 9  Anxiety Difficulty Somewhat difficult

## 2017-06-17 NOTE — Progress Notes (Signed)
Subjective:    Patient ID: Theresa Beard, female    DOB: 06/19/1975, 42 y.o.   MRN: [id number]  HPI Theresa Beard presents today for a medication follow up. She is following up regarding her anxiety and depression. She has been taking Lexapro 5mg  and Wellbutrin 150mg  and has felt a lot better. She states that at times she still has anxiety for no apparent reason. She states her depression is improving as well. She has been working on sorting a few things out in life and has a [employer] membership set up and has a plan to start working out with her mother as a work out partner. She HAS NOT started taking the Belviq. She denies any side effects from the Lexapro including any nausea, vomiting, fatigue, insomnia.   .. Active Ambulatory Problems    Diagnosis Date Noted  . HYPERTENSION, MILD 12/22/2009  . 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 Cervical degenerative disc disease 11/05/2011  . Obesity 11/23/2011  . Acid reflux 01/11/2012  . 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 Anxiety state 11/05/2013  . Anemia 11/07/2013  . Vitamin D insufficiency 11/07/2013  . Trouble in sleeping 01/27/2015  . Pre-diabetes 04/06/2015  . Morbid obesity due to excess calories (HCC) 07/11/2015  . Pain of left thumb 07/28/2015  . Stress at work 04/06/2017  . Irritable 04/06/2017   Resolved Ambulatory Problems    Diagnosis Date Noted  . CHOLECYSTITIS - ACUTE 12/11/2009  . ABDOMINAL PAIN, ACUTE 12/11/2009  . Peroneal tendinitis of left lower extremity 11/23/2011  . Abnormality of gait 12/14/2011  . Influenza-like illness 04/21/2012  . Subacromial bursitis 03/16/2013  . Skin lesion 04/10/2013  . Controlled type 2 diabetes mellitus without complication, without long-term current use of insulin (HCC) 01/27/2015   No Additional Past Medical History      Review of Systems  Constitutional: Negative for chills, fatigue and fever.  Respiratory: Negative for cough and shortness of  breath.   Cardiovascular: Negative for chest pain and palpitations.  Gastrointestinal: Negative for nausea and vomiting.  Psychiatric/Behavioral: Negative for sleep disturbance and suicidal ideas. The patient is nervous/anxious.        Objective:   Physical Exam  Constitutional: She is oriented to person, place, and time. She appears well-developed and well-nourished. No distress.  HENT:  Head: Normocephalic and atraumatic.  Eyes: Conjunctivae are normal.  Neck: Normal range of motion.  Cardiovascular: Normal rate, regular rhythm and normal heart sounds.  Pulmonary/Chest: Effort normal. No respiratory distress. She has no wheezes.  Neurological: She is alert and oriented to person, place, and time.  Skin: Skin is warm and dry.  Psychiatric: She has a normal mood and affect. Her behavior is normal.  Nursing note and vitals reviewed.         Assessment & Plan:  .Marland Kitchen.Theresa Beard was seen today for anxiety and depression.  Diagnoses and all orders for this visit:  Stress at work -     escitalopram (LEXAPRO) 5 MG tablet; Take 1 tablet (5 mg total) by mouth daily.  Medication management -     COMPLETE METABOLIC PANEL WITH GFR  Irritable -     escitalopram (LEXAPRO) 5 MG tablet; Take 1 tablet (5 mg total) by mouth daily.  Anxiety state -     escitalopram (LEXAPRO) 5 MG tablet; Take 1 tablet (5 mg total) by mouth daily.  HYPERTENSION, MILD -     losartan (COZAAR) 50 MG tablet; Take 1 tablet (50 mg total) by mouth daily.  Morbid obesity due to excess calories (HCC)   . Depression screen [facility] 2/9 06/17/2017 04/06/2017 06/29/2016  Decreased Interest 1 3 0  Down, Depressed, Hopeless 0 1 2  PHQ - 2 Score 1 4 2   Altered sleeping 0 2 0  Tired, decreased energy 1 3 1   Change in appetite 2 3 2   Feeling bad or failure about yourself  0 0 0  Trouble concentrating 0 0 1  Moving slowly or fidgety/restless 0 0 0  Suicidal thoughts 0 0 0  PHQ-9 Score 4 12 6   Difficult doing work/chores  Somewhat difficult Very difficult -   .. GAD 7 : Generalized Anxiety Score 06/17/2017 04/06/2017  Nervous, Anxious, on Edge 1 3  Control/stop worrying 1 1  Worry too much - different things 1 1  Trouble relaxing 0 3  Restless 0 0  Easily annoyed or irritable 0 1  Afraid - awful might happen 0 0  Total GAD 7 Score 3 9  Anxiety Difficulty Not difficult at all Somewhat difficult   Refilled lexapro. cmp ordered for medication management. Very encouraged by great improvement.   Now would like for patient to continue to work on weight loss. Start belviq. Discussed exercise and diet.  Follow upin 2-3 months.

## 2017-07-08 MED FILL — ESCITALOPRAM 5 MG TABLET: 5 | 90 days supply | Qty: 90 | Fill #0

## 2017-07-08 MED FILL — LOSARTAN POTASSIUM 50 MG TA: 50 | 90 days supply | Qty: 90 | Fill #0

## 2017-07-19 ENCOUNTER — Other Ambulatory Visit: Payer: Self-pay | Admitting: Physician Assistant

## 2017-07-20 MED FILL — buPROPion HCL ER (XL) 150 M: 150 | 90 days supply | Qty: 90 | Fill #0

## 2017-08-31 ENCOUNTER — Encounter: Payer: Self-pay | Admitting: Physician Assistant

## 2017-08-31 ENCOUNTER — Ambulatory Visit (INDEPENDENT_AMBULATORY_CARE_PROVIDER_SITE_OTHER): Payer: No Typology Code available for payment source | Admitting: Physician Assistant

## 2017-08-31 VITALS — BP 140/90 | HR 77 | Wt 272.0 lb

## 2017-08-31 DIAGNOSIS — Z13220 Encounter for screening for lipoid disorders: Secondary | ICD-10-CM | POA: Diagnosis not present

## 2017-08-31 DIAGNOSIS — Z131 Encounter for screening for diabetes mellitus: Secondary | ICD-10-CM | POA: Diagnosis not present

## 2017-08-31 DIAGNOSIS — M7702 Medial epicondylitis, left elbow: Secondary | ICD-10-CM

## 2017-08-31 DIAGNOSIS — Z0000 Encounter for general adult medical examination without abnormal findings: Secondary | ICD-10-CM | POA: Diagnosis not present

## 2017-08-31 DIAGNOSIS — D509 Iron deficiency anemia, unspecified: Secondary | ICD-10-CM

## 2017-08-31 DIAGNOSIS — F411 Generalized anxiety disorder: Secondary | ICD-10-CM

## 2017-08-31 DIAGNOSIS — Z566 Other physical and mental strain related to work: Secondary | ICD-10-CM

## 2017-08-31 DIAGNOSIS — E559 Vitamin D deficiency, unspecified: Secondary | ICD-10-CM

## 2017-08-31 DIAGNOSIS — R7303 Prediabetes: Secondary | ICD-10-CM | POA: Diagnosis not present

## 2017-08-31 DIAGNOSIS — R7989 Other specified abnormal findings of blood chemistry: Secondary | ICD-10-CM

## 2017-08-31 DIAGNOSIS — I10 Essential (primary) hypertension: Secondary | ICD-10-CM

## 2017-08-31 DIAGNOSIS — L918 Other hypertrophic disorders of the skin: Secondary | ICD-10-CM | POA: Diagnosis not present

## 2017-08-31 MED ORDER — ESCITALOPRAM OXALATE 10 MG PO TABS: 10.0000 mg | ORAL_TABLET | Freq: Every day | ORAL | 0 refills | Status: DC

## 2017-08-31 MED FILL — ESCITALOPRAM 10 MG TABLET: 10 | 90 days supply | Qty: 90 | Fill #0

## 2017-08-31 NOTE — Patient Instructions (Signed)

## 2017-08-31 NOTE — Progress Notes (Signed)
Subjective:     Theresa Beard is a 42 y.o. female and is here for a comprehensive physical exam. The patient reports problems - she is having some medial pain of left elbow. started a few weeks ago. not done anything to make better. rest makes better. flares at work. she also has a skin tag on her left upper abdomen she would like removed today. .  Social History   Socioeconomic History  . Marital status: Single    Spouse name: Not on file  . Number of children: Not on file  . Years of education: Not on file  . Highest education level: Not on file  Occupational History  . Not on file  Social Needs  . Financial resource strain: Not on file  . Food insecurity:    Worry: Not on file    Inability: Not on file  . Transportation needs:    Medical: Not on file    Non-medical: Not on file  Tobacco Use  . Smoking status: Never Smoker  . Smokeless tobacco: Never Used  Substance and Sexual Activity  . Alcohol use: Yes  . Drug use: No  . Sexual activity: Not on file    Comment: no regular exercise, 2 caffeine drinks daily.  Lifestyle  . Physical activity:    Days per week: Not on file    Minutes per session: Not on file  . Stress: Not on file  Relationships  . Social connections:    Talks on phone: Not on file    Gets together: Not on file    Attends religious service: Not on file    Active member of club or organization: Not on file    Attends meetings of clubs or organizations: Not on file    Relationship status: Not on file  . Intimate partner violence:    Fear of current or ex partner: Not on file    Emotionally abused: Not on file    Physically abused: Not on file    Forced sexual activity: Not on file  Other Topics Concern  . Not on file  Social History Narrative  . Not on file   Health Maintenance  Topic Date Due  . PAP SMEAR  03/08/2014  . HIV Screening  09/01/2018 (Originally 05/14/1989)  . INFLUENZA VACCINE  10/06/2017  . TETANUS/TDAP  03/08/2018     The following portions of the patient's history were reviewed and updated as appropriate: allergies, current medications, past family history, past medical history, past social history, past surgical history and problem list.  Review of Systems Pertinent items noted in HPI and remainder of comprehensive ROS otherwise negative.   Objective:    BP 140/90 (BP Location: Right Arm)   Pulse 77   Wt 272 lb (123.4 kg)   SpO2 100%   BMI 46.69 kg/m  General appearance: alert, cooperative, appears stated age and morbidly obese Head: Normocephalic, without obvious abnormality, atraumatic Eyes: conjunctivae/corneas clear. PERRL, EOM's intact. Fundi benign. Ears: normal TM's and external ear canals both ears Nose: Nares normal. Septum midline. Mucosa normal. No drainage or sinus tenderness. Throat: lips, mucosa, and tongue normal; teeth and gums normal Neck: no adenopathy, no carotid bruit, no JVD, supple, symmetrical, trachea midline and thyroid not enlarged, symmetric, no tenderness/mass/nodules Back: symmetric, no curvature. ROM normal. No CVA tenderness. Lungs: clear to auscultation bilaterally Heart: regular rate and rhythm, S1, S2 normal, no murmur, click, rub or gallop Abdomen: soft, non-tender; bowel sounds normal; no masses,  no organomegaly  Extremities: extremities normal, atraumatic, no cyanosis or edema Pulses: 2+ and symmetric Skin: Skin color, texture, turgor normal. No rashes or lesions Lymph nodes: Cervical, supraclavicular, and axillary nodes normal. Neurologic: Alert and oriented X 3, normal strength and tone. Normal symmetric reflexes. Normal coordination and gait    Assessment:    Healthy female exam.      Plan:    ...Theresa Beard was seen today for annual exam.  Diagnoses and all orders for this visit:  Routine physical examination -     Lipid Panel w/reflex Direct LDL -     COMPLETE METABOLIC PANEL WITH GFR -     CBC with Differential/Platelet -     Hemoglobin  A1c -     VITAMIN D 25 Hydroxy (Vit-D Deficiency, Fractures) -     B12 -     Ferritin  Screening for lipid disorders -     Lipid Panel w/reflex Direct LDL  Screening for diabetes mellitus -     COMPLETE METABOLIC PANEL WITH GFR  Pre-diabetes -     Hemoglobin A1c  Medial epicondylitis, left  Acquired skin tag  Anxiety state -     escitalopram (LEXAPRO) 10 MG tablet; Take 1 tablet (10 mg total) by mouth daily.  Stress at work -     escitalopram (LEXAPRO) 10 MG tablet; Take 1 tablet (10 mg total) by mouth daily.  HYPERTENSION, MILD   .Marland Kitchen Depression screen [facility] 2/9 08/31/2017 06/17/2017 04/06/2017 06/29/2016  Decreased Interest 0 1 3 0  Down, Depressed, Hopeless 0 0 1 2  PHQ - 2 Score 0 1 4 2   Altered sleeping 0 0 2 0  Tired, decreased energy 1 1 3 1   Change in appetite 1 2 3 2   Feeling bad or failure about yourself  0 0 0 0  Trouble concentrating 0 0 0 1  Moving slowly or fidgety/restless 0 0 0 0  Suicidal thoughts 0 0 0 0  PHQ-9 Score 2 4 12 6   Difficult doing work/chores Not difficult at all Somewhat difficult Very difficult -   .. GAD 7 : Generalized Anxiety Score 08/31/2017 06/17/2017 04/06/2017  Nervous, Anxious, on Edge 1 1 3   Control/stop worrying 0 1 1  Worry too much - different things 0 1 1  Trouble relaxing 0 0 3  Restless 0 0 0  Easily annoyed or irritable 1 0 1  Afraid - awful might happen 0 0 0  Total GAD 7 Score 2 3 9   Anxiety Difficulty Somewhat difficult Not difficult at all Somewhat difficult    .Marland Kitchen Discussed 150 minutes of exercise a week.  Encouraged vitamin D 1000 units and Calcium 1300mg  or 4 servings of dairy a day.  Fasting labs ordered.  Discussed goal of weight loss. Strongly encouraged her to start belviq and making this a priority.   Pt requested to up her lexapro. Increased to 10mg  daily. Follow up in 6 months or as needed.   BP not to goal. Pt will monitor and call with results. May need to increase cozaar.   Placed in epicondylitis  strap. Appears to be medial epicondylitis. HO given. Discussed rest, ice, NSAID. Follow up with sports medicine if not improving.   Skin Tag Removal Procedure Note  Pre-operative Diagnosis: Classic skin tags (acrochordon)  Post-operative Diagnosis: Classic skin tags (acrochordon)  Locations:left upper abdomen.  Indications: irritation    Procedure Details  The risks (including bleeding and infection) and benefits of the procedure and Verbal informed consent obtained. Using sterile  iris scissors, multiple skin tags were snipped off at their bases after cleansing with Betadine.  Bleeding was controlled by pressure.   Findings: Pathognomonic benign lesions  not sent for pathological exam.  Condition: Stable  Complications: none.  Plan: 1. Instructed to keep the wounds dry and covered for 24-48h and clean thereafter. 2. Warning signs of infection were reviewed.   3. Recommended that the patient use OTC acetaminophen as needed for pain.  4. Return as needed. See After Visit Summary for Counseling Recommendations

## 2017-09-01 ENCOUNTER — Encounter: Payer: Self-pay | Admitting: Physician Assistant

## 2017-09-01 DIAGNOSIS — M7702 Medial epicondylitis, left elbow: Secondary | ICD-10-CM | POA: Insufficient documentation

## 2017-09-01 DIAGNOSIS — L918 Other hypertrophic disorders of the skin: Secondary | ICD-10-CM | POA: Insufficient documentation

## 2017-09-02 ENCOUNTER — Encounter: Payer: Self-pay | Admitting: Family Medicine

## 2017-09-02 ENCOUNTER — Ambulatory Visit (INDEPENDENT_AMBULATORY_CARE_PROVIDER_SITE_OTHER): Payer: No Typology Code available for payment source | Admitting: Family Medicine

## 2017-09-02 VITALS — BP 148/95 | HR 76 | Ht 64.02 in | Wt 272.0 lb

## 2017-09-02 DIAGNOSIS — I10 Essential (primary) hypertension: Secondary | ICD-10-CM

## 2017-09-02 DIAGNOSIS — M778 Other enthesopathies, not elsewhere classified: Secondary | ICD-10-CM

## 2017-09-02 DIAGNOSIS — M779 Enthesopathy, unspecified: Secondary | ICD-10-CM

## 2017-09-02 DIAGNOSIS — M722 Plantar fascial fibromatosis: Secondary | ICD-10-CM | POA: Diagnosis not present

## 2017-09-02 DIAGNOSIS — M25562 Pain in left knee: Secondary | ICD-10-CM | POA: Diagnosis not present

## 2017-09-02 MED ORDER — NITROGLYCERIN 0.2 MG/HR TD PT24: MEDICATED_PATCH | TRANSDERMAL | 1 refills | Status: DC

## 2017-09-02 MED FILL — NITROGLYCERIN 0.2 MG/HR PAT: 0.2 | 32 days supply | Qty: 8 | Fill #0

## 2017-09-02 NOTE — Patient Instructions (Signed)
Thank you for coming in today. Do the elbow exercises were you go from the elbow straight to the elbow bent position slowly.  Do the heel exercises go down slowly  Recheck with me as need.  Nitro patch for 3 months.   Keep me updated.   Nitroglycerin Protocol   Apply 1/4 nitroglycerin patch to affected area daily.  Change position of patch within the affected area every 24 hours.  You may experience a headache during the first 1-2 weeks of using the patch, these should subside.  If you experience headaches after beginning nitroglycerin patch treatment, you may take your preferred over the counter pain reliever.  Another side effect of the nitroglycerin patch is skin irritation or rash related to patch adhesive.  Please notify our office if you develop more severe headaches or rash, and stop the patch.  Tendon healing with nitroglycerin patch may require 12 to 24 weeks depending on the extent of injury.  Men should not use if taking Viagra, Cialis, or Levitra.   Do not use if you have migraines or rosacea.    Plantar Fasciitis Plantar fasciitis is a painful foot condition that affects the heel. It occurs when the band of tissue that connects the toes to the heel bone (plantar fascia) becomes irritated. This can happen after exercising too much or doing other repetitive activities (overuse injury). The pain from plantar fasciitis can range from mild irritation to severe pain that makes it difficult for you to walk or move. The pain is usually worse in the morning or after you have been sitting or lying down for a while. What are the causes? This condition may be caused by:  Standing for long periods of time.  Wearing shoes that do not fit.  Doing high-impact activities, including running, aerobics, and ballet.  Being overweight.  Having an abnormal way of walking (gait).  Having tight calf muscles.  Having high arches in your feet.  Starting a new athletic  activity.  What are the signs or symptoms? The main symptom of this condition is heel pain. Other symptoms include:  Pain that gets worse after activity or exercise.  Pain that is worse in the morning or after resting.  Pain that goes away after you walk for a few minutes.  How is this diagnosed? This condition may be diagnosed based on your signs and symptoms. Your health care provider will also do a physical exam to check for:  A tender area on the bottom of your foot.  A high arch in your foot.  Pain when you move your foot.  Difficulty moving your foot.  You may also need to have imaging studies to confirm the diagnosis. These can include:  X-rays.  Ultrasound.  MRI.  How is this treated? Treatment for plantar fasciitis depends on the severity of the condition. Your treatment may include:  Rest, ice, and over-the-counter pain medicines to manage your pain.  Exercises to stretch your calves and your plantar fascia.  A splint that holds your foot in a stretched, upward position while you sleep (night splint).  Physical therapy to relieve symptoms and prevent problems in the future.  Cortisone injections to relieve severe pain.  Extracorporeal shock wave therapy (ESWT) to stimulate damaged plantar fascia with electrical impulses. It is often used as a last resort before surgery.  Surgery, if other treatments have not worked after 12 months.  Follow these instructions at home:  Take medicines only as directed by your health care  provider.  Avoid activities that cause pain.  Roll the bottom of your foot over a bag of ice or a bottle of cold water. Do this for 20 minutes, 3-4 times a day.  Perform simple stretches as directed by your health care provider.  Try wearing athletic shoes with air-sole or gel-sole cushions or soft shoe inserts.  Wear a night splint while sleeping, if directed by your health care provider.  Keep all follow-up appointments with your  health care provider. How is this prevented?  Do not perform exercises or activities that cause heel pain.  Consider finding low-impact activities if you continue to have problems.  Lose weight if you need to. The best way to prevent plantar fasciitis is to avoid the activities that aggravate your plantar fascia. Contact a health care provider if:  Your symptoms do not go away after treatment with home care measures.  Your pain gets worse.  Your pain affects your ability to move or do your daily activities. This information is not intended to replace advice given to you by your health care provider. Make sure you discuss any questions you have with your health care provider. Document Released: 11/17/2000 Document Revised: 07/28/2015 Document Reviewed: 01/02/2014 Elsevier Interactive Patient Education  [employer]2018 [employer]

## 2017-09-02 NOTE — Progress Notes (Signed)
Theresa Beard is a 42 y.o. female who presents to [facility] today for  Left elbow pain, left knee pain, left plantar foot pain.  Left elbow pain: Patient notes a several month long history of pain in her left elbow.  She points to the triceps insertion to the olecranon as the area of pain.  She notes pain is worse with activity and better with rest.  She cannot recall any injury.  She denies significant radiating pain weakness or numbness.  She is tried some over-the-counter ibuprofen intermittently which helped a little.  Additionally patient has pain in the left plantar foot.  She has a history of plantar fasciitis and has had trials of conservative therapy in the past.  She notes that she has not really been doing the eccentric heel exercises recently.  She notes the pain worsened about a week ago without any injury.  She thinks she may have wore a different pair shoes that may have exacerbated her pain.  She has pain is quite obnoxious and does interfere with her walking normally.  She is tried over-the-counter medicines for this which have not helped much.  Additionally patient notes left knee pain.  She reports a history of possible meniscus injury in her left knee that was treated with watchful waiting.  She notes her knees popped and swelled up last week with normal activity.  She notes it was more painful but now is improving.  She points to the lateral knee as the area of tenderness.  She denies locking or catching and thinks the symptoms are improving.  She is tried over-the-counter medicines for this which helped a bit.    ROS:  As above  Exam:  BP (!) 148/95   Pulse 76   Ht 5' 4.02" (1.626 m)   Wt 272 lb (123.4 kg)   SpO2 99%   BMI 46.67 kg/m  General: Well Developed, well nourished, and in no acute distress.  Neuro/Psych: Alert and oriented x3, extra-ocular muscles intact, able to move all 4 extremities, sensation  grossly intact. Skin: Warm and dry, no rashes noted.  Respiratory: Not using accessory muscles, speaking in full sentences, trachea midline.  Cardiovascular: Pulses palpable, no extremity edema. Abdomen: Does not appear distended. MSK:  Left elbow normal-appearing no deformity or effusion erythema or ecchymosis. Range of motion full. Tender to palpation at the triceps insertion on the olecranon.  Nontender otherwise. Pain with resisted elbow extension. Strength is intact  Contralateral right elbow normal-appearing nontender normal motion normal strength  Left knee normal-appearing no effusion.  Tender palpation lateral joint line. Stable ligamentous exam.  Left foot normal-appearing no deformity. Tender to palpation medial plantar calcaneus. Foot motion pulses cap refill and sensation are intact.  Pulses are intact throughout.  Procedure: Real-time Ultrasound Guided Injection of left plantar fascia Device: GE Logiq E   Images permanently stored and available for review in the ultrasound unit. Verbal informed consent obtained.  Discussed risks and benefits of procedure. Warned about infection bleeding damage to structures skin hypopigmentation and fat atrophy among others. Patient expresses understanding and agreement Time-out conducted.   Noted no overlying erythema, induration, or other signs of local infection.   Skin prepped in a sterile fashion.   Local anesthesia: Topical Ethyl chloride.   With sterile technique and under real time ultrasound guidance:  40 mg of Kenalog and 2 mL of Marcaine injected easily.   Completed without difficulty   Pain immediately resolved suggesting accurate  placement of the medication.   Advised to call if fevers/chills, erythema, induration, drainage, or persistent bleeding.   Images permanently stored and available for review in the ultrasound unit.  Impression: Technically successful ultrasound guided injection.    Lab and Radiology  Results Limited musculoskeletal ultrasound of the left calcaneus triceps insertion.  No bony deformity or large tendon disruption present.  Mild increased Doppler activity.  Mild hypoechoic fluid tracking deep to the triceps tendon. Impression: Mild to moderate insertional triceps tendinopathy    Assessment and Plan: 42 y.o. female with  Left elbow pain: Insertional triceps tendinopathy: New issue today.  Plan for eccentric exercise and nitroglycerin patch protocol.  Recheck in 3 months or sooner if not doing well.  Exercises reviewed elastic band provided.  Left foot pain: Letter fasciitis exacerbation.  Plan for injection as above.  Patient had considerable symptom relief.  Plan to continue eccentric exercises and heel cup cushion.  Recheck if not doing well.  Left knee pain: Improving knee pain likely related to degenerative meniscus tear.  Pain is improving plan for watchful waiting.  Blood pressure elevated today.  Recommend recheck with PCP. No orders of the defined types were placed in this encounter.  Meds ordered this encounter  Medications  . nitroGLYCERIN (NITRODUR - DOSED IN MG/24 HR) 0.2 mg/hr patch    Sig: 1/4 patch to left elbow daily for tendonitis    Dispense:  30 patch    Refill:  1    Historical information moved to improve visibility of documentation.  No past medical history on file. Past Surgical History:  Procedure Laterality Date  . CHOLECYSTECTOMY    . wisdom teeth     Social History   Tobacco Use  . Smoking status: Never Smoker  . Smokeless tobacco: Never Used  Substance Use Topics  . Alcohol use: Yes   family history includes Diabetes in her mother.  Medications: Current Outpatient Medications  Medication Sig Dispense Refill  . buPROPion (WELLBUTRIN XL) 150 MG 24 hr tablet TAKE 1 TABLET (150 MG TOTAL) BY MOUTH DAILY. 90 tablet 0  . Cholecalciferol (VITAMIN D PO) Take by mouth.    . escitalopram (LEXAPRO) 10 MG tablet Take 1 tablet (10 mg  total) by mouth daily. 90 tablet 0  . esomeprazole (NEXIUM) 40 MG capsule Take 1 capsule (40 mg total) by mouth daily. 90 capsule 1  . ipratropium (ATROVENT) 0.06 % nasal spray Place 2 sprays into both nostrils 4 (four) times daily. 15 mL 0  . Lorcaserin HCl ER (BELVIQ XR) 20 MG TB24 Take 1 tablet by mouth daily. 30 tablet 2  . losartan (COZAAR) 50 MG tablet Take 1 tablet (50 mg total) by mouth daily. 90 tablet 1  . meloxicam (MOBIC) 15 MG tablet One tab PO qAM with breakfast for 2 weeks, then daily prn pain. 30 tablet 3  . nitroGLYCERIN (NITRODUR - DOSED IN MG/24 HR) 0.2 mg/hr patch 1/4 patch to left elbow daily for tendonitis 30 patch 1   No current facility-administered medications for this visit.    Allergies  Allergen Reactions  . Lisinopril     Cough       Discussed warning signs or symptoms. Please see discharge instructions. Patient expresses understanding.

## 2017-09-08 LAB — CBC WITH DIFFERENTIAL/PLATELET
BASOS ABS: 80 {cells}/uL (ref 0–200)
BASOS PCT: 0.7 %
EOS ABS: 137 {cells}/uL (ref 15–500)
Eosinophils Relative: 1.2 %
HCT: 32.3 % — ABNORMAL LOW (ref 35.0–45.0)
Hemoglobin: 9.3 g/dL — ABNORMAL LOW (ref 11.7–15.5)
Lymphs Abs: 3283 cells/uL (ref 850–3900)
MCH: 17.7 pg — AB (ref 27.0–33.0)
MCHC: 28.8 g/dL — AB (ref 32.0–36.0)
MCV: 61.6 fL — ABNORMAL LOW (ref 80.0–100.0)
MONOS PCT: 8.2 %
MPV: 9.7 fL (ref 7.5–12.5)
NEUTROS PCT: 61.1 %
Neutro Abs: 6965 cells/uL (ref 1500–7800)
PLATELETS: 609 10*3/uL — AB (ref 140–400)
RBC: 5.24 10*6/uL — ABNORMAL HIGH (ref 3.80–5.10)
RDW: 17.7 % — AB (ref 11.0–15.0)
TOTAL LYMPHOCYTE: 28.8 %
WBC: 11.4 10*3/uL — ABNORMAL HIGH (ref 3.8–10.8)
WBCMIX: 935 {cells}/uL (ref 200–950)

## 2017-09-08 LAB — COMPLETE METABOLIC PANEL WITH GFR
AG RATIO: 1.3 (calc) (ref 1.0–2.5)
ALBUMIN MSPROF: 4.1 g/dL (ref 3.6–5.1)
ALT: 18 U/L (ref 6–29)
AST: 16 U/L (ref 10–30)
Alkaline phosphatase (APISO): 64 U/L (ref 33–115)
BUN: 14 mg/dL (ref 7–25)
CO2: 25 mmol/L (ref 20–32)
Calcium: 9.2 mg/dL (ref 8.6–10.2)
Chloride: 104 mmol/L (ref 98–110)
Creat: 0.7 mg/dL (ref 0.50–1.10)
GFR, EST NON AFRICAN AMERICAN: 106 mL/min/{1.73_m2} (ref >=60)
GFR, Est African American: 123 mL/min/{1.73_m2} (ref >=60)
GLOBULIN: 3.1 g/dL (ref 1.9–3.7)
Glucose, Bld: 96 mg/dL (ref 65–99)
POTASSIUM: 4.5 mmol/L (ref 3.5–5.3)
SODIUM: 137 mmol/L (ref 135–146)
Total Bilirubin: 0.4 mg/dL (ref 0.2–1.2)
Total Protein: 7.2 g/dL (ref 6.1–8.1)

## 2017-09-08 LAB — CBC MORPHOLOGY

## 2017-09-08 LAB — LIPID PANEL W/REFLEX DIRECT LDL
CHOL/HDL RATIO: 3.1 (calc) (ref <5.0)
CHOLESTEROL: 167 mg/dL (ref <200)
HDL: 54 mg/dL (ref >50)
LDL CHOLESTEROL (CALC): 97 mg/dL
Non-HDL Cholesterol (Calc): 113 mg/dL (calc) (ref <130)
Triglycerides: 70 mg/dL (ref <150)

## 2017-09-08 LAB — HEMOGLOBIN A1C
EAG (MMOL/L): 7.1 (calc)
Hgb A1c MFr Bld: 6.1 % of total Hgb — ABNORMAL HIGH (ref <5.7)
Mean Plasma Glucose: 128 (calc)

## 2017-09-08 LAB — FERRITIN: Ferritin: 3 ng/mL — ABNORMAL LOW (ref 16–232)

## 2017-09-08 LAB — VITAMIN D 25 HYDROXY (VIT D DEFICIENCY, FRACTURES): Vit D, 25-Hydroxy: 13 ng/mL — ABNORMAL LOW (ref 30–100)

## 2017-09-08 LAB — VITAMIN B12: Vitamin B-12: 528 pg/mL (ref 200–1100)

## 2017-09-09 ENCOUNTER — Encounter: Payer: Self-pay | Admitting: Physician Assistant

## 2017-09-09 NOTE — Progress Notes (Signed)
Call pt: cholesterol is great. HDL improved which is wonderful.  Kidney and liver look good.  You are anemic appears like iron def anemia since ferritin is low too. You need iron replacement.  Vitamin d is really low. You need high dose vitamin d for 3 months then recheck.  b12 looks good.

## 2017-09-12 MED ORDER — FERROUS SULFATE 325 (65 FE) MG PO TBEC: 325.0000 mg | DELAYED_RELEASE_TABLET | Freq: Every day | ORAL | 1 refills | Status: DC

## 2017-09-12 MED ORDER — ERGOCALCIFEROL 1.25 MG (50000 UT) PO CAPS: 50000.0000 [IU] | ORAL_CAPSULE | ORAL | 0 refills | Status: DC

## 2017-09-12 MED FILL — VIT D2 1.25 MG (50,000 UNIT: 1.25 MG | 84 days supply | Qty: 12 | Fill #0

## 2017-09-12 MED FILL — FERROUS SULF EC 325 MG TAB: 325 (65 FE) | 100 days supply | Qty: 100 | Fill #0

## 2017-09-12 NOTE — Progress Notes (Signed)
Don't see that patient was called see above result note. Sent vitamin D and ferrous sulfate.

## 2017-09-12 NOTE — Addendum Note (Signed)
Addended byTandy Gaw: BREEBACK, JADE L on: 09/12/2017 08:00 AM   Modules accepted: Orders

## 2017-10-12 MED FILL — LOSARTAN POTASSIUM 50 MG TA: 50 | 90 days supply | Qty: 90 | Fill #1

## 2017-10-24 ENCOUNTER — Other Ambulatory Visit: Payer: Self-pay | Admitting: Physician Assistant

## 2017-10-24 MED FILL — buPROPion HCL ER (XL) 150 M: 150 | 90 days supply | Qty: 90 | Fill #0

## 2017-11-02 MED FILL — ESOMEPRAZOLE MAG DR 40 MG C: 40 | 90 days supply | Qty: 90 | Fill #1

## 2017-11-18 ENCOUNTER — Other Ambulatory Visit: Payer: Self-pay | Admitting: Physician Assistant

## 2017-11-18 DIAGNOSIS — F411 Generalized anxiety disorder: Secondary | ICD-10-CM

## 2017-11-18 DIAGNOSIS — Z566 Other physical and mental strain related to work: Secondary | ICD-10-CM

## 2017-11-18 MED ORDER — ESCITALOPRAM OXALATE 20 MG PO TABS: 20.0000 mg | ORAL_TABLET | Freq: Every day | ORAL | 1 refills | Status: DC

## 2017-11-18 MED FILL — ESCITALOPRAM 20 MG TABLET: 20 | 90 days supply | Qty: 90 | Fill #0

## 2017-11-18 NOTE — Telephone Encounter (Signed)
Routing to pcp for dosage change from 10 mg to 20 mg. Theresa Beard.Razi Hickle, Viann Shoveonya Lynetta, CMA

## 2018-01-16 ENCOUNTER — Other Ambulatory Visit: Payer: Self-pay | Admitting: Physician Assistant

## 2018-01-16 DIAGNOSIS — I10 Essential (primary) hypertension: Secondary | ICD-10-CM

## 2018-01-18 MED FILL — LOSARTAN POTASSIUM 50 MG TA: 50 | 90 days supply | Qty: 90 | Fill #0

## 2018-01-18 MED FILL — buPROPion HCL ER (XL) 150 M: 150 | 90 days supply | Qty: 90 | Fill #0

## 2018-02-07 ENCOUNTER — Telehealth: Payer: Self-pay | Admitting: Physician Assistant

## 2018-02-07 MED FILL — ESCITALOPRAM 20 MG TABLET: 20 | 90 days supply | Qty: 90 | Fill #1

## 2018-02-07 NOTE — Telephone Encounter (Signed)
Pt was taking 2 of her 20mg  lexapro instead of 2 of her 10mg . She may need a refill early.

## 2018-05-09 MED FILL — LOSARTAN POTASSIUM 50 MG TA: 50 | 90 days supply | Qty: 90 | Fill #1

## 2018-05-22 ENCOUNTER — Other Ambulatory Visit: Payer: Self-pay | Admitting: Physician Assistant

## 2018-05-22 MED ORDER — HYDROCODONE-HOMATROPINE 5-1.5 MG/5ML PO SYRP: 5.0000 mL | ORAL_SOLUTION | Freq: Three times a day (TID) | ORAL | 0 refills | Status: DC | PRN

## 2018-05-22 MED ORDER — AMOXICILLIN-POT CLAVULANATE 875-125 MG PO TABS: 1.0000 | ORAL_TABLET | Freq: Two times a day (BID) | ORAL | 0 refills | Status: DC

## 2018-05-22 MED FILL — AMOX-CLAV 875-125 MG TABLET: 875-125 | 10 days supply | Qty: 20 | Fill #0

## 2018-05-22 MED FILL — HYDROCODONE-HOMATROPINE SOL: 5-1.5 | 4 days supply | Qty: 60 | Fill #0

## 2018-05-22 NOTE — Progress Notes (Signed)
Pt calls in she works in [employer] [place] door. She has severe ST and cough for 4 days. Lots of sick exposures. Sent augmentin. No SOB, wheezing, fever, sinus pressure.

## 2018-05-25 MED FILL — VENTOLIN HFA 90 MCG INHALER: 108 (90 BAS | 25 days supply | Qty: 18 | Fill #0

## 2018-05-25 MED FILL — BENZONATATE 200 MG CAPS: 200 | 10 days supply | Qty: 30 | Fill #0

## 2018-07-07 ENCOUNTER — Ambulatory Visit (INDEPENDENT_AMBULATORY_CARE_PROVIDER_SITE_OTHER): Payer: No Typology Code available for payment source | Admitting: Family Medicine

## 2018-07-07 ENCOUNTER — Encounter: Payer: Self-pay | Admitting: Family Medicine

## 2018-07-07 VITALS — BP 133/88 | Temp 98.2°F | Ht 64.0 in | Wt 278.0 lb

## 2018-07-07 DIAGNOSIS — K219 Gastro-esophageal reflux disease without esophagitis: Secondary | ICD-10-CM

## 2018-07-07 DIAGNOSIS — D508 Other iron deficiency anemias: Secondary | ICD-10-CM | POA: Diagnosis not present

## 2018-07-07 DIAGNOSIS — I10 Essential (primary) hypertension: Secondary | ICD-10-CM | POA: Diagnosis not present

## 2018-07-07 DIAGNOSIS — F411 Generalized anxiety disorder: Secondary | ICD-10-CM

## 2018-07-07 MED ORDER — LOSARTAN POTASSIUM 50 MG PO TABS: 50.0000 mg | ORAL_TABLET | Freq: Every day | ORAL | 1 refills | Status: DC

## 2018-07-07 MED ORDER — BUPROPION HCL ER (XL) 150 MG PO TB24: 150.0000 mg | ORAL_TABLET | Freq: Every day | ORAL | 0 refills | Status: DC

## 2018-07-07 MED ORDER — ESCITALOPRAM OXALATE 20 MG PO TABS: 20.0000 mg | ORAL_TABLET | Freq: Every day | ORAL | 1 refills | Status: DC

## 2018-07-07 MED ORDER — ESOMEPRAZOLE MAGNESIUM 40 MG PO CPDR: 40.0000 mg | DELAYED_RELEASE_CAPSULE | Freq: Every day | ORAL | 1 refills | Status: DC

## 2018-07-07 MED FILL — ESOMEPRAZOLE MAG DR 40 MG C: 40 | 90 days supply | Qty: 90 | Fill #0

## 2018-07-07 MED FILL — ESCITALOPRAM 20 MG TABLET: 20 | 90 days supply | Qty: 90 | Fill #0

## 2018-07-07 MED FILL — buPROPion HCL ER (XL) 150 M: 150 | 90 days supply | Qty: 90 | Fill #0

## 2018-07-07 NOTE — Progress Notes (Signed)
Pt reports that she doesn't have any of her BP meds or wellbutrin and she's very anxious. She also mentioned that her iron has been low and feels that it may be time for this to be checked again.Laureen Ochs, Viann Shove, CMA

## 2018-07-07 NOTE — Progress Notes (Signed)
Virtual Visit via Video Note  I connected with Theresa Beard on 07/07/18 at  4:20 PM EDT by a video enabled telemedicine application and verified that I am speaking with the correct person using two identifiers.   I discussed the limitations of evaluation and management by telemedicine and the availability of in person appointments. The patient expressed understanding and agreed to proceed.  Subjective:    CC:   HPI: Hypertension- Pt denies chest pain, SOB, dizziness, or heart palpitations.  Taking meds as directed w/o problems.  Denies medication side effects.  No swelling.    F/U Anxiety - she has been on edge but has been out of her Lexapro for 2 weeks.  She has had the Wellbutrin so has not been out of that medication.  She just had a hard time getting an appointment.  F/U iron def anemia -ferritin a year ago was 3.  Has been crunching ice, but says that has actually gotten better.. She has been taking her flintstons. Couldn't tolerate the oral  iron.  She has been feeling better overall.  She does report a family history in her father of difficulty making red blood cells that was diagnosed in his later years.  GERD-she would like a refill on her Nexium.  She had stopped taking it for a while but then started eating a lot of spicy foods which she knows a trigger for her.  She is restarted her Nexium and got back on track but would like to have a refill of the medication.  Past medical history, Surgical history, Family history not pertinant except as noted below, Social history, Allergies, and medications have been entered into the medical record, reviewed, and corrections made.   Review of Systems: No fevers, chills, night sweats, weight loss, chest pain, or shortness of breath.   Objective:    General: Speaking clearly in complete sentences without any shortness of breath.  Alert and oriented x3.  Normal judgment. No apparent acute distress.  Well-groomed.    Impression and  Recommendations:    HTN -Home blood pressures okay.  We discussed that goal is to have a systolic under 130 so just want her to keep an eye on that and if she is seeing a lot of blood pressures above that we may need to adjust her regimen.  Anxiety  -we will send over new prescription for Lexapro to get her back on her medication.  If she is not feeling some relief with her increased symptoms and anxiety after getting back on the medication then please let us know.  Continue Wellbutrin.  Refill sent to pharmacy.  Microcytic anemia   -she has been taking Flintstones as she was unable to tolerate other oral iron tabs.  Due to recheck levels and can make adjustments at that time.  We discussed that I would like to see her ferritin to at least 40 if not above that.  GERD - refilled her nexium.        I discussed the assessment and treatment plan with the patient. The patient was provided an opportunity to ask questions and all were answered. The patient agreed with the plan and demonstrated an understanding of the instructions.   The patient was advised to call back or seek an in-person evaluation if the symptoms worsen or if the condition fails to improve as anticipated.   Nani Gasser, MD

## 2018-08-22 MED FILL — LOSARTAN POTASSIUM 50 MG TA: 50 | 90 days supply | Qty: 90 | Fill #0

## 2018-10-12 ENCOUNTER — Encounter: Payer: Self-pay | Admitting: Family Medicine

## 2018-10-12 ENCOUNTER — Ambulatory Visit (INDEPENDENT_AMBULATORY_CARE_PROVIDER_SITE_OTHER): Payer: No Typology Code available for payment source | Admitting: Family Medicine

## 2018-10-12 ENCOUNTER — Ambulatory Visit (INDEPENDENT_AMBULATORY_CARE_PROVIDER_SITE_OTHER): Payer: No Typology Code available for payment source

## 2018-10-12 ENCOUNTER — Other Ambulatory Visit: Payer: Self-pay

## 2018-10-12 VITALS — BP 136/84 | HR 83 | Temp 98.3°F | Resp 16 | Ht 64.0 in | Wt 278.0 lb

## 2018-10-12 DIAGNOSIS — M722 Plantar fascial fibromatosis: Secondary | ICD-10-CM | POA: Diagnosis not present

## 2018-10-12 DIAGNOSIS — B360 Pityriasis versicolor: Secondary | ICD-10-CM | POA: Diagnosis not present

## 2018-10-12 DIAGNOSIS — M79674 Pain in right toe(s): Secondary | ICD-10-CM

## 2018-10-12 MED ORDER — FLUCONAZOLE 150 MG PO TABS: 300.0000 mg | ORAL_TABLET | ORAL | 1 refills | Status: DC

## 2018-10-12 MED FILL — FLUCONAZOLE 150 MG TABS: 150 | 14 days supply | Qty: 4 | Fill #0

## 2018-10-12 NOTE — Patient Instructions (Signed)
Thank you for coming in today. Call or go to the ER if you develop a large red swollen joint with extreme pain or oozing puss.  Use the fluconazole pill 2 pills once a week for 2 weeks. Can repeat if needed for rash.  .Continue typical treatment for plantar fasciitis.  Get xray.    Tinea Versicolor  Tinea versicolor is a common fungal infection of the skin. It causes a rash that appears as light or dark patches on the skin. The rash most often occurs on the chest, back, neck, or upper arms. This condition is more common during warm weather. Other than affecting how your skin looks, tinea versicolor usually does not cause other problems. In most cases, the infection goes away in a few weeks with treatment. It may take a few months for the patches on your skin to return to your usual skin color. What are the causes? This condition occurs when a type of fungus that is normally present on the skin starts to overgrow. This fungus is a kind of yeast. The exact cause of the overgrowth is not known. This condition cannot be passed from one person to another (it is not contagious). What increases the risk? This condition is more likely to develop when certain factors are present, such as:  Heat and humidity.  Sweating too much.  Hormone changes.  Oily skin.  A weak disease-fighting system (immunesystem). What are the signs or symptoms? Symptoms of this condition include:  A rash of light or dark patches on your skin. The rash may have: ? Patches of tan or pink spots (on light skin). ? Patches of white or brown spots (on dark skin). ? Patches of skin that do not tan. ? Well-marked edges. ? Scales on the discolored areas.  Mild itching. How is this diagnosed? A health care provider can usually diagnose this condition by looking at your skin. During the exam, he or she may use ultraviolet (UV) light to see how much of your skin has been affected. In some cases, a skin sample may be taken by  scraping the rash. This sample will be viewed under a microscope to check for yeast overgrowth. How is this treated? Treatment for this condition may include:  Dandruff shampoo that is applied to the affected skin during showers or bathing.  Over-the-counter medicated skin cream, lotion, or soaps.  Prescription antifungal medicine in the form of skin cream or pills.  Medicine to help reduce itching. Follow these instructions at home:  Take over-the-counter and prescription medicines only as told by your health care provider.  Apply dandruff shampoo to the affected area if your health care provider told you to do that. You may be instructed to scrub the affected skin for several minutes each day.  Do not scratch the affected area of skin.  Avoid hot and humid conditions.  Do not use tanning booths.  Try to avoid sweating a lot. Contact a health care provider if:  Your symptoms get worse.  You have a fever.  You have redness, swelling, or pain at the site of your rash.  You have fluid or blood coming from your rash.  Your rash feels warm to the touch.  You have pus or a bad smell coming from your rash.  Your rash returns (recurs) after treatment. Summary  Tinea versicolor is a common fungal infection of the skin. It causes a rash that appears as light or dark patches on the skin.  The rash most  often occurs on the chest, back, neck, or upper arms.  A health care provider can usually diagnose this condition by looking at your skin.  Treatment may include applying shampoo to the skin and taking or applying medicines. This information is not intended to replace advice given to you by your health care provider. Make sure you discuss any questions you have with your health care provider. Document Released: 02/20/2000 Document Revised: 02/04/2017 Document Reviewed: 10/26/2016 Elsevier Patient Education  2020 [employer].

## 2018-10-12 NOTE — Progress Notes (Signed)
Theresa Beard is a 43 y.o. female who [facility] to [facility] today for right foot pain at plantar fascia. Has been present for 3-4 weeks.  Trying to treat with exercise ice and stretching.  She feels pretty well without any acute injury but notes that it is just been worsening over the last few weeks.  She notes that she stubbed her toe about 2 weeks ago and notes pain in her second and third toes.  She thinks that she is having to walk a little differently or use different footwear which may be affecting her plantar foot pain.  She has a history of plantar fasciitis of the left foot which is feeling pretty well now.  Also notes a rash on her right flank that is been present for about a week now.  She is tried some antifungal lotion which has not changed at all.  She notes is not very itchy.  She feels pretty well otherwise no fevers or chills.    ROS:  As above  Exam:  BP 136/84   Pulse 83   Temp 98.3 F (36.8 C) (Oral)   Resp 16   Ht 5\' 4"  (1.626 m)   Wt 278 lb (126.1 kg)   LMP 10/10/2018   BMI 47.72 kg/m  Wt Readings from Last 5 Encounters:  10/12/18 278 lb (126.1 kg)  07/07/18 278 lb (126.1 kg)  09/02/17 272 lb (123.4 kg)  08/31/17 272 lb (123.4 kg)  06/17/17 281 lb (127.5 kg)   General: Well Developed, well nourished, and in no acute distress.  Neuro/Psych: Alert and oriented x3, extra-ocular muscles intact, able to move all 4 extremities, sensation grossly intact. Skin: Warm and dry,.  Macular ovoid slightly brown lesions present on flank.  Nontender no scale or itching. Respiratory: Not using accessory muscles, speaking in full sentences, trachea midline.  Cardiovascular: Pulses palpable, no extremity edema. Abdomen: Does not appear distended. MSK:  Right foot: Slight bruising at second and third toe PIP.  Otherwise normal-appearing.  Mildly tender to palpation second and third toe.  Forefoot is otherwise nontender.  Plantar foot is tender to palpation at Plantar calcaneus.  Pulses cap refill and sensation are intact into the foot.    Lab and Radiology Results X-ray images right foot obtained today personally and independently reviewed. No acute fractures present.  Calcaneal spur present.  Await formal radiology over read.  Procedure: Real-time Ultrasound Guided Injection of right plantar calcaneus plantar fascia Device: GE Logiq E   Images permanently stored and available for review in the ultrasound unit. Verbal informed consent obtained.  Discussed risks and benefits of procedure. Warned about infection bleeding damage to structures skin hypopigmentation and fat atrophy among others. Patient expresses understanding and agreement Time-out conducted.   Noted no overlying erythema, induration, or other signs of local infection.   Skin prepped in a sterile fashion.   Local anesthesia: Topical Ethyl chloride.   With sterile technique and under real time ultrasound guidance:  40 mg of Depo-Medrol and 2 mL of Marcaine injected easily.   Completed without difficulty   Pain immediately resolved suggesting accurate placement of the medication.   Advised to call if fevers/chills, erythema, induration, drainage, or persistent bleeding.   Images permanently stored and available for review in the ultrasound unit.  Impression: Technically successful ultrasound guided injection.        Assessment and Plan: 43 y.o. female with  Right foot plantar fascial pain.  Likely plantar fasciitis.  Treated with injection today.  Plan to continue home exercise program and conservative measures.  Recheck as needed.  Toe pain: Stubbed toes however no visible fracture per my read today.  X-ray over read is still pending.  Plan for buddy tape and relative rest and recheck as needed.  Rash: Consistent appearance with tinea versicolor.  Treat with fluconazole.  Recheck as needed.   PDMP not reviewed this encounter.  Orders Placed This Encounter  Procedures  . DG Foot Complete Right    Standing Status:   Future    Number of Occurrences:   1    Standing Expiration Date:   12/12/2019    Order Specific Question:   Reason for Exam (SYMPTOM  OR DIAGNOSIS REQUIRED)    Answer:   pain right 2nd and 3rs toe stubbed    Order Specific Question:   Is patient pregnant?    Answer:   No    Order Specific Question:   Preferred imaging location?    Answer:   [facility]    Order Specific Question:   Radiology Contrast Protocol - do NOT remove file path    Answer:   [email or web address]   Meds ordered this encounter  Medications  . fluconazole (DIFLUCAN) 150 MG tablet    Sig: Take 2 tablets (300 mg total) by mouth once a week.    Dispense:  4 tablet    Refill:  1    Historical information moved to improve visibility of documentation.  No past medical history on file. Past Surgical History:  Procedure Laterality Date  . CHOLECYSTECTOMY    . wisdom teeth     Social History   Tobacco Use  . Smoking status: Never Smoker  . Smokeless tobacco: Never Used  Substance Use Topics  . Alcohol use: Yes   family history includes Diabetes in her mother.  Medications: Current Outpatient Medications  Medication Sig Dispense Refill  . buPROPion (WELLBUTRIN XL) 150 MG 24 hr tablet Take 1 tablet (150 mg total) by mouth daily. 90 tablet 0  . escitalopram (LEXAPRO) 20 MG tablet Take 1 tablet (20 mg total) by mouth daily. 90 tablet 1  . esomeprazole (NEXIUM) 40 MG capsule Take 1 capsule (40 mg total) by mouth daily. 90 capsule 1  . losartan (COZAAR) 50 MG tablet Take 1 tablet (50 mg total) by mouth daily. 90 tablet 1  . Pediatric Multivitamins-Iron (FLINTSTONES PLUS IRON PO) Take by mouth.    . fluconazole (DIFLUCAN) 150 MG tablet Take 2 tablets (300 mg total) by mouth once a week. 4 tablet 1   No current facility-administered medications for this visit.    Allergies   Allergen Reactions  . Lisinopril     Cough       Discussed warning signs or symptoms. Please see discharge instructions. Patient expresses understanding.

## 2018-11-07 ENCOUNTER — Other Ambulatory Visit: Payer: Self-pay | Admitting: Family Medicine

## 2018-11-07 MED ORDER — FLUCONAZOLE 150 MG PO TABS: 300.0000 mg | ORAL_TABLET | ORAL | 1 refills | Status: DC

## 2018-11-07 MED FILL — FLUCONAZOLE 150 MG TABS: 150 | 14 days supply | Qty: 4 | Fill #0

## 2018-11-07 NOTE — Telephone Encounter (Signed)
Patient was needing a refill sent to the pharmacy.

## 2018-11-20 MED FILL — FLUCONAZOLE 150 MG TABS: 150 | 14 days supply | Qty: 4 | Fill #0

## 2018-12-01 ENCOUNTER — Other Ambulatory Visit: Payer: Self-pay | Admitting: Physician Assistant

## 2018-12-01 DIAGNOSIS — F411 Generalized anxiety disorder: Secondary | ICD-10-CM

## 2018-12-01 MED FILL — buPROPion HCL ER (XL) 150 M: 150 | 90 days supply | Qty: 90 | Fill #0

## 2018-12-01 MED FILL — ESCITALOPRAM 20 MG TABLET: 20 | 90 days supply | Qty: 90 | Fill #1

## 2018-12-01 MED FILL — LOSARTAN POTASSIUM 50 MG TA: 50 | 90 days supply | Qty: 90 | Fill #1

## 2018-12-13 MED FILL — buPROPion HCL ER (XL) 150 M: 150 | 90 days supply | Qty: 90 | Fill #0

## 2018-12-13 MED FILL — ESCITALOPRAM 20 MG TABLET: 20 | 90 days supply | Qty: 90 | Fill #1

## 2018-12-13 MED FILL — LOSARTAN POTASSIUM 50 MG TA: 50 | 90 days supply | Qty: 90 | Fill #1

## 2019-04-11 ENCOUNTER — Other Ambulatory Visit: Payer: Self-pay | Admitting: Physician Assistant

## 2019-04-11 DIAGNOSIS — I10 Essential (primary) hypertension: Secondary | ICD-10-CM

## 2019-04-11 MED ORDER — LOSARTAN POTASSIUM 50 MG PO TABS: 50.0000 mg | ORAL_TABLET | Freq: Every day | ORAL | 1 refills | Status: DC

## 2019-04-11 MED FILL — LOSARTAN POTASSIUM 50 MG TA: 50 | 90 days supply | Qty: 90 | Fill #0

## 2019-04-11 NOTE — Telephone Encounter (Signed)
Patient needs a refill of BP medications. She has a follow up scheduled.

## 2019-04-18 ENCOUNTER — Ambulatory Visit: Payer: No Typology Code available for payment source | Admitting: Physician Assistant

## 2019-05-02 ENCOUNTER — Telehealth: Payer: Self-pay | Admitting: Physician Assistant

## 2019-05-02 DIAGNOSIS — E559 Vitamin D deficiency, unspecified: Secondary | ICD-10-CM

## 2019-05-02 DIAGNOSIS — Z0000 Encounter for general adult medical examination without abnormal findings: Secondary | ICD-10-CM

## 2019-05-02 DIAGNOSIS — Z13220 Encounter for screening for lipoid disorders: Secondary | ICD-10-CM

## 2019-05-02 DIAGNOSIS — D509 Iron deficiency anemia, unspecified: Secondary | ICD-10-CM

## 2019-05-02 DIAGNOSIS — Z131 Encounter for screening for diabetes mellitus: Secondary | ICD-10-CM

## 2019-05-02 NOTE — Telephone Encounter (Signed)
Patient has a follow up with you on 05/16/2019. She is wanting to know if you want fasting blood work done before the visit. Please advise. If so what labs do you want draw and what diagnoses codes?

## 2019-05-04 NOTE — Telephone Encounter (Signed)
I sent labs downstairs. Yes have drawn before appt.

## 2019-05-04 NOTE — Telephone Encounter (Signed)
Patient is aware and did not have any questions.  

## 2019-05-08 ENCOUNTER — Emergency Department: Admission: EM | Admit: 2019-05-08 | Discharge: 2019-05-08 | Discharge status: Absent without leave | Payer: Self-pay

## 2019-05-08 ENCOUNTER — Ambulatory Visit (INDEPENDENT_AMBULATORY_CARE_PROVIDER_SITE_OTHER): Payer: No Typology Code available for payment source | Admitting: Physician Assistant

## 2019-05-08 ENCOUNTER — Other Ambulatory Visit: Payer: Self-pay

## 2019-05-08 ENCOUNTER — Encounter: Payer: Self-pay | Admitting: Physician Assistant

## 2019-05-08 VITALS — BP 134/64 | HR 73 | Temp 98.7°F | Ht 64.0 in | Wt 292.0 lb

## 2019-05-08 DIAGNOSIS — L723 Sebaceous cyst: Secondary | ICD-10-CM | POA: Diagnosis not present

## 2019-05-08 MED ORDER — DOXYCYCLINE HYCLATE 100 MG PO TABS: 100.0000 mg | ORAL_TABLET | Freq: Two times a day (BID) | ORAL | 0 refills | Status: DC

## 2019-05-08 MED FILL — DOXYCYCLINE HYCLATE 100 MG: 100 | 10 days supply | Qty: 20 | Fill #0

## 2019-05-08 NOTE — Patient Instructions (Signed)
Incision and Drainage, Care After This sheet gives you information about how to care for yourself after your procedure. Your health care provider may also give you more specific instructions. If you have problems or questions, contact your health care provider. What can I expect after the procedure? After the procedure, it is common to have:  Pain or discomfort around the incision site.  Blood, fluid, or pus (drainage) from the incision.  Redness and firm skin around the incision site. Follow these instructions at home: Medicines  Take over-the-counter and prescription medicines only as told by your health care provider.  If you were prescribed an antibiotic medicine, use or take it as told by your health care provider. Do not stop using the antibiotic even if you start to feel better. Wound care Follow instructions from your health care provider about how to take care of your wound. Make sure you:  Wash your hands with soap and water before and after you change your bandage (dressing). If soap and water are not available, use hand sanitizer.  Change your dressing and packing as told by your health care provider. ? If your dressing is dry or stuck when you try to remove it, moisten or wet the dressing with saline or water so that it can be removed without harming your skin or tissues. ? If your wound is packed, leave it in place until your health care provider tells you to remove it. To remove the packing, moisten or wet the packing with saline or water so that it can be removed without harming your skin or tissues.  Leave stitches (sutures), skin glue, or adhesive strips in place. These skin closures may need to stay in place for 2 weeks or longer. If adhesive strip edges start to loosen and curl up, you may trim the loose edges. Do not remove adhesive strips completely unless your health care provider tells you to do that. Check your wound every day for signs of infection. Check  for:  More redness, swelling, or pain.  More fluid or blood.  Warmth.  Pus or a bad smell. If you were sent home with a drain tube in place, follow instructions from your health care provider about:  How to empty it.  How to care for it at home.  General instructions  Rest the affected area.  Do not take baths, swim, or use a hot tub until your health care provider approves. Ask your health care provider if you may take showers. You may only be allowed to take sponge baths.  Return to your normal activities as told by your health care provider. Ask your health care provider what activities are safe for you. Your health care provider may put you on activity or lifting restrictions.  The incision will continue to drain. It is normal to have some clear or slightly bloody drainage. The amount of drainage should lessen each day.  Do not apply any creams, ointments, or liquids unless you have been told to by your health care provider.  Keep all follow-up visits as told by your health care provider. This is important. Contact a health care provider if:  Your cyst or abscess returns.  You have a fever or chills.  You have more redness, swelling, or pain around your incision.  You have more fluid or blood coming from your incision.  Your incision feels warm to the touch.  You have pus or a bad smell coming from your incision.  You have red streaks   above or below the incision site. Get help right away if:  You have severe pain or bleeding.  You cannot eat or drink without vomiting.  You have decreased urine output.  You become short of breath.  You have chest pain.  You cough up blood.  The affected area becomes numb or starts to tingle. These symptoms may represent a serious problem that is an emergency. Do not wait to see if the symptoms will go away. Get medical help right away. Call your local emergency services (911 in the [place] Do not drive yourself to the  hospital. Summary  After this procedure, it is common to have fluid, blood, or pus coming from the surgery site.  Follow all home care instructions. You will be told how to take care of your incision, how to check for infection, and how to take medicines.  If you were prescribed an antibiotic medicine, take it as told by your health care provider. Do not stop taking the antibiotic even if you start to feel better.  Contact a health care provider if you have increased redness, swelling, or pain around your incision. Get help right away if you have chest pain, you vomit, you cough up blood, or you have shortness of breath.  Keep all follow-up visits as told by your health care provider. This is important. This information is not intended to replace advice given to you by your health care provider. Make sure you discuss any questions you have with your health care provider. Document Revised: 01/23/2018 Document Reviewed: 01/23/2018 Elsevier Patient Education  2020 [employer]   

## 2019-05-08 NOTE — Progress Notes (Signed)
   Subjective:    Patient ID: Theresa Beard, female    DOB: Jul 21, 1975, 44 y.o.   MRN: [id number]  HPI  Pt is a 44 yo female who presents to the clinic with a bump on the back of her left ear. She has had it for a while but typically does not bother her. The bump has drained a few times and then gone away again. She noticed the area getting more tender on Monday and now it is much more tender. No drainage. No fever. Not tried anything to make better.   .. Active Ambulatory Problems    Diagnosis Date Noted  . HYPERTENSION, MILD 12/22/2009  . 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 Cervical degenerative disc disease 11/05/2011  . Obesity 11/23/2011  . Acid reflux 01/11/2012  . 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 Anxiety state 11/05/2013  . Microcytic anemia 11/07/2013  . Vitamin D insufficiency 11/07/2013  . Trouble in sleeping 01/27/2015  . Pre-diabetes 04/06/2015  . Morbid obesity due to excess calories (HCC) 07/11/2015  . Pain of left thumb 07/28/2015  . Stress at work 04/06/2017  . Irritable 04/06/2017  . Acquired skin tag 09/01/2017  . Tendinitis of left triceps 09/02/2017  . Plantar fasciitis of left foot 09/02/2017   Resolved Ambulatory Problems    Diagnosis Date Noted  . CHOLECYSTITIS - ACUTE 12/11/2009  . ABDOMINAL PAIN, ACUTE 12/11/2009  . Peroneal tendinitis of left lower extremity 11/23/2011  . Abnormality of gait 12/14/2011  . Influenza-like illness 04/21/2012  . Subacromial bursitis 03/16/2013  . Skin lesion 04/10/2013  . Controlled type 2 diabetes mellitus without complication, without long-term current use of insulin (HCC) 01/27/2015  . Medial epicondylitis, left 09/01/2017   No Additional Past Medical History       Review of Systems See hpi.     Objective:   Physical Exam Vitals reviewed.  Skin:    Comments: Posterior left ear lobe with raised pustule and surrounding erythema. Anterior ear can feel a mass seeming  to tract from posterior ear pustule. Tender to palpation. No active drainage.            Assessment & Plan:  .Marland KitchenJitruttana was seen today for cyst.  Diagnoses and all orders for this visit:  Inflamed sebaceous cyst -     doxycycline (VIBRA-TABS) 100 MG tablet; Take 1 tablet (100 mg total) by mouth 2 (two) times daily.   Will I and D area today.  Does appear to be some cellulitis. Start doxycycline.  No significant pus or cyst pieces came out.  Follow up in 1 week. If still present will send to dermatology for complete cyst removal.  Use warm compresses.  Keep covered and clean.   Incision and Drainage Procedure Note Diagnosis: inflamed epidermoid cyst Location: posterior left ear lobe Informed Consent: Discussed risks (permanent scarring, light or dark discoloration, infection, pain, bleeding, bruising, redness, damage to adjacent structures, and recurrence of the lesion) and benefits of the procedure, as well as the alternatives.  Informed consent was obtained. Preparation: The area was prepared and draped in a standard fashion. Anesthesia: Lidocaine 1% without epinephrine Procedure Details: An incision was made overlying the lesion. The lesion drained blood mostly other than initial pus.   A small amount of fluid was drained.    Antibiotic ointment and a sterile pressure dressing were applied. The patient tolerated procedure well. Plan: The patient was instructed on post-op care. Recommend OTC analgesia as needed for pain.

## 2019-05-14 ENCOUNTER — Encounter: Payer: Self-pay | Admitting: Physician Assistant

## 2019-05-14 DIAGNOSIS — R7301 Impaired fasting glucose: Secondary | ICD-10-CM | POA: Insufficient documentation

## 2019-05-14 NOTE — Telephone Encounter (Signed)
Theresa Beard,   Happy Early Birthday!  Your hemoglobin looks MUCH better. Iron stores are still pretty low. How much iron are you taking.   Vitamin D is still VERY low. We need to put on high dose to get that up. Are you taking any vitamin D regularly.   TSH in normal range but upper limits of normal. Can discuss at CPE.   One liver enzyme is up just a hair. Perhaps due to weight.   Your fasting sugar is up quite a bit. We are adding an A!C to labs to further evaluate for diabetes.   LDL to goal with no risk factors. HDL down from one year ago. Your goals could change if you were to have diabetes. We would want LDL under 70.

## 2019-05-16 ENCOUNTER — Ambulatory Visit (INDEPENDENT_AMBULATORY_CARE_PROVIDER_SITE_OTHER): Payer: No Typology Code available for payment source | Admitting: Physician Assistant

## 2019-05-16 VITALS — BP 151/85 | HR 74 | Ht 64.0 in | Wt 292.0 lb

## 2019-05-16 DIAGNOSIS — E785 Hyperlipidemia, unspecified: Secondary | ICD-10-CM

## 2019-05-16 DIAGNOSIS — Z23 Encounter for immunization: Secondary | ICD-10-CM | POA: Diagnosis not present

## 2019-05-16 DIAGNOSIS — F411 Generalized anxiety disorder: Secondary | ICD-10-CM | POA: Diagnosis not present

## 2019-05-16 DIAGNOSIS — I10 Essential (primary) hypertension: Secondary | ICD-10-CM | POA: Diagnosis not present

## 2019-05-16 DIAGNOSIS — D508 Other iron deficiency anemias: Secondary | ICD-10-CM

## 2019-05-16 DIAGNOSIS — Z0000 Encounter for general adult medical examination without abnormal findings: Secondary | ICD-10-CM

## 2019-05-16 DIAGNOSIS — Z1231 Encounter for screening mammogram for malignant neoplasm of breast: Secondary | ICD-10-CM

## 2019-05-16 DIAGNOSIS — E1169 Type 2 diabetes mellitus with other specified complication: Secondary | ICD-10-CM

## 2019-05-16 LAB — COMPLETE METABOLIC PANEL WITH GFR
AG Ratio: 1.3 (calc) (ref 1.0–2.5)
ALT: 30 U/L — ABNORMAL HIGH (ref 6–29)
AST: 25 U/L (ref 10–30)
Albumin: 4 g/dL (ref 3.6–5.1)
Alkaline phosphatase (APISO): 61 U/L (ref 31–125)
BUN: 14 mg/dL (ref 7–25)
CO2: 24 mmol/L (ref 20–32)
Calcium: 9.7 mg/dL (ref 8.6–10.2)
Chloride: 105 mmol/L (ref 98–110)
Creat: 0.64 mg/dL (ref 0.50–1.10)
GFR, Est African American: 126 mL/min/{1.73_m2} (ref >=60)
GFR, Est Non African American: 109 mL/min/{1.73_m2} (ref >=60)
Globulin: 3.1 g/dL (calc) (ref 1.9–3.7)
Glucose, Bld: 154 mg/dL — ABNORMAL HIGH (ref 65–99)
Potassium: 4 mmol/L (ref 3.5–5.3)
Sodium: 138 mmol/L (ref 135–146)
Total Bilirubin: 0.5 mg/dL (ref 0.2–1.2)
Total Protein: 7.1 g/dL (ref 6.1–8.1)

## 2019-05-16 LAB — LIPID PANEL W/REFLEX DIRECT LDL
Cholesterol: 161 mg/dL (ref <200)
HDL: 44 mg/dL — ABNORMAL LOW (ref >=50)
LDL Cholesterol (Calc): 97 mg/dL (calc)
Non-HDL Cholesterol (Calc): 117 mg/dL (calc) (ref <130)
Total CHOL/HDL Ratio: 3.7 (calc) (ref <5.0)
Triglycerides: 106 mg/dL (ref <150)

## 2019-05-16 LAB — CBC
HCT: 38.1 % (ref 35.0–45.0)
Hemoglobin: 12.1 g/dL (ref 11.7–15.5)
MCH: 24.6 pg — ABNORMAL LOW (ref 27.0–33.0)
MCHC: 31.8 g/dL — ABNORMAL LOW (ref 32.0–36.0)
MCV: 77.4 fL — ABNORMAL LOW (ref 80.0–100.0)
MPV: 10 fL (ref 7.5–12.5)
Platelets: 355 10*3/uL (ref 140–400)
RBC: 4.92 10*6/uL (ref 3.80–5.10)
RDW: 14.2 % (ref 11.0–15.0)
WBC: 9.1 10*3/uL (ref 3.8–10.8)

## 2019-05-16 LAB — TSH: TSH: 4.07 mIU/L

## 2019-05-16 LAB — VITAMIN D 25 HYDROXY (VIT D DEFICIENCY, FRACTURES): Vit D, 25-Hydroxy: 12 ng/mL — ABNORMAL LOW (ref 30–100)

## 2019-05-16 LAB — FERRITIN: Ferritin: 6 ng/mL — ABNORMAL LOW (ref 16–232)

## 2019-05-16 LAB — IRON: Iron: 44 ug/dL (ref 40–190)

## 2019-05-16 LAB — HEMOGLOBIN A1C W/OUT EAG: Hgb A1c MFr Bld: 7.5 % of total Hgb — ABNORMAL HIGH (ref <5.7)

## 2019-05-16 MED ORDER — BUPROPION HCL ER (XL) 150 MG PO TB24: 150.0000 mg | ORAL_TABLET | Freq: Every day | ORAL | 1 refills | Status: DC

## 2019-05-16 MED ORDER — BUSPIRONE HCL 5 MG PO TABS: 5.0000 mg | ORAL_TABLET | Freq: Three times a day (TID) | ORAL | 1 refills | Status: DC

## 2019-05-16 MED ORDER — METFORMIN HCL 1000 MG PO TABS: 1000.0000 mg | ORAL_TABLET | Freq: Two times a day (BID) | ORAL | 0 refills | Status: DC

## 2019-05-16 MED ORDER — ATORVASTATIN CALCIUM 10 MG PO TABS: 10.0000 mg | ORAL_TABLET | Freq: Every day | ORAL | 3 refills | Status: DC

## 2019-05-16 MED ORDER — FUSION 65-65-25-30 MG PO CAPS: 1.0000 | ORAL_CAPSULE | Freq: Every day | ORAL | 11 refills | Status: DC

## 2019-05-16 MED ORDER — TRULICITY 0.75 MG/0.5ML ~~LOC~~ SOAJ: 0.7500 mg | SUBCUTANEOUS | 0 refills | Status: DC

## 2019-05-16 MED ORDER — ESCITALOPRAM OXALATE 20 MG PO TABS: 20.0000 mg | ORAL_TABLET | Freq: Every day | ORAL | 1 refills | Status: DC

## 2019-05-16 MED ORDER — LOSARTAN POTASSIUM 50 MG PO TABS: 50.0000 mg | ORAL_TABLET | Freq: Every day | ORAL | 1 refills | Status: DC

## 2019-05-16 MED FILL — TRULICITY 0.75 MG/0.5 ML PE: 0.75 | 28 days supply | Qty: 2 | Fill #0

## 2019-05-16 MED FILL — ESCITALOPRAM 20 MG TABLET: 20 | 90 days supply | Qty: 90 | Fill #0

## 2019-05-16 MED FILL — busPIRone HCL 5 MG TABS: 5 | 30 days supply | Qty: 90 | Fill #0

## 2019-05-16 MED FILL — METFORMIN HCL 1000 MG TABS: 1000 | 90 days supply | Qty: 180 | Fill #0

## 2019-05-16 MED FILL — buPROPion HCL ER (XL) 150 M: 150 | 90 days supply | Qty: 90 | Fill #0

## 2019-05-16 MED FILL — ATORVASTATIN 10 MG TABLET: 10 | 90 days supply | Qty: 90 | Fill #0

## 2019-05-16 NOTE — Telephone Encounter (Signed)
I will see you today. A1C is positive for diabetes. We can talk about it.

## 2019-05-18 ENCOUNTER — Other Ambulatory Visit: Payer: Self-pay | Admitting: Physician Assistant

## 2019-05-18 DIAGNOSIS — E559 Vitamin D deficiency, unspecified: Secondary | ICD-10-CM

## 2019-05-18 MED FILL — VIT D2 1.25 MG (50,000 UNIT: 1.25 MG | 84 days supply | Qty: 12 | Fill #0

## 2019-05-21 ENCOUNTER — Encounter: Payer: Self-pay | Admitting: Physician Assistant

## 2019-05-21 NOTE — Progress Notes (Signed)
Subjective:     Theresa Beard is a 44 y.o. female and is here for a comprehensive physical exam. The patient reports problems - she does want to lose weight and get healthier. .  She is having a lot more stress and anxiety with work. She is on lexapro and helped a lot at first but now her anxiety is getting worse.    Social History   Socioeconomic History  . Marital status: Single    Spouse name: Not on file  . Number of children: Not on file  . Years of education: Not on file  . Highest education level: Not on file  Occupational History  . Not on file  Tobacco Use  . Smoking status: Never Smoker  . Smokeless tobacco: Never Used  Substance and Sexual Activity  . Alcohol use: Yes  . Drug use: No  . Sexual activity: Not on file    Comment: no regular exercise, 2 caffeine drinks daily.  Other Topics Concern  . Not on file  Social History Narrative  . Not on file   Social Determinants of Health   Financial Resource Strain:   . Difficulty of Paying Living Expenses:   Food Insecurity:   . Worried About Programme researcher, broadcasting/film/video in the Last Year:   . Barista in the Last Year:   Transportation Needs:   . Freight forwarder (Medical):   Marland Kitchen Lack of Transportation (Non-Medical):   Physical Activity:   . Days of Exercise per Week:   . Minutes of Exercise per Session:   Stress:   . Feeling of Stress :   Social Connections:   . Frequency of Communication with Friends and Family:   . Frequency of Social Gatherings with Friends and Family:   . Attends Religious Services:   . Active Member of Clubs or Organizations:   . Attends Banker Meetings:   Marland Kitchen Marital Status:   Intimate Partner Violence:   . Fear of Current or Ex-Partner:   . Emotionally Abused:   Marland Kitchen Physically Abused:   . Sexually Abused:    Health Maintenance  Topic Date Due  . FOOT EXAM  Never done  . OPHTHALMOLOGY EXAM  Never done  . MAMMOGRAM  04/15/2016  . PAP SMEAR-Modifier   05/21/2019 (Originally 03/08/2014)  . HIV Screening  05/15/2020 (Originally 05/14/1989)  . HEMOGLOBIN A1C  11/11/2019  . TETANUS/TDAP  03/08/2026  . INFLUENZA VACCINE  Completed  . PNEUMOCOCCAL POLYSACCHARIDE VACCINE AGE 59-64 HIGH RISK  Completed    The following portions of the patient's history were reviewed and updated as appropriate: allergies, current medications, past family history, past medical history, past social history, past surgical history and problem list.  Review of Systems A comprehensive review of systems was negative.   Objective:    BP (!) 151/85   Pulse 74   Ht 5\' 4"  (1.626 m)   Wt 292 lb (132.5 kg)   SpO2 99%   BMI 50.12 kg/m  General appearance: alert, cooperative, appears stated age and morbidly obese Head: Normocephalic, without obvious abnormality, atraumatic Eyes: conjunctivae/corneas clear. PERRL, EOM's intact. Fundi benign. Ears: normal TM's and external ear canals both ears Nose: Nares normal. Septum midline. Mucosa normal. No drainage or sinus tenderness. Throat: lips, mucosa, and tongue normal; teeth and gums normal Neck: no adenopathy, no carotid bruit, no JVD, supple, symmetrical, trachea midline and thyroid not enlarged, symmetric, no tenderness/mass/nodules Back: symmetric, no curvature. ROM normal. No CVA tenderness. Lungs:  clear to auscultation bilaterally Heart: regular rate and rhythm, S1, S2 normal, no murmur, click, rub or gallop Abdomen: soft, non-tender; bowel sounds normal; no masses,  no organomegaly Extremities: extremities normal, atraumatic, no cyanosis or edema Pulses: 2+ and symmetric Skin: Skin color, texture, turgor normal. No rashes or lesions Lymph nodes: Cervical, supraclavicular, and axillary nodes normal. Neurologic: Alert and oriented X 3, normal strength and tone. Normal symmetric reflexes. Normal coordination and gait    Assessment:    Healthy female exam.      Plan:    .Marland KitchenJitruttana was seen today for  hypertension.  Diagnoses and all orders for this visit:  Routine physical examination  Morbid obesity due to excess calories (HCC) -     buPROPion (WELLBUTRIN XL) 150 MG 24 hr tablet; Take 1 tablet (150 mg total) by mouth daily.  Anxiety state -     busPIRone (BUSPAR) 5 MG tablet; Take 1 tablet (5 mg total) by mouth 3 (three) times daily. -     buPROPion (WELLBUTRIN XL) 150 MG 24 hr tablet; Take 1 tablet (150 mg total) by mouth daily. -     escitalopram (LEXAPRO) 20 MG tablet; Take 1 tablet (20 mg total) by mouth daily.  HYPERTENSION, MILD -     losartan (COZAAR) 50 MG tablet; Take 1 tablet (50 mg total) by mouth daily.  Need for pneumococcal vaccination -     Pneumococcal polysaccharide vaccine 23-valent greater than or equal to 2yo subcutaneous/IM  Type 2 diabetes mellitus with other specified complication, without long-term current use of insulin (HCC) -     metFORMIN (GLUCOPHAGE) 1000 MG tablet; Take 1 tablet (1,000 mg total) by mouth 2 (two) times daily with a meal. -     Dulaglutide (TRULICITY) 3.41 DQ/2.2WL SOPN; Inject 0.75 mg into the skin once a week.  Visit for screening mammogram -     MM 3D SCREEN BREAST BILATERAL  Hyperlipidemia LDL goal <70 -     atorvastatin (LIPITOR) 10 MG tablet; Take 1 tablet (10 mg total) by mouth daily.  Iron deficiency anemia secondary to inadequate dietary iron intake -     Fe Fum-Fe Poly-Vit C-Lactobac (FUSION) 65-65-25-30 MG CAPS; Take 1 tablet by mouth daily.   .. Discussed 150 minutes of exercise a week.  Encouraged vitamin D 1000 units and Calcium 1300mg  or 4 servings of dairy a day.  Fasting labs ordered and reviewed today.  Pap declined.   Not able to tolerate oral iron. Will try fusion iron.   New dx of DM.  .. Lab Results  Component Value Date   HGBA1C 7.5 (H) 05/11/2019   Discussed DM diet and exercise.  Started metformin and trulicity. Discussed how to take and use pen.  Discussed side effects.  Needs eye exam.   LDL not to goal started lipitor.  BP not to goal started cozaar.  Pneumonia shot given.   Follow up in 3 months.   Added buspar for anxiety.     See After Visit Summary for Counseling Recommendations

## 2019-05-21 NOTE — Patient Instructions (Signed)
Health Maintenance, Female Adopting a healthy lifestyle and getting preventive care are important in promoting health and wellness. Ask your health care provider about:  The right schedule for you to have regular tests and exams.  Things you can do on your own to prevent diseases and keep yourself healthy. What should I know about diet, weight, and exercise? Eat a healthy diet   Eat a diet that includes plenty of vegetables, fruits, low-fat dairy products, and lean protein.  Do not eat a lot of foods that are high in solid fats, added sugars, or sodium. Maintain a healthy weight Body mass index (BMI) is used to identify weight problems. It estimates body fat based on height and weight. Your health care provider can help determine your BMI and help you achieve or maintain a healthy weight. Get regular exercise Get regular exercise. This is one of the most important things you can do for your health. Most adults should:  Exercise for at least 150 minutes each week. The exercise should increase your heart rate and make you sweat (moderate-intensity exercise).  Do strengthening exercises at least twice a week. This is in addition to the moderate-intensity exercise.  Spend less time sitting. Even light physical activity can be beneficial. Watch cholesterol and blood lipids Have your blood tested for lipids and cholesterol at 45 years of age, then have this test every 5 years. Have your cholesterol levels checked more often if:  Your lipid or cholesterol levels are high.  You are older than 45 years of age.  You are at high risk for heart disease. What should I know about cancer screening? Depending on your health history and family history, you may need to have cancer screening at various ages. This may include screening for:  Breast cancer.  Cervical cancer.  Colorectal cancer.  Skin cancer.  Lung cancer. What should I know about heart disease, diabetes, and high blood  pressure? Blood pressure and heart disease  High blood pressure causes heart disease and increases the risk of stroke. This is more likely to develop in people who have high blood pressure readings, are of African descent, or are overweight.  Have your blood pressure checked: ? Every 3-5 years if you are 18-39 years of age. ? Every year if you are 40 years old or older. Diabetes Have regular diabetes screenings. This checks your fasting blood sugar level. Have the screening done:  Once every three years after age 40 if you are at a normal weight and have a low risk for diabetes.  More often and at a younger age if you are overweight or have a high risk for diabetes. What should I know about preventing infection? Hepatitis B If you have a higher risk for hepatitis B, you should be screened for this virus. Talk with your health care provider to find out if you are at risk for hepatitis B infection. Hepatitis C Testing is recommended for:  Everyone born from 1945 through 1965.  Anyone with known risk factors for hepatitis C. Sexually transmitted infections (STIs)  Get screened for STIs, including gonorrhea and chlamydia, if: ? You are sexually active and are younger than 45 years of age. ? You are older than 45 years of age and your health care provider tells you that you are at risk for this type of infection. ? Your sexual activity has changed since you were last screened, and you are at increased risk for chlamydia or gonorrhea. Ask your health care provider if   you are at risk.  Ask your health care provider about whether you are at high risk for HIV. Your health care provider may recommend a prescription medicine to help prevent HIV infection. If you choose to take medicine to prevent HIV, you should first get tested for HIV. You should then be tested every 3 months for as long as you are taking the medicine. Pregnancy  If you are about to stop having your period (premenopausal) and  you may become pregnant, seek counseling before you get pregnant.  Take 400 to 800 micrograms (mcg) of folic acid every day if you become pregnant.  Ask for birth control (contraception) if you want to prevent pregnancy. Osteoporosis and menopause Osteoporosis is a disease in which the bones lose minerals and strength with aging. This can result in bone fractures. If you are 65 years old or older, or if you are at risk for osteoporosis and fractures, ask your health care provider if you should:  Be screened for bone loss.  Take a calcium or vitamin D supplement to lower your risk of fractures.  Be given hormone replacement therapy (HRT) to treat symptoms of menopause. Follow these instructions at home: Lifestyle  Do not use any products that contain nicotine or tobacco, such as cigarettes, e-cigarettes, and chewing tobacco. If you need help quitting, ask your health care provider.  Do not use street drugs.  Do not share needles.  Ask your health care provider for help if you need support or information about quitting drugs. Alcohol use  Do not drink alcohol if: ? Your health care provider tells you not to drink. ? You are pregnant, may be pregnant, or are planning to become pregnant.  If you drink alcohol: ? Limit how much you use to 0-1 drink a day. ? Limit intake if you are breastfeeding.  Be aware of how much alcohol is in your drink. In the [place] one drink equals one 12 oz bottle of beer (355 mL), one 5 oz glass of wine (148 mL), or one 1 oz glass of hard liquor (44 mL). General instructions  Schedule regular health, dental, and eye exams.  Stay current with your vaccines.  Tell your health care provider if: ? You often feel depressed. ? You have ever been abused or do not feel safe at home. Summary  Adopting a healthy lifestyle and getting preventive care are important in promoting health and wellness.  Follow your health care provider's instructions about healthy  diet, exercising, and getting tested or screened for diseases.  Follow your health care provider's instructions on monitoring your cholesterol and blood pressure. This information is not intended to replace advice given to you by your health care provider. Make sure you discuss any questions you have with your health care provider. Document Revised: 02/15/2018 Document Reviewed: 02/15/2018 Elsevier Patient Education  2020 Elsevier Inc.  

## 2019-06-06 ENCOUNTER — Encounter: Payer: Self-pay | Admitting: Physician Assistant

## 2019-06-12 MED FILL — TRULICITY 0.75 MG/0.5 ML PE: 0.75 | 28 days supply | Qty: 2 | Fill #1

## 2019-08-08 ENCOUNTER — Ambulatory Visit (INDEPENDENT_AMBULATORY_CARE_PROVIDER_SITE_OTHER): Payer: No Typology Code available for payment source | Admitting: Physician Assistant

## 2019-08-08 ENCOUNTER — Other Ambulatory Visit: Payer: Self-pay

## 2019-08-08 ENCOUNTER — Encounter: Payer: Self-pay | Admitting: Physician Assistant

## 2019-08-08 VITALS — BP 135/82 | HR 80 | Ht 64.0 in | Wt 279.0 lb

## 2019-08-08 DIAGNOSIS — E1169 Type 2 diabetes mellitus with other specified complication: Secondary | ICD-10-CM

## 2019-08-08 LAB — POCT GLYCOSYLATED HEMOGLOBIN (HGB A1C): Hemoglobin A1C: 6 % — AB (ref 4.0–5.6)

## 2019-08-08 MED ORDER — TRULICITY 1.5 MG/0.5ML ~~LOC~~ SOAJ: 1.5000 mg | SUBCUTANEOUS | 0 refills | Status: DC

## 2019-08-08 MED FILL — TRULICITY 1.5 MG/0.5 ML PEN: 1.5 | 84 days supply | Qty: 6 | Fill #0

## 2019-08-08 NOTE — Progress Notes (Signed)
Subjective:    Patient ID: Theresa Beard, female    DOB: May 12, 1975, 44 y.o.   MRN: [id number]  HPI  Patient is a 44 year old obese female with type 2 diabetes and hypertension who presents to the clinic for 45-month follow-up on diabetes.  Patient has made numerous changes this month. She has limited her sugars and carbs and become much more active. She is walking almost every other day. She was not able to tolerate Metformin due to dizziness. She has been using the Trulicity. She has lost 13 pounds in 3 months. She denies any hypoglycemic events. She is very motivated to change her diabetes status. She denies any chest pain, headache, palpitations.  .. Active Ambulatory Problems    Diagnosis Date Noted  . HYPERTENSION, MILD 12/22/2009  . 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 Cervical degenerative disc disease 11/05/2011  . Obesity 11/23/2011  . Acid reflux 01/11/2012  . 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 Anxiety state 11/05/2013  . Microcytic anemia 11/07/2013  . Vitamin D insufficiency 11/07/2013  . Trouble in sleeping 01/27/2015  . Diabetes mellitus (HCC) 01/27/2015  . Pre-diabetes 04/06/2015  . Morbid obesity due to excess calories (HCC) 07/11/2015  . Pain of left thumb 07/28/2015  . Stress at work 04/06/2017  . Irritable 04/06/2017  . Acquired skin tag 09/01/2017  . Tendinitis of left triceps 09/02/2017  . Plantar fasciitis of left foot 09/02/2017  . Elevated fasting glucose 05/14/2019   Resolved Ambulatory Problems    Diagnosis Date Noted  . CHOLECYSTITIS - ACUTE 12/11/2009  . ABDOMINAL PAIN, ACUTE 12/11/2009  . Peroneal tendinitis of left lower extremity 11/23/2011  . Abnormality of gait 12/14/2011  . Influenza-like illness 04/21/2012  . Subacromial bursitis 03/16/2013  . Skin lesion 04/10/2013  . Medial epicondylitis, left 09/01/2017   No Additional Past Medical History      Review of Systems  All other systems  reviewed and are negative.      Objective:   Physical Exam Vitals reviewed.  Constitutional:      Appearance: Normal appearance. She is obese.  Cardiovascular:     Rate and Rhythm: Normal rate and regular rhythm.     Pulses: Normal pulses.  Pulmonary:     Effort: Pulmonary effort is normal.     Breath sounds: Normal breath sounds.  Neurological:     General: No focal deficit present.     Mental Status: She is alert and oriented to person, place, and time.  Psychiatric:        Mood and Affect: Mood normal.     .. Depression screen [facility] 2/9 08/08/2019 07/07/2018 08/31/2017 06/17/2017 04/06/2017  Decreased Interest 0 0 0 1 3  Down, Depressed, Hopeless 0 0 0 0 1  PHQ - 2 Score 0 0 0 1 4  Altered sleeping 1 - 0 0 2  Tired, decreased energy 0 - 1 1 3   Change in appetite 0 - 1 2 3   Feeling bad or failure about yourself  0 - 0 0 0  Trouble concentrating 0 - 0 0 0  Moving slowly or fidgety/restless 0 - 0 0 0  Suicidal thoughts 0 - 0 0 0  PHQ-9 Score 1 - 2 4 12   Difficult doing work/chores Not difficult at all - Not difficult at all Somewhat difficult Very difficult   . GAD 7 : Generalized Anxiety Score 08/08/2019 07/07/2018 08/31/2017 06/17/2017  Nervous, Anxious, on Edge 1 2 1 1   Control/stop worrying 0 0  0 1  Worry too much - different things 0 0 0 1  Trouble relaxing 1 1 0 0  Restless 0 0 0 0  Easily annoyed or irritable 1 2 1  0  Afraid - awful might happen 0 0 0 0  Total GAD 7 Score 3 5 2 3   Anxiety Difficulty Not difficult at all Not difficult at all Somewhat difficult Not difficult at all          Assessment & Plan:  .Marland KitchenJitruttana was seen today for diabetes.  Diagnoses and all orders for this visit:  Type 2 diabetes mellitus with other specified complication, without long-term current use of insulin (HCC) -     POCT glycosylated hemoglobin (Hb A1C) -     Dulaglutide (TRULICITY) 1.5 EX/5.1ZG SOPN; Inject 1.5 mg into the skin once a week.  Morbid obesity due to excess  calories ([place])   .Marland Kitchen Results for orders placed or performed in visit on 08/08/19  POCT glycosylated hemoglobin (Hb A1C)  Result Value Ref Range   Hemoglobin A1C 6.0 (A) 4.0 - 5.6 %   HbA1c POC (<> result, manual entry)     HbA1c, POC (prediabetic range)     HbA1c, POC (controlled diabetic range)     A1c is stable. She has made significant changes and decreased from 7.5 at last visit. Stay on Trulicity. Increase to 1.5. Hopefully this will help with weight control. On ACE. On ARB. Blood pressure to goal. Foot exam up-to-date. Need eye exam. Patient is aware to schedule. Pneumonia vaccine up-to-date.  Continue discussed weight. Goal is 15 pounds in the next 3 months.  Needs pap smear.

## 2019-08-27 ENCOUNTER — Ambulatory Visit: Payer: Self-pay

## 2019-08-30 MED FILL — LOSARTAN POTASSIUM 50 MG TA: 50 | 90 days supply | Qty: 90 | Fill #0

## 2019-09-04 ENCOUNTER — Encounter: Payer: No Typology Code available for payment source | Admitting: Physician Assistant

## 2019-10-26 ENCOUNTER — Other Ambulatory Visit: Payer: Self-pay | Admitting: Physician Assistant

## 2019-10-26 DIAGNOSIS — E1169 Type 2 diabetes mellitus with other specified complication: Secondary | ICD-10-CM

## 2019-10-26 DIAGNOSIS — K219 Gastro-esophageal reflux disease without esophagitis: Secondary | ICD-10-CM

## 2019-10-26 MED FILL — TRULICITY 1.5 MG/0.5 ML PEN: 1.5 | 84 days supply | Qty: 6 | Fill #0

## 2019-10-26 MED FILL — ESOMEPRAZOLE MAG DR 40 MG C: 40 | 90 days supply | Qty: 90 | Fill #0

## 2019-11-20 ENCOUNTER — Ambulatory Visit (INDEPENDENT_AMBULATORY_CARE_PROVIDER_SITE_OTHER): Payer: No Typology Code available for payment source | Admitting: Physician Assistant

## 2019-11-20 ENCOUNTER — Encounter: Payer: Self-pay | Admitting: Physician Assistant

## 2019-11-20 VITALS — BP 134/77 | HR 76 | Ht 64.0 in | Wt 270.0 lb

## 2019-11-20 DIAGNOSIS — Z6841 Body Mass Index (BMI) 40.0 and over, adult: Secondary | ICD-10-CM

## 2019-11-20 DIAGNOSIS — E1169 Type 2 diabetes mellitus with other specified complication: Secondary | ICD-10-CM | POA: Diagnosis not present

## 2019-11-20 DIAGNOSIS — T50905A Adverse effect of unspecified drugs, medicaments and biological substances, initial encounter: Secondary | ICD-10-CM

## 2019-11-20 DIAGNOSIS — I10 Essential (primary) hypertension: Secondary | ICD-10-CM | POA: Diagnosis not present

## 2019-11-20 LAB — POCT GLYCOSYLATED HEMOGLOBIN (HGB A1C): Hemoglobin A1C: 6.2 % — AB (ref 4.0–5.6)

## 2019-11-20 MED ORDER — FLUCONAZOLE 150 MG PO TABS: 150.0000 mg | ORAL_TABLET | Freq: Once | ORAL | 0 refills | Status: AC

## 2019-11-20 MED ORDER — XIGDUO XR 5-500 MG PO TB24: 1.0000 | ORAL_TABLET | Freq: Every day | ORAL | 2 refills | Status: DC

## 2019-11-20 MED FILL — FLUCONAZOLE 150 MG TABS: 150 | 2 days supply | Qty: 2 | Fill #0

## 2019-11-20 MED FILL — XIGDUO XR 5 MG-500 MG TAB: 5-500 | 30 days supply | Qty: 30 | Fill #0

## 2019-11-20 NOTE — Patient Instructions (Addendum)
xigduo start for diabetes after you know you tolerate buspar.  Continue on cozaar Restart buspar for anxiety.

## 2019-11-20 NOTE — Progress Notes (Signed)
Subjective:    Patient ID: Theresa Beard, female    DOB: 03-25-1975, 44 y.o.   MRN: [id number]  HPI  Patient is a 44 year old obese female with hypertension and type 2 diabetes who presents to the clinic for her 45-month follow-up.  Patient was being very compliant with her Trulicity but about 2 weeks ago she started getting major site reactions of abdomen swelling and warmth around injection site.  She also noticed that she had just been feeling more congested and runny nose.  She stopped the Trulicity and within a week the symptoms have resolved.  She does not want to try another GLP-1.  She is not checking her sugars regularly.  She was doing very well with diet and exercise but since the last Covid pandemic flare she has not been as compliant.  She still managed to lose 7 pounds over the past 3 months.  No hypoglycemia.  No open sores or wounds.  Patient also stopped all her mood medications.  She is not sure why she did this.  She thought she could handle it because she was doing great.  She would like to restart these medications.  She is having a lot of work related stress.  She denies any suicidal thoughts or homicidal idealizations.  They were working very well for her.  Patient denies any chest pain, shortness of breath, headache, vision changes.  She is taking her Cozaar daily.  Patient to get her Covid vaccine at [facility] and completed the series in August.  .. Active Ambulatory Problems    Diagnosis Date Noted  . HYPERTENSION, MILD 12/22/2009  . 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 Cervical degenerative disc disease 11/05/2011  . Acid reflux 01/11/2012  . 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 Anxiety state 11/05/2013  . Microcytic anemia 11/07/2013  . Vitamin D insufficiency 11/07/2013  . Trouble in sleeping 01/27/2015  . Diabetes mellitus (HCC) 01/27/2015  . Class 3 severe obesity due to excess calories with serious comorbidity and  body mass index (BMI) of 45.0 to 49.9 in adult (HCC) 07/11/2015  . Stress at work 04/06/2017  . Irritable 04/06/2017  . Acquired skin tag 09/01/2017  . Tendinitis of left triceps 09/02/2017  . Plantar fasciitis of left foot 09/02/2017  . Elevated fasting glucose 05/14/2019  . Medication side effect, initial encounter 11/21/2019   Resolved Ambulatory Problems    Diagnosis Date Noted  . CHOLECYSTITIS - ACUTE 12/11/2009  . ABDOMINAL PAIN, ACUTE 12/11/2009  . Peroneal tendinitis of left lower extremity 11/23/2011  . Obesity 11/23/2011  . Abnormality of gait 12/14/2011  . Influenza-like illness 04/21/2012  . Subacromial bursitis 03/16/2013  . Skin lesion 04/10/2013  . Pre-diabetes 04/06/2015  . Pain of left thumb 07/28/2015  . Medial epicondylitis, left 09/01/2017   No Additional Past Medical History     Review of Systems  All other systems reviewed and are negative.      Objective:   Physical Exam Vitals reviewed.  Constitutional:      Appearance: Normal appearance. She is obese.  Cardiovascular:     Rate and Rhythm: Normal rate and regular rhythm.     Pulses: Normal pulses.     Heart sounds: Normal heart sounds.  Pulmonary:     Effort: Pulmonary effort is normal.     Breath sounds: Normal breath sounds.  Musculoskeletal:     Right lower leg: No edema.     Left lower leg: No edema.  Neurological:  General: No focal deficit present.     Mental Status: She is alert and oriented to person, place, and time.  Psychiatric:        Mood and Affect: Mood normal.       .. Depression screen [facility] 2/9 08/08/2019 07/07/2018 08/31/2017 06/17/2017 04/06/2017  Decreased Interest 0 0 0 1 3  Down, Depressed, Hopeless 0 0 0 0 1  PHQ - 2 Score 0 0 0 1 4  Altered sleeping 1 - 0 0 2  Tired, decreased energy 0 - 1 1 3   Change in appetite 0 - 1 2 3   Feeling bad or failure about yourself  0 - 0 0 0  Trouble concentrating 0 - 0 0 0  Moving slowly or fidgety/restless 0 - 0 0 0   Suicidal thoughts 0 - 0 0 0  PHQ-9 Score 1 - 2 4 12   Difficult doing work/chores Not difficult at all - Not difficult at all Somewhat difficult Very difficult   . GAD 7 : Generalized Anxiety Score 08/08/2019 07/07/2018 08/31/2017 06/17/2017  Nervous, Anxious, on Edge 1 2 1 1   Control/stop worrying 0 0 0 1  Worry too much - different things 0 0 0 1  Trouble relaxing 1 1 0 0  Restless 0 0 0 0  Easily annoyed or irritable 1 2 1  0  Afraid - awful might happen 0 0 0 0  Total GAD 7 Score 3 5 2 3   Anxiety Difficulty Not difficult at all Not difficult at all Somewhat difficult Not difficult at all        Assessment & Plan:  .7/1/2020[name] was seen today for allergic reaction.  Diagnoses and all orders for this visit:  Type 2 diabetes mellitus with other specified complication, without long-term current use of insulin (HCC) -     POCT glycosylated hemoglobin (Hb A1C) -     Dapagliflozin-metFORMIN HCl ER (XIGDUO XR) 5-500 MG TB24; Take 1 tablet by mouth daily.  HYPERTENSION, MILD -     losartan (COZAAR) 50 MG tablet; Take 1 tablet (50 mg total) by mouth daily.  Medication side effect, initial encounter  Class 3 severe obesity due to excess calories with serious comorbidity and body mass index (BMI) of 45.0 to 49.9 in adult [facility])  Other orders -     fluconazole (DIFLUCAN) 150 MG tablet; Take 1 tablet (150 mg total) by mouth once for 1 dose.    Lab Results  Component Value Date   HGBA1C 6.2 (A) 11/20/2019   A1c is stable and controlled. I fear that going off her GLP-1 would cause these numbers to rise again. We will add xigduo daily.  Discussed side effects and she was given some Diflucan if she were to develop yeast infection.  Follow-up as needed BP very close to goal.  On ARB.  Refill Cozaar today Foot exam up-to-date Eye exam needed. Discussed with patient.  Covid vaccine and pneumonia vaccine completed Patient will get flu shot at work   Discussed mood with patient.   Certainly does not advise that she suddenly go off her medications.  When she feels good like that is likely because medications are working.  If she does want to go off medication suggest a slow taper 1 at a time.  Anxiety is her biggest trigger right now.  We will add back in BuSpar first and then consider the other medications.  I would take BuSpar 3 times a day.  Follow up in 4 weeks.

## 2019-11-21 ENCOUNTER — Encounter: Payer: Self-pay | Admitting: Physician Assistant

## 2019-11-21 DIAGNOSIS — T50905A Adverse effect of unspecified drugs, medicaments and biological substances, initial encounter: Secondary | ICD-10-CM | POA: Insufficient documentation

## 2019-11-21 MED ORDER — LOSARTAN POTASSIUM 50 MG PO TABS
50.0000 mg | ORAL_TABLET | Freq: Every day | ORAL | 1 refills | Status: DC
  Filled 2020-06-24: qty 90, 90d supply, fill #0

## 2019-11-21 MED FILL — LOSARTAN POTASSIUM 50 MG TA: 50 | 90 days supply | Qty: 90 | Fill #0

## 2019-11-29 MED FILL — XIGDUO XR 5 MG-500 MG TAB: 5-500 | 30 days supply | Qty: 30 | Fill #0

## 2019-11-29 MED FILL — LOSARTAN POTASSIUM 50 MG TA: 50 | 90 days supply | Qty: 90 | Fill #0

## 2019-11-29 MED FILL — FLUCONAZOLE 150 MG TABS: 150 | 2 days supply | Qty: 2 | Fill #0

## 2020-05-02 ENCOUNTER — Other Ambulatory Visit: Payer: Self-pay | Admitting: Physician Assistant

## 2020-05-02 ENCOUNTER — Other Ambulatory Visit: Payer: Self-pay

## 2020-05-02 ENCOUNTER — Encounter: Payer: Self-pay | Admitting: Physician Assistant

## 2020-05-02 ENCOUNTER — Ambulatory Visit (INDEPENDENT_AMBULATORY_CARE_PROVIDER_SITE_OTHER): Payer: No Typology Code available for payment source | Admitting: Physician Assistant

## 2020-05-02 VITALS — BP 128/87 | HR 72 | Ht 64.0 in | Wt 270.0 lb

## 2020-05-02 DIAGNOSIS — Z1159 Encounter for screening for other viral diseases: Secondary | ICD-10-CM

## 2020-05-02 DIAGNOSIS — E785 Hyperlipidemia, unspecified: Secondary | ICD-10-CM | POA: Diagnosis not present

## 2020-05-02 DIAGNOSIS — D508 Other iron deficiency anemias: Secondary | ICD-10-CM

## 2020-05-02 DIAGNOSIS — I10 Essential (primary) hypertension: Secondary | ICD-10-CM

## 2020-05-02 DIAGNOSIS — E1169 Type 2 diabetes mellitus with other specified complication: Secondary | ICD-10-CM | POA: Diagnosis not present

## 2020-05-02 DIAGNOSIS — Z6841 Body Mass Index (BMI) 40.0 and over, adult: Secondary | ICD-10-CM

## 2020-05-02 DIAGNOSIS — K219 Gastro-esophageal reflux disease without esophagitis: Secondary | ICD-10-CM

## 2020-05-02 DIAGNOSIS — Z1211 Encounter for screening for malignant neoplasm of colon: Secondary | ICD-10-CM

## 2020-05-02 DIAGNOSIS — Z1329 Encounter for screening for other suspected endocrine disorder: Secondary | ICD-10-CM

## 2020-05-02 LAB — POCT GLYCOSYLATED HEMOGLOBIN (HGB A1C): Hemoglobin A1C: 6.2 % — AB (ref 4.0–5.6)

## 2020-05-02 MED ORDER — ESOMEPRAZOLE MAGNESIUM 40 MG PO CPDR
40.0000 mg | DELAYED_RELEASE_CAPSULE | Freq: Every day | ORAL | 1 refills | Status: DC
Start: 2020-05-02 — End: 2020-05-02

## 2020-05-02 MED ORDER — TRULICITY 1.5 MG/0.5ML ~~LOC~~ SOAJ: 1.5000 mg | SUBCUTANEOUS | 0 refills | Status: DC

## 2020-05-02 MED ORDER — TRULICITY 0.75 MG/0.5ML ~~LOC~~ SOAJ: 0.7500 mg | SUBCUTANEOUS | 0 refills | Status: DC

## 2020-05-02 MED FILL — ESOMEPRAZOLE MAG DR 40 MG C: 40 | 90 days supply | Qty: 90 | Fill #0

## 2020-05-02 MED FILL — TRULICITY 1.5 MG/0.5 ML PEN: 1.5 | 84 days supply | Qty: 6 | Fill #0

## 2020-05-02 NOTE — Progress Notes (Signed)
Subjective:    Patient ID: Theresa Beard, female    DOB: 05-16-1975, 45 y.o.   MRN: [id number]  HPI  Pt is a 45 yo obese female with HTN, T2DM, HLD, IDA who presents to the clinic for medication refills.   Pt is not checking her sugars. She denies any hypoglycemic events. No open sores or wounds. She had xigduo and restarted trulicity. She does not like the way pills make her feel. Not taking xigduo.She has not had any reactions as previously reported with trulicity. She has lost 9lbs. She would like to lose more.   Her mood is good. No major concerns. Work continues to be stressful. 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   She has noticed some dizziness with position changes for the last week or so. Her ears do pop some. No URI or sinusitis symptoms. Not tried anything to make better. No CP, palpitations, HA or SOB. Taking losaartan. No other new medications.   .. Active Ambulatory Problems    Diagnosis Date Noted  . HYPERTENSION, MILD 12/22/2009  . 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 Cervical degenerative disc disease 11/05/2011  . Acid reflux 01/11/2012  . 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 Anxiety state 11/05/2013  . Microcytic anemia 11/07/2013  . Vitamin D insufficiency 11/07/2013  . Trouble in sleeping 01/27/2015  . Diabetes mellitus (HCC) 01/27/2015  . Class 3 severe obesity due to excess calories with serious comorbidity and body mass index (BMI) of 45.0 to 49.9 in adult (HCC) 07/11/2015  . Stress at work 04/06/2017  . Irritable 04/06/2017  . Acquired skin tag 09/01/2017  . Tendinitis of left triceps 09/02/2017  . Plantar fasciitis of left foot 09/02/2017  . Elevated fasting glucose 05/14/2019  . Medication side effect, initial encounter 11/21/2019  . Hyperlipidemia LDL goal <70 05/02/2020  . Iron deficiency anemia secondary to inadequate dietary iron intake 05/02/2020   Resolved Ambulatory Problems     Diagnosis Date Noted  . CHOLECYSTITIS - ACUTE 12/11/2009  . ABDOMINAL PAIN, ACUTE 12/11/2009  . Peroneal tendinitis of left lower extremity 11/23/2011  . Obesity 11/23/2011  . Abnormality of gait 12/14/2011  . Influenza-like illness 04/21/2012  . Subacromial bursitis 03/16/2013  . Skin lesion 04/10/2013  . Pre-diabetes 04/06/2015  . Pain of left thumb 07/28/2015  . Medial epicondylitis, left 09/01/2017   No Additional Past Medical History     Review of Systems  All other systems reviewed and are negative.      Objective:   Physical Exam Vitals reviewed.  Constitutional:      Appearance: Normal appearance. She is obese.  Neck:     Vascular: No carotid bruit.  Cardiovascular:     Rate and Rhythm: Normal rate and regular rhythm.     Pulses: Normal pulses.  Pulmonary:     Effort: Pulmonary effort is normal.     Breath sounds: Normal breath sounds.  Neurological:     General: No focal deficit present.     Mental Status: She is alert and oriented to person, place, and time.     Cranial Nerves: No cranial nerve deficit.     Motor: No weakness.     Coordination: Coordination normal.     Gait: Gait normal.     Comments: Positive nystagmus and dizziness to the right with dix hallpike.   Psychiatric:        Mood and Affect: Mood normal.       .Marland Kitchen  Depression screen [facility] 2/9 08/08/2019 07/07/2018 08/31/2017 06/17/2017 04/06/2017  Decreased Interest 0 0 0 1 3  Down, Depressed, Hopeless 0 0 0 0 1  PHQ - 2 Score 0 0 0 1 4  Altered sleeping 1 - 0 0 2  Tired, decreased energy 0 - 1 1 3   Change in appetite 0 - 1 2 3   Feeling bad or failure about yourself  0 - 0 0 0  Trouble concentrating 0 - 0 0 0  Moving slowly or fidgety/restless 0 - 0 0 0  Suicidal thoughts 0 - 0 0 0  PHQ-9 Score 1 - 2 4 12   Difficult doing work/chores Not difficult at all - Not difficult at all Somewhat difficult Very difficult   . GAD 7 : Generalized Anxiety Score 08/08/2019 07/07/2018 08/31/2017 06/17/2017   Nervous, Anxious, on Edge 1 2 1 1   Control/stop worrying 0 0 0 1  Worry too much - different things 0 0 0 1  Trouble relaxing 1 1 0 0  Restless 0 0 0 0  Easily annoyed or irritable 1 2 1  0  Afraid - awful might happen 0 0 0 0  Total GAD 7 Score 3 5 2 3   Anxiety Difficulty Not difficult at all Not difficult at all Somewhat difficult Not difficult at all        Assessment & Plan:  .7/1/2020[name] was seen today for diabetes.  Diagnoses and all orders for this visit:  Type 2 diabetes mellitus with other specified complication, without long-term current use of insulin (HCC) -     POCT glycosylated hemoglobin (Hb A1C) -     COMPLETE METABOLIC PANEL WITH GFR -     Dulaglutide (TRULICITY) 1.5 MG/0.5ML SOPN; Inject 1.5 mg into the skin once a week.  Essential hypertension, benign -     COMPLETE METABOLIC PANEL WITH GFR  Class 3 severe obesity due to excess calories with serious comorbidity and body mass index (BMI) of 45.0 to 49.9 in adult (HCC) -     CBC with Differential/Platelet -     Lipid Panel w/reflex Direct LDL -     TSH  Hyperlipidemia LDL goal <70 -     Lipid Panel w/reflex Direct LDL  Iron deficiency anemia secondary to inadequate dietary iron intake -     CBC with Differential/Platelet  Thyroid disorder screen -     TSH  Colon cancer screening -     Ambulatory referral to Gastroenterology  Encounter for hepatitis C screening test for low risk patient -     Hepatitis C Antibody  Other orders -     Discontinue: Dulaglutide (TRULICITY) 0.75 MG/0.5ML SOPN; Inject 0.75 mg into the skin once a week.   A1C to goal and stable.  Increased trulicity to help with weight loss.  Down 9lbs in 3 months.  BP go goal. On ARB.  Not taking statin. Lipid level ordered today.  Scheduled eye exam for march.  Foot exam UTD.    Screening labs ordered.  Needs pap.  Colonoscopy ordered due to 45 yo.   Flu UTD/pneumonia UTD/covid 2 shots done needs booster.   Positive dix  hallipike to the right.  epley manuevers given and discussed.  flonase for ear pressure and fluid.  Follow up as needed if symptoms changing or worsening.   Follow up as needed or in 3 months.

## 2020-05-02 NOTE — Patient Instructions (Signed)

## 2020-05-10 LAB — COMPLETE METABOLIC PANEL WITH GFR
AG Ratio: 1.4 (calc) (ref 1.0–2.5)
ALT: 28 U/L (ref 6–29)
AST: 23 U/L (ref 10–35)
Albumin: 4.2 g/dL (ref 3.6–5.1)
Alkaline phosphatase (APISO): 67 U/L (ref 31–125)
BUN: 13 mg/dL (ref 7–25)
CO2: 23 mmol/L (ref 20–32)
Calcium: 10.1 mg/dL (ref 8.6–10.2)
Chloride: 103 mmol/L (ref 98–110)
Creat: 0.65 mg/dL (ref 0.50–1.10)
GFR, Est African American: 124 mL/min/{1.73_m2} (ref >=60)
GFR, Est Non African American: 107 mL/min/{1.73_m2} (ref >=60)
Globulin: 3 g/dL (calc) (ref 1.9–3.7)
Glucose, Bld: 108 mg/dL — ABNORMAL HIGH (ref 65–99)
Potassium: 4.2 mmol/L (ref 3.5–5.3)
Sodium: 139 mmol/L (ref 135–146)
Total Bilirubin: 0.3 mg/dL (ref 0.2–1.2)
Total Protein: 7.2 g/dL (ref 6.1–8.1)

## 2020-05-10 LAB — CBC WITH DIFFERENTIAL/PLATELET
Absolute Monocytes: 530 cells/uL (ref 200–950)
Basophils Absolute: 68 cells/uL (ref 0–200)
Basophils Relative: 1 %
Eosinophils Absolute: 490 cells/uL (ref 15–500)
Eosinophils Relative: 7.2 %
HCT: 38.6 % (ref 35.0–45.0)
Hemoglobin: 12 g/dL (ref 11.7–15.5)
Lymphs Abs: 1911 cells/uL (ref 850–3900)
MCH: 22.5 pg — ABNORMAL LOW (ref 27.0–33.0)
MCHC: 31.1 g/dL — ABNORMAL LOW (ref 32.0–36.0)
MCV: 72.3 fL — ABNORMAL LOW (ref 80.0–100.0)
MPV: 10.1 fL (ref 7.5–12.5)
Monocytes Relative: 7.8 %
Neutro Abs: 3801 cells/uL (ref 1500–7800)
Neutrophils Relative %: 55.9 %
Platelets: 404 10*3/uL — ABNORMAL HIGH (ref 140–400)
RBC: 5.34 10*6/uL — ABNORMAL HIGH (ref 3.80–5.10)
RDW: 15.8 % — ABNORMAL HIGH (ref 11.0–15.0)
Total Lymphocyte: 28.1 %
WBC: 6.8 10*3/uL (ref 3.8–10.8)

## 2020-05-10 LAB — LIPID PANEL W/REFLEX DIRECT LDL
Cholesterol: 174 mg/dL (ref <200)
HDL: 45 mg/dL — ABNORMAL LOW (ref >=50)
LDL Cholesterol (Calc): 107 mg/dL (calc) — ABNORMAL HIGH
Non-HDL Cholesterol (Calc): 129 mg/dL (calc) (ref <130)
Total CHOL/HDL Ratio: 3.9 (calc) (ref <5.0)
Triglycerides: 119 mg/dL (ref <150)

## 2020-05-10 LAB — HEPATITIS C ANTIBODY
Hepatitis C Ab: NONREACTIVE
SIGNAL TO CUT-OFF: 0.01 (ref <1.00)

## 2020-05-10 LAB — TSH: TSH: 3.16 mIU/L

## 2020-05-12 NOTE — Progress Notes (Signed)
Theresa Beard,   Thyroid normal range but not optimal range.  Sugars decreasing. Kidney, liver look great.  Hemoglobin improved from past labs. Break down of RBC does still appear to have low iron properties.  I would suggest MVI with iron and increasing iron rich foods.  LDL up from 1 year ago. Are you taking the atorvastatin?  Theresa Beard,   Thyroid normal range but not optimal range.  Sugars decreasing. Kidney, liver look great.  Hemoglobin improved from past labs. Break down of RBC does still appear to have low iron properties.  I would suggest MVI with iron and increasing iron rich foods.

## 2020-05-13 ENCOUNTER — Encounter: Payer: Self-pay | Admitting: Physician Assistant

## 2020-06-24 ENCOUNTER — Other Ambulatory Visit (HOSPITAL_BASED_OUTPATIENT_CLINIC_OR_DEPARTMENT_OTHER): Payer: Self-pay

## 2020-08-07 ENCOUNTER — Encounter: Payer: Self-pay | Admitting: Physician Assistant

## 2020-08-07 ENCOUNTER — Other Ambulatory Visit (HOSPITAL_BASED_OUTPATIENT_CLINIC_OR_DEPARTMENT_OTHER): Payer: Self-pay

## 2020-08-07 DIAGNOSIS — E1169 Type 2 diabetes mellitus with other specified complication: Secondary | ICD-10-CM

## 2020-08-07 MED ORDER — DULAGLUTIDE 1.5 MG/0.5ML ~~LOC~~ SOAJ
1.5000 mg | SUBCUTANEOUS | 0 refills | Status: DC
  Filled 2020-08-07: qty 6, 84d supply, fill #0

## 2020-08-08 ENCOUNTER — Other Ambulatory Visit (HOSPITAL_BASED_OUTPATIENT_CLINIC_OR_DEPARTMENT_OTHER): Payer: Self-pay

## 2020-08-16 IMAGING — DX RIGHT FOOT COMPLETE - 3+ VIEW
3 series · 3 of 3 positions shown · non-contrast
Comparison: None.

CLINICAL DATA: Trauma to the second and third toes with pain,
initial encounter

EXAM:
RIGHT FOOT COMPLETE - 3+ VIEW

[foot ap]
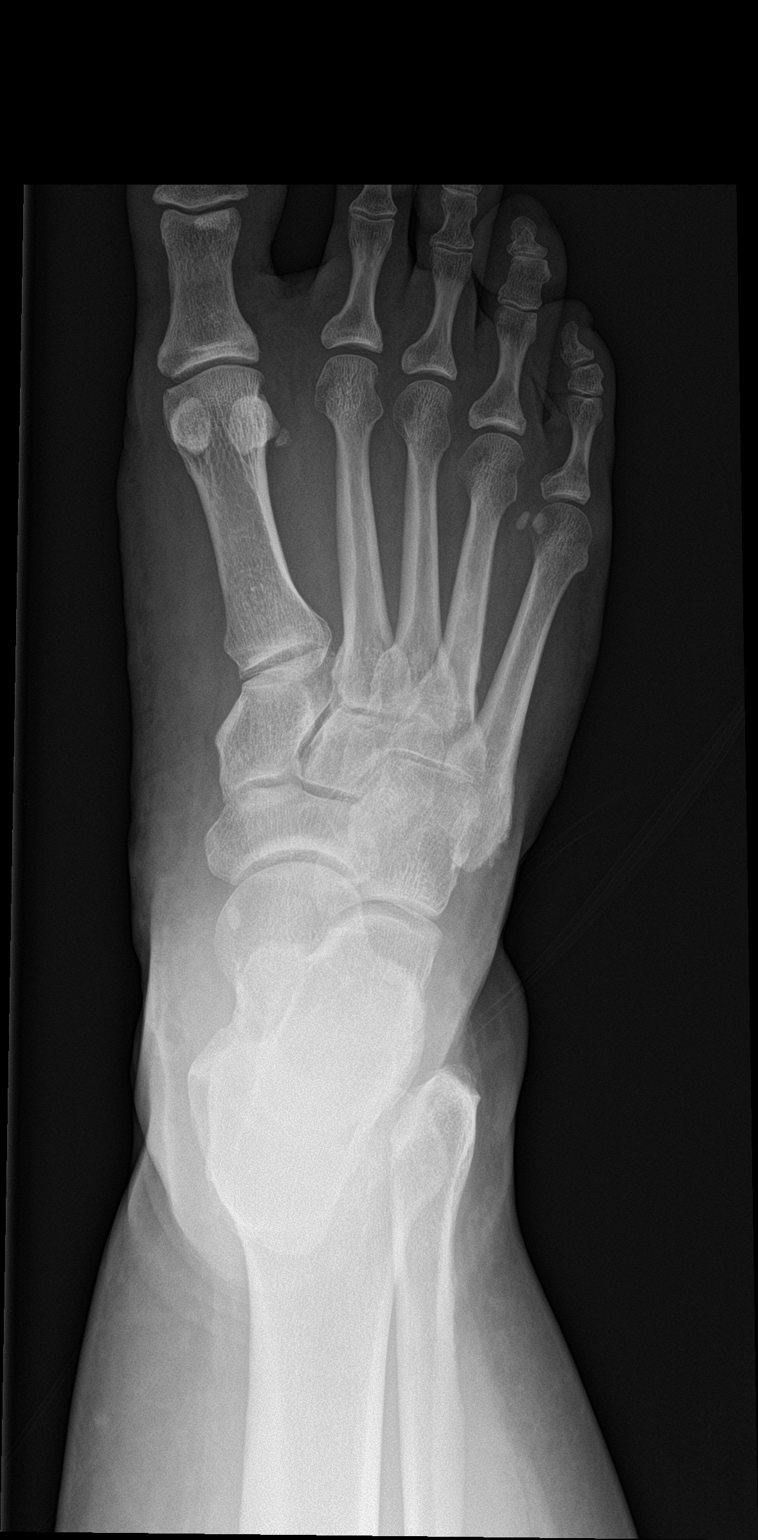

[foot obl]
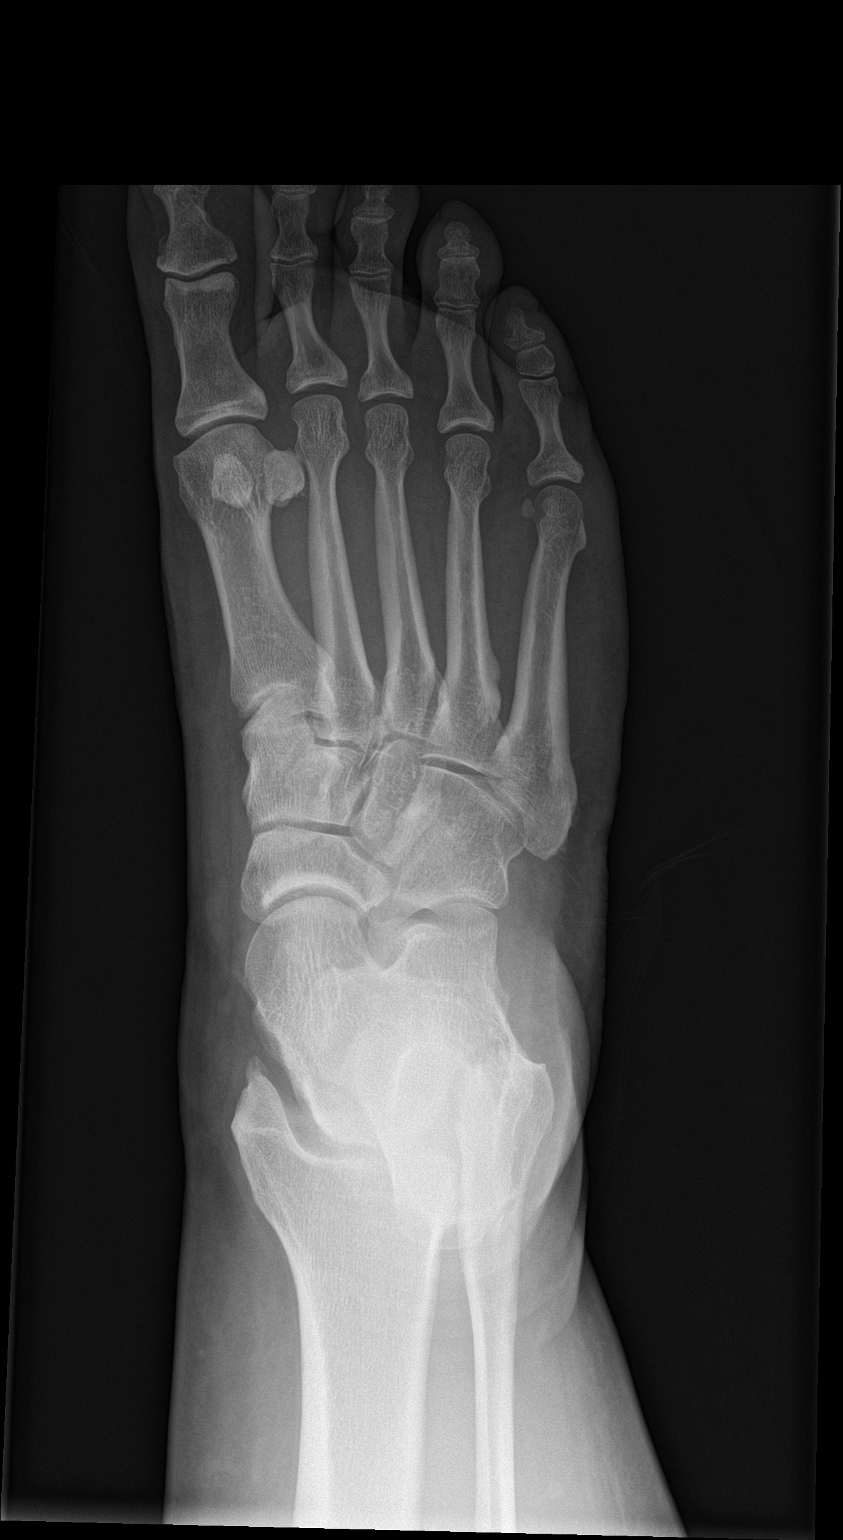

[foot lat]
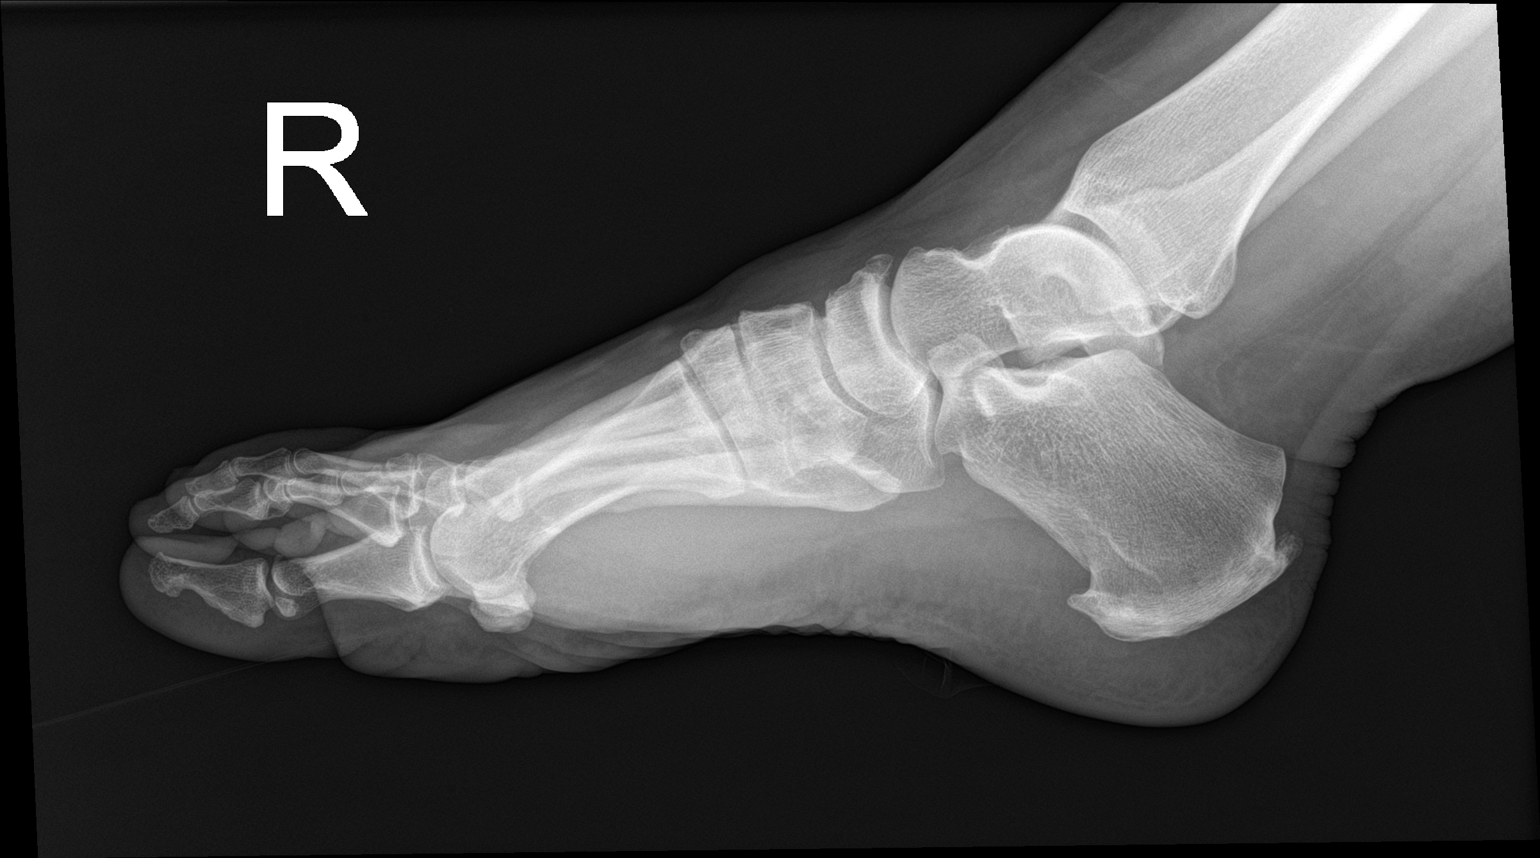

[3 of 3 positions shown; findings below may reference images not displayed]

FINDINGS: Mild calcaneal spurs are noted. No acute fracture or dislocation is
noted. No soft tissue abnormality is seen.
IMPRESSION: No acute abnormality noted.

## 2020-08-26 ENCOUNTER — Other Ambulatory Visit: Payer: Self-pay

## 2020-08-26 ENCOUNTER — Ambulatory Visit (INDEPENDENT_AMBULATORY_CARE_PROVIDER_SITE_OTHER): Payer: No Typology Code available for payment source | Admitting: Physician Assistant

## 2020-08-26 ENCOUNTER — Other Ambulatory Visit (HOSPITAL_BASED_OUTPATIENT_CLINIC_OR_DEPARTMENT_OTHER): Payer: Self-pay

## 2020-08-26 VITALS — BP 147/82 | HR 75 | Ht 64.0 in | Wt 271.0 lb

## 2020-08-26 DIAGNOSIS — E785 Hyperlipidemia, unspecified: Secondary | ICD-10-CM | POA: Diagnosis not present

## 2020-08-26 DIAGNOSIS — Z6841 Body Mass Index (BMI) 40.0 and over, adult: Secondary | ICD-10-CM

## 2020-08-26 DIAGNOSIS — I10 Essential (primary) hypertension: Secondary | ICD-10-CM | POA: Diagnosis not present

## 2020-08-26 DIAGNOSIS — D508 Other iron deficiency anemias: Secondary | ICD-10-CM

## 2020-08-26 DIAGNOSIS — K219 Gastro-esophageal reflux disease without esophagitis: Secondary | ICD-10-CM

## 2020-08-26 DIAGNOSIS — E1169 Type 2 diabetes mellitus with other specified complication: Secondary | ICD-10-CM

## 2020-08-26 DIAGNOSIS — Z1231 Encounter for screening mammogram for malignant neoplasm of breast: Secondary | ICD-10-CM

## 2020-08-26 LAB — POCT GLYCOSYLATED HEMOGLOBIN (HGB A1C): Hemoglobin A1C: 6 % — AB (ref 4.0–5.6)

## 2020-08-26 MED ORDER — FUSION PLUS PO CAPS
1.0000 | ORAL_CAPSULE | Freq: Every day | ORAL | 3 refills | Status: DC
  Filled 2020-08-26 – 2020-10-21 (×2): qty 90, 90d supply, fill #0

## 2020-08-26 MED ORDER — DULAGLUTIDE 1.5 MG/0.5ML ~~LOC~~ SOAJ
1.5000 mg | SUBCUTANEOUS | 0 refills | Status: DC
  Filled 2020-08-26: qty 6, 84d supply, fill #0

## 2020-08-26 MED ORDER — ESOMEPRAZOLE MAGNESIUM 40 MG PO CPDR
40.0000 mg | DELAYED_RELEASE_CAPSULE | Freq: Every day | ORAL | 3 refills | Status: DC
  Filled 2020-08-26 – 2021-06-25 (×2): qty 90, 90d supply, fill #0

## 2020-08-26 MED ORDER — LOSARTAN POTASSIUM 50 MG PO TABS
50.0000 mg | ORAL_TABLET | Freq: Every day | ORAL | 1 refills | Status: DC
  Filled 2020-08-26 – 2020-10-21 (×2): qty 90, 90d supply, fill #0

## 2020-08-26 MED ORDER — ATORVASTATIN CALCIUM 10 MG PO TABS
10.0000 mg | ORAL_TABLET | Freq: Every day | ORAL | 3 refills | Status: DC
  Filled 2020-08-26 – 2020-10-21 (×2): qty 90, 90d supply, fill #0
  Filled 2021-04-02: qty 90, 90d supply, fill #1

## 2020-08-26 NOTE — Progress Notes (Signed)
Subjective:    Patient ID: Theresa Beard, female    DOB: [birth year], 45 y.o.   MRN: [id number]  HPI Pt is a 45 yo obese female with HTN, T2DM, HLD, anemia who presents to the clinic for 3 month follow up and medication refills.  HTN- she forgot her BP medication today. She reports to be pretty compliant. No CP, palpitations, headaches or vision changes.   T2DM- not checking sugars. Denies any hypoglycemia. She is not losing weight but eating better and feels like "she is losing inches". Taking trulicity only and doing really well. No concerns or complaints. No open sores or wounds.   Hx of anemia but does not tolerate iron well. Would like to see if fusion plus is more affordable now.   .. 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   Cervical degenerative disc disease 11/05/2011   Gastroesophageal reflux disease without esophagitis 01/11/2012   Chondromalacia of left patellofemoral joint 03/27/2012   Anxiety state 11/05/2013   Microcytic anemia 11/07/2013   Vitamin D insufficiency 11/07/2013   Trouble in sleeping 01/27/2015   Diabetes mellitus (HCC) 01/27/2015   Class 3 severe obesity due to excess calories with serious comorbidity and body mass index (BMI) of 45.0 to 49.9 in adult (HCC) 07/11/2015   Stress at work 04/06/2017   Irritable 04/06/2017   Acquired skin tag 09/01/2017   Tendinitis of left triceps 09/02/2017   Plantar fasciitis of left foot 09/02/2017   Elevated fasting glucose 05/14/2019   Medication side effect, initial encounter 11/21/2019   Hyperlipidemia LDL goal <70 05/02/2020   Iron deficiency anemia secondary to inadequate dietary iron intake 05/02/2020   Resolved Ambulatory Problems    Diagnosis Date Noted   CHOLECYSTITIS - ACUTE 12/11/2009   ABDOMINAL PAIN, ACUTE 12/11/2009   Peroneal tendinitis of left lower extremity 11/23/2011   Obesity 11/23/2011    Abnormality of gait 12/14/2011   Influenza-like illness 04/21/2012   Subacromial bursitis 03/16/2013   Skin lesion 04/10/2013   Pre-diabetes 04/06/2015   Pain of left thumb 07/28/2015   Medial epicondylitis, left 09/01/2017   No Additional Past Medical History         Review of Systems  All other systems reviewed and are negative.     Objective:   Physical Exam Vitals reviewed.  Constitutional:      Appearance: Normal appearance. She is obese.  HENT:     Head: Normocephalic.  Neck:     Vascular: No carotid bruit.  Cardiovascular:     Rate and Rhythm: Normal rate and regular rhythm.     Pulses: Normal pulses.     Heart sounds: Normal heart sounds.  Pulmonary:     Effort: Pulmonary effort is normal.     Breath sounds: Normal breath sounds.  Neurological:     General: No focal deficit present.     Mental Status: She is alert and oriented to person, place, and time.  Psychiatric:        Mood and Affect: Mood normal.         .. Results for orders placed or performed in visit on 08/26/20  POCT glycosylated hemoglobin (Hb A1C)  Result Value Ref Range   Hemoglobin A1C 6.0 (A) 4.0 - 5.6 %   HbA1c POC (<> result, manual entry)     HbA1c, POC (prediabetic range)     HbA1c, POC (controlled diabetic range)  Assessment & Plan:  .Marland KitchenJitruttana was seen today for diabetes.  Diagnoses and all orders for this visit:  Type 2 diabetes mellitus with other specified complication, without long-term current use of insulin (HCC) -     POCT glycosylated hemoglobin (Hb A1C) -     Dulaglutide 1.5 MG/0.5ML SOPN; INJECT 1.5 MG INTO THE SKIN ONCE A WEEK.  Gastroesophageal reflux disease without esophagitis -     esomeprazole (NEXIUM) 40 MG capsule; Take 1 capsule (40 mg total) by mouth daily.  Hyperlipidemia LDL goal <70 -     atorvastatin (LIPITOR) 10 MG tablet; Take 1 tablet (10 mg total) by mouth daily.  HYPERTENSION, MILD -     losartan (COZAAR) 50 MG tablet; Take 1  tablet (50 mg total) by mouth daily.  Visit for screening mammogram -     MM 3D SCREEN BREAST BILATERAL  Class 3 severe obesity due to excess calories with serious comorbidity and body mass index (BMI) of 45.0 to 49.9 in adult [facility])  Iron deficiency anemia secondary to inadequate dietary iron intake -     Iron-FA-B Cmp-C-Biot-Probiotic (FUSION PLUS) CAPS; Take 1 tablet by mouth daily.  A1C to goal.  Stay on same medications.  BP not to goal but forgot meds this morning. On ARB. On STATIN. Refilled today.  Needs eye exam. Pt aware.  UTD covid/pneumonia vaccine.  Follow up in 3 months.   Fusion plus sent to try for anemia.    Needs mammogram. Order placed.  Needs colonoscopy. Referral already made needs to call and schedule appt.  Needs pap.

## 2020-08-27 ENCOUNTER — Encounter: Payer: Self-pay | Admitting: Physician Assistant

## 2020-09-05 ENCOUNTER — Other Ambulatory Visit (HOSPITAL_BASED_OUTPATIENT_CLINIC_OR_DEPARTMENT_OTHER): Payer: Self-pay

## 2020-10-21 ENCOUNTER — Other Ambulatory Visit (HOSPITAL_BASED_OUTPATIENT_CLINIC_OR_DEPARTMENT_OTHER): Payer: Self-pay

## 2020-11-21 ENCOUNTER — Other Ambulatory Visit: Payer: Self-pay

## 2020-11-21 ENCOUNTER — Other Ambulatory Visit (HOSPITAL_BASED_OUTPATIENT_CLINIC_OR_DEPARTMENT_OTHER): Payer: Self-pay

## 2020-11-21 DIAGNOSIS — E1169 Type 2 diabetes mellitus with other specified complication: Secondary | ICD-10-CM

## 2020-11-21 MED ORDER — DULAGLUTIDE 1.5 MG/0.5ML ~~LOC~~ SOAJ
1.5000 mg | SUBCUTANEOUS | 0 refills | Status: DC
  Filled 2020-11-21: qty 2, 28d supply, fill #0

## 2020-11-25 ENCOUNTER — Encounter: Payer: Self-pay | Admitting: Physician Assistant

## 2020-11-25 ENCOUNTER — Ambulatory Visit (INDEPENDENT_AMBULATORY_CARE_PROVIDER_SITE_OTHER): Payer: No Typology Code available for payment source | Admitting: Physician Assistant

## 2020-11-25 ENCOUNTER — Other Ambulatory Visit (HOSPITAL_BASED_OUTPATIENT_CLINIC_OR_DEPARTMENT_OTHER): Payer: Self-pay

## 2020-11-25 ENCOUNTER — Other Ambulatory Visit: Payer: Self-pay

## 2020-11-25 VITALS — BP 142/77 | HR 79 | Ht 64.0 in | Wt 268.0 lb

## 2020-11-25 DIAGNOSIS — D508 Other iron deficiency anemias: Secondary | ICD-10-CM

## 2020-11-25 DIAGNOSIS — I10 Essential (primary) hypertension: Secondary | ICD-10-CM

## 2020-11-25 DIAGNOSIS — E785 Hyperlipidemia, unspecified: Secondary | ICD-10-CM | POA: Diagnosis not present

## 2020-11-25 DIAGNOSIS — Z23 Encounter for immunization: Secondary | ICD-10-CM

## 2020-11-25 DIAGNOSIS — Z1211 Encounter for screening for malignant neoplasm of colon: Secondary | ICD-10-CM

## 2020-11-25 DIAGNOSIS — E1169 Type 2 diabetes mellitus with other specified complication: Secondary | ICD-10-CM

## 2020-11-25 DIAGNOSIS — Z6841 Body Mass Index (BMI) 40.0 and over, adult: Secondary | ICD-10-CM

## 2020-11-25 LAB — POCT GLYCOSYLATED HEMOGLOBIN (HGB A1C): HbA1c, POC (prediabetic range): 6.2 % (ref 5.7–6.4)

## 2020-11-25 MED ORDER — DULAGLUTIDE 1.5 MG/0.5ML ~~LOC~~ SOAJ
1.5000 mg | SUBCUTANEOUS | 1 refills | Status: DC
  Filled 2020-11-25 – 2020-12-29 (×2): qty 6, 84d supply, fill #0

## 2020-11-25 NOTE — Progress Notes (Signed)
Subjective:    Patient ID: Theresa Beard, female    DOB: [birth year], 45 y.o.   MRN: [id number]  HPI Patient is a 45 year old female with type 2 diabetes, hypertension, hyperlipidemia on, MDD, GAD who presents to the clinic for 53-month follow-up.  Patient is not checking her sugars.  Patient is very compliant with Trulicity.  She denies any side effects.  She admits she is not exercising or watching her diet.  She denies any hypoglycemic events.  She denies any open sores or wounds.  She denies any numbness and tingling of extremities.  She does not check her blood pressure.  She denies any chest pain, palpitations, headaches or vision changes.  She is compliant with her losartan 50 mg.  She just took a new job and she is very excited about this possibility.  Her mood is very well controlled.    Review of Systems See HPI.     Objective:   Physical Exam Vitals reviewed.  Constitutional:      Appearance: Normal appearance. She is obese.  HENT:     Head: Normocephalic.  Neck:     Vascular: No carotid bruit.  Cardiovascular:     Rate and Rhythm: Normal rate and regular rhythm.     Pulses: Normal pulses.     Heart sounds: Normal heart sounds. No murmur heard. Pulmonary:     Effort: Pulmonary effort is normal.     Breath sounds: Normal breath sounds.  Neurological:     General: No focal deficit present.     Mental Status: She is alert and oriented to person, place, and time.  Psychiatric:        Mood and Affect: Mood normal.      .. Results for orders placed or performed in visit on 11/25/20  POCT glycosylated hemoglobin (Hb A1C)  Result Value Ref Range   Hemoglobin A1C     HbA1c POC (<> result, manual entry)     HbA1c, POC (prediabetic range) 6.2 5.7 - 6.4 %   HbA1c, POC (controlled diabetic range)         Assessment & Plan:  Marland KitchenMarland KitchenDiagnoses and all orders for this visit:  Type 2 diabetes mellitus with other specified complication, without long-term current  use of insulin (HCC) -     POCT glycosylated hemoglobin (Hb A1C) -     COMPLETE METABOLIC PANEL WITH GFR -     Dulaglutide 1.5 MG/0.5ML SOPN; Inject 1.5 mg into the skin once a week.  Hyperlipidemia LDL goal <70 -     Lipid Panel w/reflex Direct LDL  HYPERTENSION, MILD -     losartan (COZAAR) 100 MG tablet; Take 1 tablet (100 mg total) by mouth daily.  Colon cancer screening -     Cologuard  Iron deficiency anemia secondary to inadequate dietary iron intake -     Fe+TIBC+Fer -     CBC with Differential/Platelet  Need for influenza vaccination -     Flu Vaccine QUAD 6+ mos PF IM (Fluarix Quad PF)  Class 3 severe obesity due to excess calories with serious comorbidity and body mass index (BMI) of 45.0 to 49.9 in adult (HCC)  A1C to goal.  Continue trulicity.  Continue diet and exercise to continue to lose weight.  On ARB. BP not to goal. Increased losaartan to 100mg  daily.  On statin. Labs ordered.  Foot exam UTD.  .. Diabetic Foot Exam - Simple   Simple Foot Form  11/25/2020  1:08 PM  Visual  Inspection No deformities, no ulcerations, no other skin breakdown bilaterally: Yes Sensation Testing Intact to touch and monofilament testing bilaterally: Yes Pulse Check Posterior Tibialis and Dorsalis pulse intact bilaterally: Yes Comments    Pneumonia UTD.  Flu shot given today.  Covid vaccine x2.  Follow up in 3 months.   Recheck IDA.   Cologuard ordered for colon cancer screening.

## 2020-11-26 ENCOUNTER — Other Ambulatory Visit (HOSPITAL_BASED_OUTPATIENT_CLINIC_OR_DEPARTMENT_OTHER): Payer: Self-pay

## 2020-11-26 MED ORDER — LOSARTAN POTASSIUM 100 MG PO TABS
100.0000 mg | ORAL_TABLET | Freq: Every day | ORAL | 1 refills | Status: DC
  Filled 2020-11-26 – 2020-12-29 (×2): qty 90, 90d supply, fill #0

## 2020-12-08 ENCOUNTER — Other Ambulatory Visit (HOSPITAL_BASED_OUTPATIENT_CLINIC_OR_DEPARTMENT_OTHER): Payer: Self-pay

## 2020-12-29 ENCOUNTER — Other Ambulatory Visit (HOSPITAL_BASED_OUTPATIENT_CLINIC_OR_DEPARTMENT_OTHER): Payer: Self-pay

## 2021-03-03 ENCOUNTER — Other Ambulatory Visit: Payer: Self-pay

## 2021-03-03 ENCOUNTER — Other Ambulatory Visit (HOSPITAL_BASED_OUTPATIENT_CLINIC_OR_DEPARTMENT_OTHER): Payer: Self-pay

## 2021-03-03 ENCOUNTER — Ambulatory Visit (INDEPENDENT_AMBULATORY_CARE_PROVIDER_SITE_OTHER): Payer: No Typology Code available for payment source | Admitting: Physician Assistant

## 2021-03-03 ENCOUNTER — Encounter: Payer: Self-pay | Admitting: Physician Assistant

## 2021-03-03 VITALS — BP 140/90 | HR 105 | Ht 64.0 in | Wt 267.0 lb

## 2021-03-03 DIAGNOSIS — E66813 Obesity, class 3: Secondary | ICD-10-CM

## 2021-03-03 DIAGNOSIS — E1169 Type 2 diabetes mellitus with other specified complication: Secondary | ICD-10-CM | POA: Diagnosis not present

## 2021-03-03 DIAGNOSIS — Z566 Other physical and mental strain related to work: Secondary | ICD-10-CM

## 2021-03-03 DIAGNOSIS — F411 Generalized anxiety disorder: Secondary | ICD-10-CM

## 2021-03-03 DIAGNOSIS — E785 Hyperlipidemia, unspecified: Secondary | ICD-10-CM

## 2021-03-03 DIAGNOSIS — I10 Essential (primary) hypertension: Secondary | ICD-10-CM | POA: Diagnosis not present

## 2021-03-03 DIAGNOSIS — Z6841 Body Mass Index (BMI) 40.0 and over, adult: Secondary | ICD-10-CM

## 2021-03-03 LAB — POCT GLYCOSYLATED HEMOGLOBIN (HGB A1C): Hemoglobin A1C: 5.9 % — AB (ref 4.0–5.6)

## 2021-03-03 MED ORDER — LOSARTAN POTASSIUM 100 MG PO TABS
100.0000 mg | ORAL_TABLET | Freq: Every day | ORAL | 1 refills | Status: DC
  Filled 2021-03-03 – 2021-04-02 (×2): qty 90, 90d supply, fill #0

## 2021-03-03 MED ORDER — HYDROXYZINE HCL 10 MG PO TABS
10.0000 mg | ORAL_TABLET | Freq: Three times a day (TID) | ORAL | 0 refills | Status: DC | PRN
  Filled 2021-03-03: qty 90, 30d supply, fill #0

## 2021-03-03 MED ORDER — ESCITALOPRAM OXALATE 10 MG PO TABS
10.0000 mg | ORAL_TABLET | Freq: Every day | ORAL | 0 refills | Status: DC
  Filled 2021-03-03: qty 90, 90d supply, fill #0

## 2021-03-03 MED ORDER — BUPROPION HCL ER (XL) 150 MG PO TB24
150.0000 mg | ORAL_TABLET | Freq: Every day | ORAL | 0 refills | Status: DC
Start: 2021-03-03 — End: 2021-05-13
  Filled 2021-03-03: qty 90, 90d supply, fill #0

## 2021-03-03 MED ORDER — DULAGLUTIDE 1.5 MG/0.5ML ~~LOC~~ SOAJ
1.5000 mg | SUBCUTANEOUS | 1 refills | Status: DC
  Filled 2021-03-03 – 2021-04-02 (×2): qty 6, 84d supply, fill #0

## 2021-03-03 NOTE — Progress Notes (Signed)
Subjective:    Patient ID: Theresa Beard, female    DOB: 1975-11-02, 45 y.o.   MRN: [id number]  HPI Pt is a 45 yo female with HTN, T2DM, GERD, Depression, Anxiety who presents to the clinic for follow up.   Pt is taking her trulicity weekly. She is not exercising or eating the way she should. She has not lost any weight. No open wounds or sores. No hypoglycemic events. Tolerating medication well. Not checking sugars.   No CP, palpitations, headaches. She is taking cozaar but was really stressed coming in today. Home BP readings are in the 130s over 80s.   Pt did stop her mood medication and depression and anxiety have worsened. She has had a really stressful job transition. Her boss has not trained her well and does not feel supported. She would like to go back on medication. At some points she just feels like her chest is heavy and "I can't breathe".   .. 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   Cervical degenerative disc disease 11/05/2011   Gastroesophageal reflux disease without esophagitis 01/11/2012   Chondromalacia of left patellofemoral joint 03/27/2012   Anxiety state 11/05/2013   Microcytic anemia 11/07/2013   Vitamin D insufficiency 11/07/2013   Trouble in sleeping 01/27/2015   Diabetes mellitus (HCC) 01/27/2015   Class 3 severe obesity due to excess calories with serious comorbidity and body mass index (BMI) of 45.0 to 49.9 in adult (HCC) 07/11/2015   Stress at work 04/06/2017   Irritable 04/06/2017   Acquired skin tag 09/01/2017   Tendinitis of left triceps 09/02/2017   Plantar fasciitis of left foot 09/02/2017   Elevated fasting glucose 05/14/2019   Medication side effect, initial encounter 11/21/2019   Hyperlipidemia LDL goal <70 05/02/2020   Iron deficiency anemia secondary to inadequate dietary iron intake 05/02/2020   Resolved Ambulatory Problems    Diagnosis  Date Noted   CHOLECYSTITIS - ACUTE 12/11/2009   ABDOMINAL PAIN, ACUTE 12/11/2009   Peroneal tendinitis of left lower extremity 11/23/2011   Obesity 11/23/2011   Abnormality of gait 12/14/2011   Influenza-like illness 04/21/2012   Subacromial bursitis 03/16/2013   Skin lesion 04/10/2013   Pre-diabetes 04/06/2015   Pain of left thumb 07/28/2015   Medial epicondylitis, left 09/01/2017   No Additional Past Medical History     Review of Systems See HPI.     Objective:   Physical Exam Vitals reviewed.  Constitutional:      Appearance: Normal appearance.  HENT:     Head: Normocephalic.  Cardiovascular:     Rate and Rhythm: Normal rate and regular rhythm.     Heart sounds: No murmur heard. Pulmonary:     Effort: Pulmonary effort is normal.     Breath sounds: Normal breath sounds.  Musculoskeletal:     Right lower leg: No edema.     Left lower leg: No edema.  Neurological:     General: No focal deficit present.     Mental Status: She is alert and oriented to person, place, and time.  Psychiatric:     Comments: tearful   .Marland Kitchen Depression screen [facility] 2/9 03/03/2021 08/26/2020 08/08/2019 07/07/2018 08/31/2017  Decreased Interest 3 0 0 0 0  Down, Depressed, Hopeless 3 0 0 0 0  PHQ - 2 Score 6 0 0 0 0  Altered sleeping 3 - 1 - 0  Tired, decreased energy 3 -  0 - 1  Change in appetite 2 - 0 - 1  Feeling bad or failure about yourself  1 - 0 - 0  Trouble concentrating 2 - 0 - 0  Moving slowly or fidgety/restless - - 0 - 0  Suicidal thoughts 0 - 0 - 0  PHQ-9 Score 17 - 1 - 2  Difficult doing work/chores Extremely dIfficult - Not difficult at all - Not difficult at all   .Marland Kitchen GAD 7 : Generalized Anxiety Score 03/03/2021 08/08/2019 07/07/2018 08/31/2017  Nervous, Anxious, on Edge 3 1 2 1   Control/stop worrying 3 0 0 0  Worry too much - different things 3 0 0 0  Trouble relaxing 3 1 1  0  Restless 1 0 0 0  Easily annoyed or irritable 1 1 2 1   Afraid - awful might happen 3 0 0 0  Total GAD 7  Score 17 3 5 2   Anxiety Difficulty Extremely difficult Not difficult at all Not difficult at all Somewhat difficult       . Results for orders placed or performed in visit on 03/03/21  POCT glycosylated hemoglobin (Hb A1C)  Result Value Ref Range   Hemoglobin A1C 5.9 (A) 4.0 - 5.6 %   HbA1c POC (<> result, manual entry)     HbA1c, POC (prediabetic range)     HbA1c, POC (controlled diabetic range)         Assessment & Plan:  . Magdalena was seen today for follow-up.  Diagnoses and all orders for this visit:  Anxiety state -     escitalopram (LEXAPRO) 10 MG tablet; Take 1 tablet (10 mg total) by mouth at bedtime. -     buPROPion (WELLBUTRIN XL) 150 MG 24 hr tablet; Take 1 tablet (150 mg total) by mouth daily. -     hydrOXYzine (ATARAX) 10 MG tablet; Take 1 tablet (10 mg total) by mouth 3 (three) times daily as needed for anxiety.  Type 2 diabetes mellitus with other specified complication, without long-term current use of insulin (HCC) -     POCT glycosylated hemoglobin (Hb A1C) -     Dulaglutide 1.5 MG/0.5ML SOPN; Inject 1.5 mg into the skin once a week.  Hyperlipidemia LDL goal <70  HYPERTENSION, MILD -     losartan (COZAAR) 100 MG tablet; Take 1 tablet (100 mg total) by mouth daily.  Class 3 severe obesity due to excess calories with serious comorbidity and body mass index (BMI) of 45.0 to 49.9 in adult (HCC) -     buPROPion (WELLBUTRIN XL) 150 MG 24 hr tablet; Take 1 tablet (150 mg total) by mouth daily.  Stress at work -     escitalopram (LEXAPRO) 10 MG tablet; Take 1 tablet (10 mg total) by mouth at bedtime. -     buPROPion (WELLBUTRIN XL) 150 MG 24 hr tablet; Take 1 tablet (150 mg total) by mouth daily. -     hydrOXYzine (ATARAX) 10 MG tablet; Take 1 tablet (10 mg total) by mouth 3 (three) times daily as needed for anxiety.   A1C is to goal.  Continue same medications.  BP better on 2nd recheck but not to goal. Discussed follow up in 6 weeks and see if  controlling anxiety helps BP.  On statin.  Needs foot exam.  UtD eye exam.  Covid vaccine x2.  Flu and pneumonia shot UTD.  Follow up in 3 months.   Discussed anxiety. Start back on wellbutrin and lexapro. Vistaril as needed.  Discussed breathing, mediatation.  Follow up in 6 weeks.

## 2021-03-12 ENCOUNTER — Encounter: Payer: Self-pay | Admitting: Physician Assistant

## 2021-03-13 ENCOUNTER — Ambulatory Visit (INDEPENDENT_AMBULATORY_CARE_PROVIDER_SITE_OTHER): Payer: No Typology Code available for payment source | Admitting: Physician Assistant

## 2021-03-13 ENCOUNTER — Other Ambulatory Visit (HOSPITAL_BASED_OUTPATIENT_CLINIC_OR_DEPARTMENT_OTHER): Payer: Self-pay

## 2021-03-13 ENCOUNTER — Other Ambulatory Visit: Payer: Self-pay

## 2021-03-13 VITALS — BP 145/81 | HR 82 | Ht 64.0 in | Wt 261.0 lb

## 2021-03-13 DIAGNOSIS — G479 Sleep disorder, unspecified: Secondary | ICD-10-CM | POA: Diagnosis not present

## 2021-03-13 DIAGNOSIS — F410 Panic disorder [episodic paroxysmal anxiety] without agoraphobia: Secondary | ICD-10-CM

## 2021-03-13 DIAGNOSIS — Z566 Other physical and mental strain related to work: Secondary | ICD-10-CM

## 2021-03-13 DIAGNOSIS — F430 Acute stress reaction: Secondary | ICD-10-CM

## 2021-03-13 MED ORDER — TRAZODONE HCL 50 MG PO TABS
25.0000 mg | ORAL_TABLET | Freq: Every evening | ORAL | 2 refills | Status: DC | PRN
  Filled 2021-03-13: qty 30, 30d supply, fill #0

## 2021-03-13 NOTE — Patient Instructions (Addendum)
Wellbutrin Lexapro Hydroxyzine as needed for anxiety Trazodone for sleep

## 2021-03-16 ENCOUNTER — Encounter: Payer: Self-pay | Admitting: Physician Assistant

## 2021-03-16 DIAGNOSIS — F410 Panic disorder [episodic paroxysmal anxiety] without agoraphobia: Secondary | ICD-10-CM | POA: Insufficient documentation

## 2021-03-16 DIAGNOSIS — F430 Acute stress reaction: Secondary | ICD-10-CM | POA: Insufficient documentation

## 2021-03-16 NOTE — Progress Notes (Signed)
Subjective:    Patient ID: Theresa Beard, female    DOB: 01-Nov-1975, 46 y.o.   MRN: [id number]  HPI Pt is a 46 yo female with anxiety and depression who presents to the clinic to discuss anxiety. She reports " I am really bad and not able to hold it together". Denies any SI/HC. Her work is her trigger right now. 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 She felt "humiliated". She feels like she has "walk on egg shells at work". She is having trouble concentrating. She is very tearful. She has extreme painic feelings when she thinks about going to work. She is not sleeping. She is just worried about her future and how she can work there again. Hydroxyzine helps when she takes it but did not know she could take it during the day.   .. 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   Cervical degenerative disc disease 11/05/2011   Gastroesophageal reflux disease without esophagitis 01/11/2012   Chondromalacia of left patellofemoral joint 03/27/2012   Anxiety state 11/05/2013   Microcytic anemia 11/07/2013   Vitamin D insufficiency 11/07/2013   Trouble in sleeping 01/27/2015   Diabetes mellitus (HCC) 01/27/2015   Class 3 severe obesity due to excess calories with serious comorbidity and body mass index (BMI) of 45.0 to 49.9 in adult (HCC) 07/11/2015   Stress at work 04/06/2017   Irritable 04/06/2017   Acquired skin tag 09/01/2017   Tendinitis of left triceps 09/02/2017   Plantar fasciitis of left foot 09/02/2017   Elevated fasting glucose 05/14/2019   Medication side effect, initial encounter 11/21/2019   Hyperlipidemia LDL goal <70 05/02/2020   Iron deficiency anemia secondary to inadequate dietary iron intake 05/02/2020   Panic attacks 03/16/2021   Acute reaction to stress 03/16/2021   Resolved Ambulatory Problems    Diagnosis Date Noted   CHOLECYSTITIS - ACUTE  12/11/2009   ABDOMINAL PAIN, ACUTE 12/11/2009   Peroneal tendinitis of left lower extremity 11/23/2011   Obesity 11/23/2011   Abnormality of gait 12/14/2011   Influenza-like illness 04/21/2012   Subacromial bursitis 03/16/2013   Skin lesion 04/10/2013   Pre-diabetes 04/06/2015   Pain of left thumb 07/28/2015   Medial epicondylitis, left 09/01/2017   No Additional Past Medical History    Review of Systems See HPI.     Objective:   Physical Exam Vitals reviewed.  Constitutional:      Appearance: Normal appearance. She is obese.  HENT:     Head: Normocephalic.  Cardiovascular:     Rate and Rhythm: Normal rate and regular rhythm.     Pulses: Normal pulses.  Musculoskeletal:     Right lower leg: No edema.     Left lower leg: No edema.  Neurological:     General: No focal deficit present.     Mental Status: She is alert and oriented to person, place, and time.  Psychiatric:     Comments: Very tearful Anxious appearing   .Marland Kitchen Depression screen [facility] 2/9 03/03/2021 08/26/2020 08/08/2019 07/07/2018 08/31/2017  Decreased Interest 3 0 0 0 0  Down, Depressed, Hopeless 3 0 0 0 0  PHQ - 2 Score 6 0 0 0 0  Altered sleeping 3 - 1 - 0  Tired, decreased energy 3 - 0 - 1  Change in appetite 2 - 0 - 1  Feeling bad or failure about yourself  1 - 0 - 0  Trouble concentrating 2 - 0 - 0  Moving slowly or fidgety/restless - - 0 - 0  Suicidal thoughts 0 - 0 - 0  PHQ-9 Score 17 - 1 - 2  Difficult doing work/chores Extremely dIfficult - Not difficult at all - Not difficult at all   .Marland Kitchen GAD 7 : Generalized Anxiety Score 03/03/2021 08/08/2019 07/07/2018 08/31/2017  Nervous, Anxious, on Edge 3 1 2 1   Control/stop worrying 3 0 0 0  Worry too much - different things 3 0 0 0  Trouble relaxing 3 1 1  0  Restless 1 0 0 0  Easily annoyed or irritable 1 1 2 1   Afraid - awful might happen 3 0 0 0  Total GAD 7 Score 17 3 5 2   Anxiety Difficulty Extremely difficult Not difficult at all Not difficult at all  Somewhat difficult            Assessment & Plan:  . Theresa Beard was seen today for follow-up.  Diagnoses and all orders for this visit:  Acute reaction to stress -     Ambulatory referral to Psychology  Stress at work -     Ambulatory referral to Psychology  Trouble in sleeping -     Ambulatory referral to Psychology -     traZODone (DESYREL) 50 MG tablet; Take 1/2 -1 tablet (25-50 mg total) by mouth at bedtime as needed for sleep.  Panic attacks   Pt is currently having extreme anxiety as a result of her work climate She had been off medications for her underlying anxiety and depression and restarted them about 2 weeks ago Discussed will need time to work She was not taking lexapro and encouraged to start this with wellbutrin She did well on this combination before Added trazodone for sleep Added hydroxyzine for as needed anxiety attacks I do think counseling could help and placed referral today Written out of work for 3 weeks for a stablization period and to adjust medications Follow up in 3 weeks If not improving will need to consider [facility] referral.

## 2021-03-26 ENCOUNTER — Other Ambulatory Visit: Payer: Self-pay

## 2021-03-26 ENCOUNTER — Ambulatory Visit (INDEPENDENT_AMBULATORY_CARE_PROVIDER_SITE_OTHER): Payer: No Typology Code available for payment source | Admitting: Professional

## 2021-03-26 ENCOUNTER — Encounter: Payer: Self-pay | Admitting: Professional

## 2021-03-26 ENCOUNTER — Telehealth (HOSPITAL_COMMUNITY): Payer: Self-pay | Admitting: Psychiatry

## 2021-03-26 DIAGNOSIS — F4323 Adjustment disorder with mixed anxiety and depressed mood: Secondary | ICD-10-CM | POA: Insufficient documentation
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
## 2021-03-26 NOTE — Telephone Encounter (Signed)
D:  Teofilo Pod, [facility] referred pt to MH-IOP.  A:  Placed call to orient pt.  Pt states she will call the case mgr back once she calls and verifies her insurance.  Inform Olegario Messier.

## 2021-03-26 NOTE — Progress Notes (Signed)
° ° ° ° ° ° ° ° ° ° ° ° ° ° °  Mckinna Demars, LCMHC °

## 2021-03-26 NOTE — Addendum Note (Signed)
Addended by: Irven Coe on: 03/26/2021 02:46 PM   Modules accepted: Level of Service

## 2021-04-01 ENCOUNTER — Encounter: Payer: Self-pay | Admitting: Physician Assistant

## 2021-04-01 ENCOUNTER — Telehealth: Payer: Self-pay | Admitting: Neurology

## 2021-04-01 ENCOUNTER — Other Ambulatory Visit (HOSPITAL_BASED_OUTPATIENT_CLINIC_OR_DEPARTMENT_OTHER): Payer: Self-pay

## 2021-04-01 ENCOUNTER — Ambulatory Visit (INDEPENDENT_AMBULATORY_CARE_PROVIDER_SITE_OTHER): Payer: No Typology Code available for payment source | Admitting: Physician Assistant

## 2021-04-01 VITALS — BP 152/68 | HR 92 | Ht 65.0 in | Wt 266.0 lb

## 2021-04-01 DIAGNOSIS — Z566 Other physical and mental strain related to work: Secondary | ICD-10-CM

## 2021-04-01 DIAGNOSIS — F410 Panic disorder [episodic paroxysmal anxiety] without agoraphobia: Secondary | ICD-10-CM

## 2021-04-01 DIAGNOSIS — E785 Hyperlipidemia, unspecified: Secondary | ICD-10-CM

## 2021-04-01 DIAGNOSIS — Z79899 Other long term (current) drug therapy: Secondary | ICD-10-CM

## 2021-04-01 DIAGNOSIS — T7840XA Allergy, unspecified, initial encounter: Secondary | ICD-10-CM

## 2021-04-01 DIAGNOSIS — F430 Acute stress reaction: Secondary | ICD-10-CM

## 2021-04-01 LAB — CBC WITH DIFFERENTIAL/PLATELET
Absolute Monocytes: 493 cells/uL (ref 200–950)
Basophils Absolute: 38 cells/uL (ref 0–200)
Basophils Relative: 0.6 %
Eosinophils Absolute: 230 cells/uL (ref 15–500)
Eosinophils Relative: 3.6 %
HCT: 37.4 % (ref 35.0–45.0)
Hemoglobin: 10.8 g/dL — ABNORMAL LOW (ref 11.7–15.5)
Lymphs Abs: 1875 cells/uL (ref 850–3900)
MCH: 20.2 pg — ABNORMAL LOW (ref 27.0–33.0)
MCHC: 28.9 g/dL — ABNORMAL LOW (ref 32.0–36.0)
MCV: 69.9 fL — ABNORMAL LOW (ref 80.0–100.0)
MPV: 9.9 fL (ref 7.5–12.5)
Monocytes Relative: 7.7 %
Neutro Abs: 3763 cells/uL (ref 1500–7800)
Neutrophils Relative %: 58.8 %
Platelets: 443 10*3/uL — ABNORMAL HIGH (ref 140–400)
RBC: 5.35 10*6/uL — ABNORMAL HIGH (ref 3.80–5.10)
RDW: 16.5 % — ABNORMAL HIGH (ref 11.0–15.0)
Total Lymphocyte: 29.3 %
WBC: 6.4 10*3/uL (ref 3.8–10.8)

## 2021-04-01 LAB — COMPLETE METABOLIC PANEL WITH GFR
AG Ratio: 1.4 (calc) (ref 1.0–2.5)
ALT: 27 U/L (ref 6–29)
AST: 23 U/L (ref 10–35)
Albumin: 4.2 g/dL (ref 3.6–5.1)
Alkaline phosphatase (APISO): 68 U/L (ref 31–125)
BUN: 14 mg/dL (ref 7–25)
CO2: 29 mmol/L (ref 20–32)
Calcium: 9.3 mg/dL (ref 8.6–10.2)
Chloride: 104 mmol/L (ref 98–110)
Creat: 0.7 mg/dL (ref 0.50–0.99)
Globulin: 3.1 g/dL (calc) (ref 1.9–3.7)
Glucose, Bld: 118 mg/dL — ABNORMAL HIGH (ref 65–99)
Potassium: 4.3 mmol/L (ref 3.5–5.3)
Sodium: 141 mmol/L (ref 135–146)
Total Bilirubin: 0.5 mg/dL (ref 0.2–1.2)
Total Protein: 7.3 g/dL (ref 6.1–8.1)
eGFR: 108 mL/min/{1.73_m2} (ref >=60)

## 2021-04-01 LAB — CBC MORPHOLOGY

## 2021-04-01 LAB — LIPID PANEL W/REFLEX DIRECT LDL
Cholesterol: 167 mg/dL (ref <200)
HDL: 46 mg/dL — ABNORMAL LOW (ref >=50)
LDL Cholesterol (Calc): 100 mg/dL (calc) — ABNORMAL HIGH
Non-HDL Cholesterol (Calc): 121 mg/dL (calc) (ref <130)
Total CHOL/HDL Ratio: 3.6 (calc) (ref <5.0)
Triglycerides: 110 mg/dL (ref <150)

## 2021-04-01 LAB — TSH: TSH: 3.29 mIU/L

## 2021-04-01 MED ORDER — SERTRALINE HCL 50 MG PO TABS
50.0000 mg | ORAL_TABLET | Freq: Every day | ORAL | 1 refills | Status: DC
  Filled 2021-04-01: qty 30, 30d supply, fill #0

## 2021-04-01 NOTE — Telephone Encounter (Signed)
Updated FMLA forms completed and faxed to [employer] at [phone] with confirmation received.

## 2021-04-01 NOTE — Progress Notes (Signed)
Subjective:    Patient ID: Theresa Beard, female    DOB: Jun 10, 1975, 46 y.o.   MRN: [id number]  HPI Pt is a 46 yo female who presents to the clinic to follow up on acute stress, anxiety and panic attacks. She does feel some better. She has been out of work for the last 2 weeks and no major stressors. She did start counseling with Theresa Beard last visit on 1/19 and she does feel like this is helping. She did have lip and face swelling after taking lexapro 2 days in a row. She has been on this medication before. She did feel like it was helping but stopped it. She is taking hydroxyzine has needed and it does "take the edge off". Anxiety is worse first thing in the morning. No SI/HC. She does not feel like she can go back to work without being on a daily medication for mood.   .. 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   Cervical degenerative disc disease 11/05/2011   Gastroesophageal reflux disease without esophagitis 01/11/2012   Chondromalacia of left patellofemoral joint 03/27/2012   Anxiety state 11/05/2013   Microcytic anemia 11/07/2013   Vitamin D insufficiency 11/07/2013   Trouble in sleeping 01/27/2015   Diabetes mellitus (HCC) 01/27/2015   Class 3 severe obesity due to excess calories with serious comorbidity and body mass index (BMI) of 45.0 to 49.9 in adult (HCC) 07/11/2015   Stress at work 04/06/2017   Irritable 04/06/2017   Acquired skin tag 09/01/2017   Tendinitis of left triceps 09/02/2017   Plantar fasciitis of left foot 09/02/2017   Elevated fasting glucose 05/14/2019   Medication side effect, initial encounter 11/21/2019   Hyperlipidemia LDL goal <70 05/02/2020   Iron deficiency anemia secondary to inadequate dietary iron intake 05/02/2020   Panic attacks 03/16/2021   Acute reaction to stress 03/16/2021   Adjustment disorder with mixed anxiety and depressed mood 03/26/2021    Allergic reaction 04/03/2021   Resolved Ambulatory Problems    Diagnosis Date Noted   CHOLECYSTITIS - ACUTE 12/11/2009   ABDOMINAL PAIN, ACUTE 12/11/2009   Peroneal tendinitis of left lower extremity 11/23/2011   Obesity 11/23/2011   Abnormality of gait 12/14/2011   Influenza-like illness 04/21/2012   Subacromial bursitis 03/16/2013   Skin lesion 04/10/2013   Pre-diabetes 04/06/2015   Pain of left thumb 07/28/2015   Medial epicondylitis, left 09/01/2017   No Additional Past Medical History      Review of Systems See HPI>     Objective:   Physical Exam Vitals reviewed.  Constitutional:      Appearance: Normal appearance. She is obese.  HENT:     Head: Normocephalic.     Nose: Nose normal.     Mouth/Throat:     Mouth: Mucous membranes are moist.     Pharynx: No posterior oropharyngeal erythema.     Comments: No lip swelling.  Eyes:     Conjunctiva/sclera: Conjunctivae normal.  Cardiovascular:     Rate and Rhythm: Normal rate and regular rhythm.     Pulses: Normal pulses.     Heart sounds: Normal heart sounds.  Pulmonary:     Effort: Pulmonary effort is normal.  Skin:    Comments: No rash.  Neurological:     General: No focal deficit present.     Mental Status: She is alert and oriented to person, place, and time.  Psychiatric:  Mood and Affect: Mood normal.  .. Depression screen [facility] 2/9 03/03/2021 08/26/2020 08/08/2019 07/07/2018 08/31/2017  Decreased Interest 3 0 0 0 0  Down, Depressed, Hopeless 3 0 0 0 0  PHQ - 2 Score 6 0 0 0 0  Altered sleeping 3 - 1 - 0  Tired, decreased energy 3 - 0 - 1  Change in appetite 2 - 0 - 1  Feeling bad or failure about yourself  1 - 0 - 0  Trouble concentrating 2 - 0 - 0  Moving slowly or fidgety/restless - - 0 - 0  Suicidal thoughts 0 - 0 - 0  PHQ-9 Score 17 - 1 - 2  Difficult doing work/chores Extremely dIfficult - Not difficult at all - Not difficult at all   .Marland Kitchen GAD 7 : Generalized Anxiety Score 03/03/2021 08/08/2019  07/07/2018 08/31/2017  Nervous, Anxious, on Edge 3 1 2 1   Control/stop worrying 3 0 0 0  Worry too much - different things 3 0 0 0  Trouble relaxing 3 1 1  0  Restless 1 0 0 0  Easily annoyed or irritable 1 1 2 1   Afraid - awful might happen 3 0 0 0  Total GAD 7 Score 17 3 5 2   Anxiety Difficulty Extremely difficult Not difficult at all Not difficult at all Somewhat difficult            Assessment & Plan:  . [name] was seen today for allergic reaction.  Diagnoses and all orders for this visit:  Allergic reaction, initial encounter -     PLATELET ESTIMATION -     CBC MORPHOLOGY  Stress at work -     TSH -     Lipid Panel w/reflex Direct LDL -     COMPLETE METABOLIC PANEL WITH GFR -     CBC with Differential/Platelet -     sertraline (ZOLOFT) 50 MG tablet; Take 1 tablet (50 mg total) by mouth daily.  Panic attacks -     TSH -     sertraline (ZOLOFT) 50 MG tablet; Take 1 tablet (50 mg total) by mouth daily.  Hyperlipidemia LDL goal <70 -     Lipid Panel w/reflex Direct LDL  Medication management -     COMPLETE METABOLIC PANEL WITH GFR  Acute reaction to stress -     sertraline (ZOLOFT) 50 MG tablet; Take 1 tablet (50 mg total) by mouth daily.   Pt is doing some better with mood but she can feel her anxiety coming back up being off lexapro. Unclear why she would become allergic all of a sudden. Added to allergy list.  Start zoloft. Wrote out for another 2 weeks to become stable on zoloft.  Needs fasting labs.  Continue with counseling with kathy.  Discussed breathing and other ways to manage anxiety/panic.  Continue hydroxyzine as needed.

## 2021-04-02 ENCOUNTER — Other Ambulatory Visit (HOSPITAL_BASED_OUTPATIENT_CLINIC_OR_DEPARTMENT_OTHER): Payer: Self-pay

## 2021-04-02 ENCOUNTER — Ambulatory Visit (INDEPENDENT_AMBULATORY_CARE_PROVIDER_SITE_OTHER): Payer: No Typology Code available for payment source | Admitting: Professional

## 2021-04-02 ENCOUNTER — Encounter: Payer: Self-pay | Admitting: Professional

## 2021-04-02 ENCOUNTER — Other Ambulatory Visit: Payer: Self-pay

## 2021-04-02 DIAGNOSIS — F4323 Adjustment disorder with mixed anxiety and depressed mood: Secondary | ICD-10-CM | POA: Diagnosis not present
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
## 2021-04-03 ENCOUNTER — Other Ambulatory Visit: Payer: Self-pay | Admitting: Physician Assistant

## 2021-04-03 ENCOUNTER — Other Ambulatory Visit (HOSPITAL_BASED_OUTPATIENT_CLINIC_OR_DEPARTMENT_OTHER): Payer: Self-pay

## 2021-04-03 DIAGNOSIS — T7840XA Allergy, unspecified, initial encounter: Secondary | ICD-10-CM | POA: Insufficient documentation

## 2021-04-03 MED ORDER — ATORVASTATIN CALCIUM 20 MG PO TABS
20.0000 mg | ORAL_TABLET | Freq: Every day | ORAL | 3 refills | Status: DC
  Filled 2021-04-03: qty 90, 90d supply, fill #0

## 2021-04-03 NOTE — Progress Notes (Signed)
TSH stable from 10 months ago.  You are anemic again. How are you taking iron?  Kidney and liver look great.  LDL is not to goal. I would like to increase to 20mg . Is that ok?

## 2021-04-09 ENCOUNTER — Ambulatory Visit (INDEPENDENT_AMBULATORY_CARE_PROVIDER_SITE_OTHER): Payer: No Typology Code available for payment source | Admitting: Professional

## 2021-04-09 ENCOUNTER — Telehealth: Payer: Self-pay

## 2021-04-09 ENCOUNTER — Encounter: Payer: Self-pay | Admitting: Professional

## 2021-04-09 ENCOUNTER — Other Ambulatory Visit: Payer: Self-pay

## 2021-04-09 DIAGNOSIS — F4323 Adjustment disorder with mixed anxiety and depressed mood: Secondary | ICD-10-CM | POA: Diagnosis not present

## 2021-04-09 NOTE — Telephone Encounter (Signed)
This was a mistake, form states 5 weeks so the date should have been 04/20/2021, that is the date on her [place] forms. Forms fixed and faxed back to [employer] with confirmation received. Patient made aware.

## 2021-04-09 NOTE — Telephone Encounter (Signed)
Patient called with concerns of the end date on their FMLA pw. Per patient, she was requesting an additional week. Can the date be changed from 2/6/ to 04/20/21? Patient would like a call back if request can be completed. Thanks in advance.

## 2021-04-09 NOTE — Progress Notes (Signed)
[facility] Counselor/Therapist Progress Note  Patient ID: MAISLYN MICHIE, MRN: [id number],    Date: 04/09/2021  Time Spent: 36 minutes 109-145 pm  Treatment Type: Individual Therapy  Mental Status Exam: Appearance:  Casual     Behavior: Appropriate and Sharing  Motor: Normal  Speech/Language:  Clear and Coherent and Normal Rate  Affect: Full Range  Mood: anxious and sad  Thought process: goal directed  Thought content:   WNL  Sensory/Perceptual disturbances:   WNL  Orientation: oriented to person, place, time/date, and situation  Attention: Good  Concentration: Good  Memory: WNL  Fund of knowledge:  Good  Insight:   Good  Judgment:  Good  Impulse Control: Good   Risk Assessment: Danger to Self:  No Self-injurious Behavior: No Danger to Others: No Duty to Warn:no Physical Aggression / Violence:No  Access to Firearms a concern: No  Gang Involvement:No   Subjective: The patient arrived on time for her in person session.  Issues addressed: 1-feeling better -able to carry on conversation -was able to order through a drive-thru today -noticing improved sleep 2-return to work does not see herself capable of returning to her current position -the thought of returning to work results in feeling like she could vomit -makes her feel like she could gag 3-family -do not understand her position -discussed how to articulate her thoughts and feelings to sister and mother -role played in session -offered for pt to bring family to session if she would like  Problems Addressed  Anxiety, Eating Disorders And Obesity, Unipolar Depression, Vocational Stress Goals 1. Alleviate depressive symptoms and return to previous level of effective functioning. 2. Appropriately grieve the loss in order to normalize mood and to return to previously adaptive level of functioning. 3. Develop healthy thinking patterns and beliefs about self, others, and the world that lead  to the alleviation and help prevent the relapse of depression. 4. Improve satisfaction and comfort surrounding coworker relationships. Objective Identify and replace distorted cognitive messages associated with feelings of job stress. Target Date: 2022-04-01 Frequency: Weekly  Progress: 0 Modality: individual  Related Interventions Probe and clarify the client's emotions surrounding his/her vocational stress. Assess the client's distorted cognitive messages and schema that foster his/her vocational stress; replace these messages with positive cognitions (or assign "Negative Thoughts Trigger Negative Feelings" in the Adult Psychotherapy Homework Planner by Stephannie Li). Confront the client's pattern of catastrophizing situations leading to immobilizing anxiety; replace these messages with realistic thoughts. Objective Develop and verbalize a plan for constructive action to reduce vocational stress. Target Date: 2022-04-01 Frequency: Weekly  Progress: 0 Modality: individual  Related Interventions Assist the client in developing a plan to react positively to his/her vocational situation (or assign "My Vocational Action Plan" in the Adult Psychotherapy Homework Planner by [facility]); process the proactive plan and assist in its implementation. 5. Increase job satisfaction and performance due to implementation of assertiveness and stress management strategies. 6. Increase sense of confidence and competence in dealing with work responsibilities. 7. Learn and implement coping skills that result in a reduction of anxiety and worry, and improved daily functioning. Objective Verbalize an understanding of the role that cognitive biases play in excessive irrational worry and persistent anxiety symptoms. Target Date: 2022-04-01 Frequency: Weekly  Progress: 0 Modality: individual  Related Interventions Discuss examples demonstrating that unrealistic worry typically overestimates the probability of threats and  underestimates or overlooks the client's ability to manage realistic demands (or assign "Past Successful Anxiety Coping" in the Adult Psychotherapy Homework Planner by  Jongsma). Assist the client in analyzing his/her worries by examining potential biases such as the probability of the negative expectation occurring, the real consequences of it occurring, his/her ability to control the outcome, the worst possible outcome, and his/her ability to accept it (see "Analyze the Probability of a Feared Event" in the Adult Psychotherapy Homework Planner by Stephannie Li; Cognitive Therapy of Anxiety Disorders by Laurence Slate). Objective Learn and implement calming skills to reduce overall anxiety and manage anxiety symptoms. Target Date: 2022-04-01 Frequency: Weekly  Progress: 0 Modality: individual  Related Interventions Teach the client calming/relaxation skills (e.g., applied relaxation, progressive muscle relaxation, cue controlled relaxation; mindful breathing; biofeedback) and how to discriminate better between relaxation and tension; teach the client how to apply these skills to his/her daily life (e.g., New Directions in Progressive Muscle Relaxation by Marcelyn Ditty, and Hazlett-Stevens; Treating Generalized Anxiety Disorder by Rygh and Ida Rogue). Objective Learn and implement relapse prevention strategies for managing possible future anxiety symptoms. Target Date: 2022-04-01 Frequency: Weekly  Progress: 0 Modality: individual  Related Interventions Discuss with the client the distinction between a lapse and relapse, associating a lapse with an initial and reversible return of worry, anxiety symptoms, or urges to avoid, and relapse with the decision to continue the fearful and avoidant patterns. Identify and rehearse with the client the management of future situations or circumstances in which lapses could occur. Instruct the client to routinely use new therapeutic skills (e.g., relaxation,  cognitive restructuring, exposure, and problem-solving) in daily life to address emergent worries, anxiety, and avoidant tendencies. Develop a "coping card" on which coping strategies and other important information (e.g., "Breathe deeply and relax," "Challenge unrealistic worries," "Use problem-solving") are written for the client's later use. Objective Learn and implement a strategy to limit the association between various environmental settings and worry, delaying the worry until a designated "worry time." Target Date: 2022-04-01 Frequency: Weekly  Progress: 0 Modality: individual  Related Interventions Explain the rationale for using a worry time as well as how it is to be used; agree upon and implement a worry time with the client. Teach the client how to recognize, stop, and postpone worry to the agreed upon worry time using skills such as thought stopping, relaxation, and redirecting attention (or assign "Making Use of the Thought-Stopping Technique" and/or "Worry Time" in the Adult Psychotherapy Homework Planner by Jongsma to assist skill development); encourage use in daily life; review and reinforce success while providing corrective feedback toward improvement. Objective Utilize a paradoxical intervention technique to reduce the anxiety response. Target Date: 2022-04-01 Frequency: Weekly  Progress: 0 Modality: individual  Related Interventions Develop a paradoxical intervention (see Ordeal Therapy by Rolly Salter) in which the client is encouraged to have the problem (e.g., anxiety) and then schedule that anxiety to occur at specific intervals each day (at a time of day/night when the client would be clearly wanting to do something else) in a specific way and for a defined length of time. Objective Identify the major life conflicts from the past and present that form the basis for present anxiety. Target Date: 2022-04-01 Frequency: Weekly  Progress: 0 Modality: individual  Related  Interventions Assist the client in becoming aware of key unresolved life conflicts and in starting to work toward their resolution. Reinforce the client's insights into the role of his/her past emotional pain and present anxiety. Ask the client to develop and process a list of key past and present life conflicts that continue to cause worry. 8. Pursue employment consistency with a reasonably  hopeful and positive attitude. 9. Recognize, accept, and cope with feelings of depression. Objective Learn and implement relapse prevention skills. Target Date: 2022-04-01 Frequency: Weekly  Progress: 0 Modality: individual  Related Interventions Identify and rehearse with the client the management of future situations or circumstances in which lapses could occur. Build the client's relapse prevention skills by helping him/her identify early warning signs of relapse and rehearsing the use of skills learned during therapy to manage them. Objective Learn and implement behavioral strategies to overcome depression. Target Date: 2022-04-01 Frequency: Weekly  Progress: 0 Modality: individual  Related Interventions Engage the client in "behavioral activation," increasing his/her activity level and contact with sources of reward, while identifying processes that inhibit activation (see Behavioral Activation for Depression by Beverly Gust, Dimidjian, and Herman-Dunn; or assign "Identify and Schedule Pleasant Activities" in the Adult Psychotherapy Homework Planner by [facility]); use behavioral techniques such as instruction, rehearsal, role-playing, role reversal, as needed, to facilitate activity in the client's daily life; reinforce success. Assist the client in developing skills that increase the likelihood of deriving pleasure from behavioral activation (e.g., assertiveness skills, developing an exercise plan, less internal/more external focus, increased social involvement); reinforce success. Objective Verbalize an  accurate understanding of depression. Target Date: 2022-04-01 Frequency: Weekly  Progress: 0 Modality: individual  Related Interventions Consistent with the treatment model, discuss how cognitive, behavioral, interpersonal, and/or other factors (e.g., family history) contribute to depression. Objective Increasingly verbalize hopeful and positive statements regarding self, others, and the future. Target Date: 2022-04-01 Frequency: Weekly  Progress: 0 Modality: individual  10. Reduce overall frequency, intensity, and duration of the anxiety so that daily functioning is not impaired. 11. Resolve the core conflict that is the source of anxiety. 12. Stabilize anxiety level while increasing ability to function on a daily basis. 13. Terminate overeating and implement lifestyle changes that lead to weight loss and improved health. Objective Verbalize an understanding of relapse prevention and the distinction between a lapse and a relapse. Target Date: 2022-04-01 Frequency: Weekly  Progress: 0 Modality: individual  Related Interventions Identify with the client future situations or circumstances in which lapses could occur. Objective Learn and implement skills for managing urges to engage in unhealthy eating or weight loss practices. Target Date: 2022-04-01 Frequency: Weekly  Progress: 0 Modality: individual  Related Interventions Teach the client tailored skills to manage high-risk situations including distraction, positive self-talk, problem-solving, conflict resolution (e.g., empathy, active listening, "I messages," respectful communication, assertiveness without aggression, compromise), or other social/ communication skills; use modeling, role-playing, and behavior rehearsal to work through several current situations.   Diagnosis:Adjustment disorder with mixed anxiety and depressed mood  Plan:  -invite sister to session to discuss her plans for return to employment -meet again on Thursday,  April 16, 2021 at [place], [place], [facility]

## 2021-04-10 ENCOUNTER — Other Ambulatory Visit (HOSPITAL_BASED_OUTPATIENT_CLINIC_OR_DEPARTMENT_OTHER): Payer: Self-pay

## 2021-04-16 ENCOUNTER — Encounter: Payer: Self-pay | Admitting: Professional

## 2021-04-16 ENCOUNTER — Other Ambulatory Visit: Payer: Self-pay

## 2021-04-16 ENCOUNTER — Ambulatory Visit (INDEPENDENT_AMBULATORY_CARE_PROVIDER_SITE_OTHER): Payer: No Typology Code available for payment source | Admitting: Professional

## 2021-04-16 DIAGNOSIS — F4323 Adjustment disorder with mixed anxiety and depressed mood: Secondary | ICD-10-CM | POA: Diagnosis not present

## 2021-04-16 DIAGNOSIS — F410 Panic disorder [episodic paroxysmal anxiety] without agoraphobia: Secondary | ICD-10-CM | POA: Diagnosis not present

## 2021-04-16 NOTE — Progress Notes (Signed)
[facility] Counselor/Therapist Progress Note  Patient ID: KHILA YELLOCK, MRN: [id number],    Date: 04/16/2021  Time Spent: 73 minutes 1-214 pm  Treatment Type: Individual Therapy  Mental Status Exam: Appearance:  Casual     Behavior: Appropriate and Sharing  Motor: Normal  Speech/Language:  Clear and Coherent and Normal Rate  Affect: Full Range  Mood: anxious and sad  Thought process: goal directed  Thought content:   WNL  Sensory/Perceptual disturbances:   WNL  Orientation: oriented to person, place, time/date, and situation  Attention: Good   Concentration: Good  Memory: Impaired, difficulty with answering questions  Fund of knowledge:  Good  Insight:   Good  Judgment:  Good  Impulse Control: Good   Risk Assessment: Danger to Self:  No Self-injurious Behavior: No Danger to Others: No Duty to Warn:no Physical Aggression / Violence:No  Access to Firearms a concern: No  Gang Involvement:No   Subjective: The patient arrived on time for her in person session accompanied by her sister 30.  Issues addressed: 1-return to work -patient visibly shaking when mentioning her inability to return to work -patient tearful when trying to talk about her return -patient struggled to identify what she needs and asked for help -patient reports she is having panic attacks though able to do some things at home   -reports shortness of breath   -feelings of nausea and that she could regurgitate -patient is able to drive the car again, though did not drive to this appointment -patient still staying at sister's home for support -patient fearful of disappointing team by not returning -patient fearful of being made to present at a training   -had previously been told by her supervisor that she cannot cry through a training   -patient does not think she can successfully complete a training -patient disappointed that her supervisor did not listen to her concerns  related to need for training   -she is upset that she made her train and without herself having ever been trained   -she does not trust her supervisor   -she does not believe she has a voice since her supervisor has "shushed" her when she tried to speak -patient does not appear to be emotionally stabile enough to return to work at this time 2-feedback from her sister -disappointed that job is not working out since it is the ideal job for the patient -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 and having been through [employer] -sister acknowledges the struggles she has had in her current job   -she is not herself   -does okay when watching zombie movies but still has panic attacks when doing that -believes wonders if any of her symptoms are menopause related and would like her tested 3-Clinician reviewed potential options -patient was unable to identify options -options include:   -visit with Iran Planas before her return to work date of Monday, February 13th   -medication evaluation at [employer]; sister to assist patient in securing appointment   -continue outpatient treatment weekly   -consider participation in [place]   -evaluate best next steps regarding employment     -reviewed options: off on leave until patient's [employer] is improved                                    make no decisions related to employment while impaired  stay in current job                                    upon improvement try to return (to work in same job or to give two week notice)                                    sub-pool                                    leave [place]  Problems Addressed  Anxiety, Eating Disorders And Obesity, Unipolar Depression, Vocational Stress Goals 1. Alleviate depressive symptoms and return to previous level of effective functioning. 2. Appropriately grieve the loss in order to normalize mood and to return to previously adaptive  level of functioning. 3. Develop healthy thinking patterns and beliefs about self, others, and the world that lead to the alleviation and help prevent the relapse of depression. 4. Improve satisfaction and comfort surrounding coworker relationships. Objective Identify and replace distorted cognitive messages associated with feelings of job stress. Target Date: 2022-04-01 Frequency: Weekly  Progress: 0 Modality: individual  Related Interventions Probe and clarify the client's emotions surrounding his/her vocational stress. Assess the client's distorted cognitive messages and schema that foster his/her vocational stress; replace these messages with positive cognitions (or assign "Negative Thoughts Trigger Negative Feelings" in the Adult Psychotherapy Homework Planner by Bryn Gulling). Confront the client's pattern of catastrophizing situations leading to immobilizing anxiety; replace these messages with realistic thoughts. Objective Develop and verbalize a plan for constructive action to reduce vocational stress. Target Date: 2022-04-01 Frequency: Weekly  Progress: 0 Modality: individual  Related Interventions Assist the client in developing a plan to react positively to his/her vocational situation (or assign "My Vocational Action Plan" in the Adult Psychotherapy Homework Planner by [facility]); process the proactive plan and assist in its implementation. 5. Increase job satisfaction and performance due to implementation of assertiveness and stress management strategies. 6. Increase sense of confidence and competence in dealing with work responsibilities. 7. Learn and implement coping skills that result in a reduction of anxiety and worry, and improved daily functioning. Objective Verbalize an understanding of the role that cognitive biases play in excessive irrational worry and persistent anxiety symptoms. Target Date: 2022-04-01 Frequency: Weekly  Progress: 0 Modality: individual  Related  Interventions Discuss examples demonstrating that unrealistic worry typically overestimates the probability of threats and underestimates or overlooks the client's ability to manage realistic demands (or assign "Past Successful Anxiety Coping" in the Adult Psychotherapy Homework Planner by [facility]). Assist the client in analyzing his/her worries by examining potential biases such as the probability of the negative expectation occurring, the real consequences of it occurring, his/her ability to control the outcome, the worst possible outcome, and his/her ability to accept it (see "Analyze the Probability of a Feared Event" in the Adult Psychotherapy Homework Planner by Bryn Gulling; Cognitive Therapy of Anxiety Disorders by Alison Stalling). Objective Learn and implement calming skills to reduce overall anxiety and manage anxiety symptoms. Target Date: 2022-04-01 Frequency: Weekly  Progress: 0 Modality: individual  Related Interventions Teach the client calming/relaxation skills (e.g., applied relaxation, progressive muscle relaxation, cue controlled relaxation; mindful breathing; biofeedback) and how to discriminate better between relaxation and tension; teach the client how to apply  these skills to his/her daily life (e.g., New Directions in Progressive Muscle Relaxation by Casper Harrison, and Hazlett-Stevens; Treating Generalized Anxiety Disorder by Rygh and Amparo Bristol). Objective Learn and implement relapse prevention strategies for managing possible future anxiety symptoms. Target Date: 2022-04-01 Frequency: Weekly  Progress: 0 Modality: individual  Related Interventions Discuss with the client the distinction between a lapse and relapse, associating a lapse with an initial and reversible return of worry, anxiety symptoms, or urges to avoid, and relapse with the decision to continue the fearful and avoidant patterns. Identify and rehearse with the client the management of future situations or  circumstances in which lapses could occur. Instruct the client to routinely use new therapeutic skills (e.g., relaxation, cognitive restructuring, exposure, and problem-solving) in daily life to address emergent worries, anxiety, and avoidant tendencies. Develop a "coping card" on which coping strategies and other important information (e.g., "Breathe deeply and relax," "Challenge unrealistic worries," "Use problem-solving") are written for the client's later use. Objective Learn and implement a strategy to limit the association between various environmental settings and worry, delaying the worry until a designated "worry time." Target Date: 2022-04-01 Frequency: Weekly  Progress: 0 Modality: individual  Related Interventions Explain the rationale for using a worry time as well as how it is to be used; agree upon and implement a worry time with the client. Teach the client how to recognize, stop, and postpone worry to the agreed upon worry time using skills such as thought stopping, relaxation, and redirecting attention (or assign "Making Use of the Thought-Stopping Technique" and/or "Worry Time" in the Adult Psychotherapy Homework Planner by Jongsma to assist skill development); encourage use in daily life; review and reinforce success while providing corrective feedback toward improvement. Objective Utilize a paradoxical intervention technique to reduce the anxiety response. Target Date: 2022-04-01 Frequency: Weekly  Progress: 0 Modality: individual  Related Interventions Develop a paradoxical intervention (see Ordeal Therapy by Hildred Alamin) in which the client is encouraged to have the problem (e.g., anxiety) and then schedule that anxiety to occur at specific intervals each day (at a time of day/night when the client would be clearly wanting to do something else) in a specific way and for a defined length of time. Objective Identify the major life conflicts from the past and present that form the  basis for present anxiety. Target Date: 2022-04-01 Frequency: Weekly  Progress: 0 Modality: individual  Related Interventions Assist the client in becoming aware of key unresolved life conflicts and in starting to work toward their resolution. Reinforce the client's insights into the role of his/her past emotional pain and present anxiety. Ask the client to develop and process a list of key past and present life conflicts that continue to cause worry. 8. Pursue employment consistency with a reasonably hopeful and positive attitude. 9. Recognize, accept, and cope with feelings of depression. Objective Learn and implement relapse prevention skills. Target Date: 2022-04-01 Frequency: Weekly  Progress: 0 Modality: individual  Related Interventions Identify and rehearse with the client the management of future situations or circumstances in which lapses could occur. Build the client's relapse prevention skills by helping him/her identify early warning signs of relapse and rehearsing the use of skills learned during therapy to manage them. Objective Learn and implement behavioral strategies to overcome depression. Target Date: 2022-04-01 Frequency: Weekly  Progress: 0 Modality: individual  Related Interventions Engage the client in "behavioral activation," increasing his/her activity level and contact with sources of reward, while identifying processes that inhibit activation (see Behavioral Activation for Depression  by Beverly Gust, Dimidjian, and Herman-Dunn; or assign "Identify and Schedule Pleasant Activities" in the Adult Psychotherapy Homework Planner by [facility]); use behavioral techniques such as instruction, rehearsal, role-playing, role reversal, as needed, to facilitate activity in the client's daily life; reinforce success. Assist the client in developing skills that increase the likelihood of deriving pleasure from behavioral activation (e.g., assertiveness skills, developing an exercise plan,  less internal/more external focus, increased social involvement); reinforce success. Objective Verbalize an accurate understanding of depression. Target Date: 2022-04-01 Frequency: Weekly  Progress: 0 Modality: individual  Related Interventions Consistent with the treatment model, discuss how cognitive, behavioral, interpersonal, and/or other factors (e.g., family history) contribute to depression. Objective Increasingly verbalize hopeful and positive statements regarding self, others, and the future. Target Date: 2022-04-01 Frequency: Weekly  Progress: 0 Modality: individual  10. Reduce overall frequency, intensity, and duration of the anxiety so that daily functioning is not impaired. 11. Resolve the core conflict that is the source of anxiety. 12. Stabilize anxiety level while increasing ability to function on a daily basis. 13. Terminate overeating and implement lifestyle changes that lead to weight loss and improved health. Objective Verbalize an understanding of relapse prevention and the distinction between a lapse and a relapse. Target Date: 2022-04-01 Frequency: Weekly  Progress: 0 Modality: individual  Related Interventions Identify with the client future situations or circumstances in which lapses could occur. Objective Learn and implement skills for managing urges to engage in unhealthy eating or weight loss practices. Target Date: 2022-04-01 Frequency: Weekly  Progress: 0 Modality: individual  Related Interventions Teach the client tailored skills to manage high-risk situations including distraction, positive self-talk, problem-solving, conflict resolution (e.g., empathy, active listening, "I messages," respectful communication, assertiveness without aggression, compromise), or other social/ communication skills; use modeling, role-playing, and behavior rehearsal to work through several current situations.   Diagnosis:Adjustment disorder with mixed anxiety and depressed  mood  Panic attacks  Plan:  -appointment with [place] Planas, [facility], at 1130 am on Friday, April 17, 2021 (arrive at 1120 am)   -bring sister to appointment if support is needed   -discuss need for extended time off due to panic attacks related to return to work -patient and sister to contact [facility] for medication evaluation appointment -sister to assist patient in identifying next steps in employment -meet with Clinician again on Monday, April 20, 2021 at [facility]

## 2021-04-17 ENCOUNTER — Encounter: Payer: Self-pay | Admitting: Physician Assistant

## 2021-04-17 ENCOUNTER — Ambulatory Visit (INDEPENDENT_AMBULATORY_CARE_PROVIDER_SITE_OTHER): Payer: No Typology Code available for payment source | Admitting: Physician Assistant

## 2021-04-17 ENCOUNTER — Encounter: Payer: No Typology Code available for payment source | Admitting: Physician Assistant

## 2021-04-17 ENCOUNTER — Other Ambulatory Visit (HOSPITAL_BASED_OUTPATIENT_CLINIC_OR_DEPARTMENT_OTHER): Payer: Self-pay

## 2021-04-17 VITALS — BP 142/79 | HR 75 | Ht 65.0 in | Wt 265.0 lb

## 2021-04-17 DIAGNOSIS — F4323 Adjustment disorder with mixed anxiety and depressed mood: Secondary | ICD-10-CM

## 2021-04-17 MED ORDER — SERTRALINE HCL 100 MG PO TABS
100.0000 mg | ORAL_TABLET | Freq: Every day | ORAL | 1 refills | Status: DC
  Filled 2021-04-17: qty 30, 30d supply, fill #0

## 2021-04-17 NOTE — Progress Notes (Signed)
Subjective:    Patient ID: Theresa Beard, female    DOB: [birth year], 46 y.o.   MRN: [id number]  HPI Pt is a 46 yo female who presents to the clinic to follow up on adjustment disorder with anxiety and depression. She continues to struggle with "internal shaking". She is supposed to go back to work on Monday but she starts panicing just to think about this. Counselor agrees that she is not ready. Zoloft seems to have made some improvement and she has tolerated the medications. She remains tearful, down, anxious but she is able to do more things. She went out to [place] with a friend last week.  .. 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   Cervical degenerative disc disease 11/05/2011   Gastroesophageal reflux disease without esophagitis 01/11/2012   Chondromalacia of left patellofemoral joint 03/27/2012   Anxiety state 11/05/2013   Microcytic anemia 11/07/2013   Vitamin D insufficiency 11/07/2013   Trouble in sleeping 01/27/2015   Diabetes mellitus (HCC) 01/27/2015   Class 3 severe obesity due to excess calories with serious comorbidity and body mass index (BMI) of 45.0 to 49.9 in adult (HCC) 07/11/2015   Stress at work 04/06/2017   Irritable 04/06/2017   Acquired skin tag 09/01/2017   Tendinitis of left triceps 09/02/2017   Plantar fasciitis of left foot 09/02/2017   Elevated fasting glucose 05/14/2019   Medication side effect, initial encounter 11/21/2019   Hyperlipidemia LDL goal <70 05/02/2020   Iron deficiency anemia secondary to inadequate dietary iron intake 05/02/2020   Panic attacks 03/16/2021   Acute reaction to stress 03/16/2021   Adjustment disorder with mixed anxiety and depressed mood 03/26/2021   Allergic reaction 04/03/2021   Resolved Ambulatory Problems    Diagnosis Date Noted   CHOLECYSTITIS - ACUTE 12/11/2009   ABDOMINAL PAIN, ACUTE 12/11/2009   Peroneal tendinitis  of left lower extremity 11/23/2011   Obesity 11/23/2011   Abnormality of gait 12/14/2011   Influenza-like illness 04/21/2012   Subacromial bursitis 03/16/2013   Skin lesion 04/10/2013   Pre-diabetes 04/06/2015   Pain of left thumb 07/28/2015   Medial epicondylitis, left 09/01/2017   No Additional Past Medical History      Review of Systems See HPI.     Objective:   Physical Exam Tearful  Anxious   .Marland Kitchen Depression screen [facility] 2/9 04/17/2021 03/03/2021 08/26/2020 08/08/2019 07/07/2018  Decreased Interest 3 3 0 0 0  Down, Depressed, Hopeless 3 3 0 0 0  PHQ - 2 Score 6 6 0 0 0  Altered sleeping 0 3 - 1 -  Tired, decreased energy 3 3 - 0 -  Change in appetite 1 2 - 0 -  Feeling bad or failure about yourself  3 1 - 0 -  Trouble concentrating 1 2 - 0 -  Moving slowly or fidgety/restless 3 - - 0 -  Suicidal thoughts 0 0 - 0 -  PHQ-9 Score 17 17 - 1 -  Difficult doing work/chores Extremely dIfficult Extremely dIfficult - Not difficult at all -   .. GAD 7 : Generalized Anxiety Score 04/17/2021 03/03/2021 08/08/2019 07/07/2018  Nervous, Anxious, on Edge 3 3 1 2   Control/stop worrying 3 3 0 0  Worry too much - different things 3 3 0 0  Trouble relaxing 3 3 1 1   Restless 1 1 0 0  Easily annoyed or irritable 0 1 1 2   Afraid -  awful might happen 1 3 0 0  Total GAD 7 Score 14 17 3 5   Anxiety Difficulty Extremely difficult Extremely difficult Not difficult at all Not difficult at all        Assessment & Plan:  . Orean was seen today for follow-up.  Diagnoses and all orders for this visit:  Adjustment disorder with mixed anxiety and depressed mood -     sertraline (ZOLOFT) 100 MG tablet; Take 1 tablet (100 mg total) by mouth daily. -     Ambulatory referral to Psychiatry   PHQ and GAD stable but improved a little.  Increased zoloft to 100mg  Referred to [facility]. Pt is still not stable enough to go back to work until seen by [facility].  Continue with counseling.

## 2021-04-17 NOTE — Patient Instructions (Addendum)
Increased zoloft 100mg  daily.  Will make referral to [facility] .  Will right you out until you see [facility].

## 2021-04-20 ENCOUNTER — Ambulatory Visit (INDEPENDENT_AMBULATORY_CARE_PROVIDER_SITE_OTHER): Payer: No Typology Code available for payment source | Admitting: Professional

## 2021-04-20 ENCOUNTER — Encounter: Payer: Self-pay | Admitting: Professional

## 2021-04-20 DIAGNOSIS — F4323 Adjustment disorder with mixed anxiety and depressed mood: Secondary | ICD-10-CM

## 2021-04-20 DIAGNOSIS — F410 Panic disorder [episodic paroxysmal anxiety] without agoraphobia: Secondary | ICD-10-CM | POA: Diagnosis not present

## 2021-04-20 NOTE — Progress Notes (Signed)
[facility] Counselor/Therapist Progress Note  Patient ID: Theresa Beard, MRN: [id number],    Date: 04/20/2021  Time Spent: 52 minutes [id number] pm  Treatment Type: Individual Therapy  Mental Status Exam: Appearance:  Casual     Behavior: Appropriate and Sharing  Motor: Normal  Speech/Language:  Clear and Coherent and Normal Rate  Affect: Full Range  Mood: anxious and sad  Thought process: goal directed  Thought content:   WNL  Sensory/Perceptual disturbances:   WNL  Orientation: oriented to person, place, time/date, and situation  Attention: Good   Concentration: Good  Memory: Impaired, difficulty with answering questions  Fund of knowledge:  Good  Insight:   Good  Judgment:  Good  Impulse Control: Good   Risk Assessment: Danger to Self:  No Self-injurious Behavior: No Danger to Others: No Duty to Warn:no Physical Aggression / Violence:No  Access to Firearms a concern: No  Gang Involvement:No   Subjective: The patient arrived late for her visession accompanied by her sister 32.  Issues addressed: 1-social -was able to go out with bff Theresa Beard -was able to remain in restaurant environment -was able to talk freely with bff and other friends that were parent 2-professional -noticed the anxiety beginning within the first month   -she knew within the fourth week she started to notice boss was different     -she would snap about little things     -she would take things and twist it so she had to watch everything she said   -nausea, not wanting to go to work   -things went downhill   -"I would tread softly because with Theresa Beard you never know how she was going to be"   -right before [place] said when they get back from holiday     -pt thought about the entire time she was off     -pt followed up after break and she said she didn't have the time and wasn't going to -pt is unsure if she can ever work with her again   -she does not know if  she can be in that environment   -she feels like she would be walking on pins and needles 3-emotional health -it's been a year and a half since she was healthy   -good relationships   -doesn't dread going to work 4-coping -being away from job -reading, being with family -emotional eating -when her dog was a live she would take walks -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 Alleviate depressive symptoms and return to previous level of effective functioning. 2. Appropriately grieve the loss in order to normalize mood and to return to previously adaptive level of functioning. 3. Develop healthy thinking patterns and beliefs about self, others, and the world that lead to the alleviation and help prevent the relapse of depression. 4. Improve satisfaction and comfort surrounding coworker relationships. Objective Identify and replace distorted cognitive messages associated with feelings of job stress. Target Date: 2022-04-01 Frequency: Weekly  Progress: 0 Modality: individual  Related Interventions Probe and clarify the client's emotions surrounding his/her vocational stress. Assess the client's distorted cognitive messages and schema that foster  his/her vocational stress; replace these messages with positive cognitions (or assign "Negative Thoughts Trigger Negative Feelings" in the Adult Psychotherapy Homework Planner by Theresa Beard). Confront the client's pattern of catastrophizing situations leading to immobilizing anxiety; replace these messages with realistic thoughts. Objective Develop and verbalize a plan for constructive action to reduce vocational  stress. Target Date: 2022-04-01 Frequency: Weekly  Progress: 0 Modality: individual  Related Interventions Assist the client in developing a plan to react positively to his/her vocational situation (or assign "My Vocational Action Plan" in the Adult Psychotherapy Homework Planner by Theresa Beard); process the proactive plan and assist in its implementation. 5. Increase job satisfaction and performance due to implementation of assertiveness and stress management strategies. 6. Increase sense of confidence and competence in dealing with work responsibilities. 7. Learn and implement coping skills that result in a reduction of anxiety and worry, and improved daily functioning. Objective Verbalize an understanding of the role that cognitive biases play in excessive irrational worry and persistent anxiety symptoms. Target Date: 2022-04-01 Frequency: Weekly  Progress: 0 Modality: individual  Related Interventions Discuss examples demonstrating that unrealistic worry typically overestimates the probability of threats and underestimates or overlooks the client's ability to manage realistic demands (or assign "Past Successful Anxiety Coping" in the Adult Psychotherapy Homework Planner by Theresa Beard). Assist the client in analyzing his/her worries by examining potential biases such as the probability of the negative expectation occurring, the real consequences of it occurring, his/her ability to control the outcome, the worst possible outcome, and his/her ability to accept it (see "Analyze the Probability of a Feared Event" in the Adult Psychotherapy Homework Planner by Theresa Beard; Cognitive Therapy of Anxiety Disorders by Theresa Beard). Objective Learn and implement calming skills to reduce overall anxiety and manage anxiety symptoms. Target Date: 2022-04-01 Frequency: Weekly  Progress: 0 Modality: individual  Related Interventions Teach the client calming/relaxation skills (e.g., applied relaxation, progressive  muscle relaxation, cue controlled relaxation; mindful breathing; biofeedback) and how to discriminate better between relaxation and tension; teach the client how to apply these skills to his/her daily life (e.g., New Directions in Progressive Muscle Relaxation by Casper Harrison, and Hazlett-Stevens; Treating Generalized Anxiety Disorder by Rygh and Amparo Bristol). Objective Learn and implement relapse prevention strategies for managing possible future anxiety symptoms. Target Date: 2022-04-01 Frequency: Weekly  Progress: 0 Modality: individual  Related Interventions Discuss with the client the distinction between a lapse and relapse, associating a lapse with an initial and reversible return of worry, anxiety symptoms, or urges to avoid, and relapse with the decision to continue the fearful and avoidant patterns. Identify and rehearse with the client the management of future situations or circumstances in which lapses could occur. Instruct the client to routinely use new therapeutic skills (e.g., relaxation, cognitive restructuring, exposure, and problem-solving) in daily life to address emergent worries, anxiety, and avoidant tendencies. Develop a "coping card" on which coping strategies and other important information (e.g., "Breathe deeply and relax," "Challenge unrealistic worries," "Use problem-solving") are written for the client's later use. Objective Learn and implement a strategy to limit the association between various environmental settings and worry, delaying the worry until a designated "worry time." Target Date: 2022-04-01 Frequency: Weekly  Progress: 0 Modality: individual  Related Interventions Explain the rationale for using a worry time as well as how it is to be used; agree upon and implement a worry time with the client. Teach the client how to recognize, stop, and postpone worry to the agreed upon worry time using skills such as thought stopping, relaxation,  and redirecting  attention (or assign "Making Use of the Thought-Stopping Technique" and/or "Worry Time" in the Adult Psychotherapy Homework Planner by Jongsma to assist skill development); encourage use in daily life; review and reinforce success while providing corrective feedback toward improvement. Objective Utilize a paradoxical intervention technique to reduce the anxiety response. Target Date: 2022-04-01 Frequency: Weekly  Progress: 0 Modality: individual  Related Interventions Develop a paradoxical intervention (see Ordeal Therapy by Hildred Alamin) in which the client is encouraged to have the problem (e.g., anxiety) and then schedule that anxiety to occur at specific intervals each day (at a time of day/night when the client would be clearly wanting to do something else) in a specific way and for a defined length of time. Objective Identify the major life conflicts from the past and present that form the basis for present anxiety. Target Date: 2022-04-01 Frequency: Weekly  Progress: 0 Modality: individual  Related Interventions Assist the client in becoming aware of key unresolved life conflicts and in starting to work toward their resolution. Reinforce the client's insights into the role of his/her past emotional pain and present anxiety. Ask the client to develop and process a list of key past and present life conflicts that continue to cause worry. 8. Pursue employment consistency with a reasonably hopeful and positive attitude. 9. Recognize, accept, and cope with feelings of depression. Objective Learn and implement relapse prevention skills. Target Date: 2022-04-01 Frequency: Weekly  Progress: 0 Modality: individual  Related Interventions Identify and rehearse with the client the management of future situations or circumstances in which lapses could occur. Build the client's relapse prevention skills by helping him/her identify early warning signs of relapse and rehearsing the use of skills learned  during therapy to manage them. Objective Learn and implement behavioral strategies to overcome depression. Target Date: 2022-04-01 Frequency: Weekly  Progress: 0 Modality: individual  Related Interventions Engage the client in "behavioral activation," increasing his/her activity level and contact with sources of reward, while identifying processes that inhibit activation (see Behavioral Activation for Depression by Beverly Gust, Dimidjian, and Herman-Dunn; or assign "Identify and Schedule Pleasant Activities" in the Adult Psychotherapy Homework Planner by [facility] [place]); use behavioral techniques such as instruction, rehearsal, role-playing, role reversal, as needed, to facilitate activity in the client's daily life; reinforce success. Assist the client in developing skills that increase the likelihood of deriving pleasure from behavioral activation (e.g., assertiveness skills, developing an exercise plan, less internal/more external focus, increased social involvement); reinforce success. Objective Verbalize an accurate understanding of depression. Target Date: 2022-04-01 Frequency: Weekly  Progress: 0 Modality: individual  Related Interventions Consistent with the treatment model, discuss how cognitive, behavioral, interpersonal, and/or other factors (e.g., family history) contribute to depression. Objective Increasingly verbalize hopeful and positive statements regarding self, others, and the future. Target Date: 2022-04-01 Frequency: Weekly  Progress: 0 Modality: individual  10. Reduce overall frequency, intensity, and duration of the anxiety so that daily functioning is not impaired. 11. Resolve the core conflict that is the source of anxiety. 12. Stabilize anxiety level while increasing ability to function on a daily basis. 13. Terminate overeating and implement lifestyle changes that lead to weight loss and improved health. Objective Verbalize an understanding of relapse prevention and the  distinction between a lapse and a relapse. Target Date: 2022-04-01 Frequency: Weekly  Progress: 0 Modality: individual  Related Interventions Identify with the client future situations or circumstances in which lapses could occur. Objective Learn and implement skills for managing urges to engage in unhealthy eating or weight loss practices.  Target Date: 2022-04-01 Frequency: Weekly  Progress: 0 Modality: individual  Related Interventions Teach the client tailored skills to manage high-risk situations including distraction, positive self-talk, problem-solving, conflict resolution (e.g., empathy, active listening, "I messages," respectful communication, assertiveness without aggression, compromise), or other social/ communication skills; use modeling, role-playing, and behavior rehearsal to work through several current situations.   Diagnosis:No diagnosis found.  Plan:  -meet again on Monday, April 27, 2021 at [facility], [place]ard

## 2021-04-21 ENCOUNTER — Telehealth: Payer: Self-pay | Admitting: Neurology

## 2021-04-21 NOTE — Telephone Encounter (Signed)
Patient called and left vm stating she contacted [facility] and one more mental health provider and next available appt not available until 3 weeks out. She wants to know what to do about [employer] forms and being out of work. Please advise.

## 2021-04-22 NOTE — Telephone Encounter (Signed)
We can fill them out and just state that is the next appt.

## 2021-04-22 NOTE — Telephone Encounter (Signed)
Faxed paperwork with new dates.

## 2021-04-24 ENCOUNTER — Other Ambulatory Visit (HOSPITAL_BASED_OUTPATIENT_CLINIC_OR_DEPARTMENT_OTHER): Payer: Self-pay

## 2021-04-27 ENCOUNTER — Ambulatory Visit (INDEPENDENT_AMBULATORY_CARE_PROVIDER_SITE_OTHER): Payer: No Typology Code available for payment source | Admitting: Professional

## 2021-04-27 ENCOUNTER — Encounter: Payer: Self-pay | Admitting: Professional

## 2021-04-27 DIAGNOSIS — F4323 Adjustment disorder with mixed anxiety and depressed mood: Secondary | ICD-10-CM

## 2021-04-27 NOTE — Progress Notes (Signed)
[facility] Counselor/Therapist Progress Note  Patient ID: Theresa Beard, MRN: [id number],    Date: 04/27/2021  Time Spent: 47 minutes 805-852 pm  Treatment Type: Individual Therapy  Mental Status Exam: Appearance:  Casual     Behavior: Appropriate and Sharing  Motor: Normal  Speech/Language:  Clear and Coherent and Normal Rate  Affect: Full Range  Mood: calm  Thought process: goal directed  Thought content:   WNL  Sensory/Perceptual disturbances:   WNL  Orientation: oriented to person, place, time/date, and situation  Attention: Good   Concentration: Good  Memory: Good  Fund of knowledge:  Good  Insight:   Good  Judgment:  Good  Impulse Control: Good   Risk Assessment: Danger to Self:  No Self-injurious Behavior: No Danger to Others: No Duty to Warn:no Physical Aggression / Violence:No  Access to Firearms a concern: No  Gang Involvement:No   Subjective: This session was held via video teletherapy due to the coronavirus risk at this time. The patient consented to video teletherapy and was located at her home during this session. She is aware it is the responsibility of the patient to secure confidentiality on her end of the session. The provider was in a private home office for the duration of this session.   The patient arrived on time for her webex appointment appearing casually groomed and easily engaged  Issues addressed: 1-mood -had issues with irritability yesterday -pt feels good that she is able to take her mom places   -she took her to grocery store and out to eat   -still has nausea and stomach upset 2-professional -what would it take to make her happy   -feels lost there -cannot recall the last time she was happy -she struggles to identify when she was last happy   -admits that it has been when she was in [employer] -does not like to manage people -likes   -helping people   -prefers seeing new people instead of the same people over  and over   -want to leave work at work   -wants to work independently and with a team     -does not want to be on top of somebody all the time   -animals -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 -pt wants to figure out her next steps once she has gotten to a healthier place emotionally 3-Psychiatry -[facility] appointment with PA Theresa Beard March 8th -pt has realized she needs a break from healthcare so she can figure out what she needs to do, and wants to do 4-Lapse vs relapse -triggers: I gotta think about that, Theresa Beard -signs/symptoms: physically sick when getting up and driving to work, such a sense of dread, "my sister said I was really angry" How to prevent: I need to set boundaries, I need to say no   -reminded pt importance of setting boundaries she is willing to follow through with negative consequences   -pt set boundaries at work but did not follow through with the consequences -needs to avoid pushing herself where she cannot do it anymore    Problems Addressed  Anxiety, Eating Disorders And Obesity, Unipolar Depression, Vocational Stress Goals 1. Alleviate depressive symptoms and return to previous level of effective functioning. 2. Appropriately grieve the loss in order to normalize mood and to return to previously adaptive level of functioning. 3. Develop healthy thinking patterns and beliefs about self, others, and the world that lead to the alleviation and help prevent the relapse  of depression. 4. Improve satisfaction and comfort surrounding coworker relationships. Objective Identify and replace distorted cognitive messages associated with feelings of job stress. Target Date: 2022-04-01 Frequency: Weekly  Progress: 0 Modality: individual  Related Interventions Probe and clarify the client's emotions surrounding his/her vocational stress. Assess the client's distorted cognitive messages and schema that foster his/her vocational stress; replace these  messages with positive cognitions (or assign "Negative Thoughts Trigger Negative Feelings" in the Adult Psychotherapy Homework Planner by Theresa Beard). Confront the client's pattern of catastrophizing situations leading to immobilizing anxiety; replace these messages with realistic thoughts. Objective Develop and verbalize a plan for constructive action to reduce vocational stress. Target Date: 2022-04-01 Frequency: Weekly  Progress: 0 Modality: individual  Related Interventions Assist the client in developing a plan to react positively to his/her vocational situation (or assign "My Vocational Action Plan" in the Adult Psychotherapy Homework Planner by [facility]); process the proactive plan and assist in its implementation. 5. Increase job satisfaction and performance due to implementation of assertiveness and stress management strategies. 6. Increase sense of confidence and competence in dealing with work responsibilities. 7. Learn and implement coping skills that result in a reduction of anxiety and worry, and improved daily functioning. Objective Verbalize an understanding of the role that cognitive biases play in excessive irrational worry and persistent anxiety symptoms. Target Date: 2022-04-01 Frequency: Weekly  Progress: 0 Modality: individual  Related Interventions Discuss examples demonstrating that unrealistic worry typically overestimates the probability of threats and underestimates or overlooks the client's ability to manage realistic demands (or assign "Past Successful Anxiety Coping" in the Adult Psychotherapy Homework Planner by [facility]). Assist the client in analyzing his/her worries by examining potential biases such as the probability of the negative expectation occurring, the real consequences of it occurring, his/her ability to control the outcome, the worst possible outcome, and his/her ability to accept it (see "Analyze the Probability of a Feared Event" in the Adult Psychotherapy  Homework Planner by Theresa Beard; Cognitive Therapy of Anxiety Disorders by Theresa Beard). Objective Learn and implement calming skills to reduce overall anxiety and manage anxiety symptoms. Target Date: 2022-04-01 Frequency: Weekly  Progress: 0 Modality: individual  Related Interventions Teach the client calming/relaxation skills (e.g., applied relaxation, progressive muscle relaxation, cue controlled relaxation; mindful breathing; biofeedback) and how to discriminate better between relaxation and tension; teach the client how to apply these skills to his/her daily life (e.g., New Directions in Progressive Muscle Relaxation by Casper Harrison, and Hazlett-Stevens; Treating Generalized Anxiety Disorder by Rygh and Amparo Bristol). Objective Learn and implement relapse prevention strategies for managing possible future anxiety symptoms. Target Date: 2022-04-01 Frequency: Weekly  Progress: 0 Modality: individual  Related Interventions Discuss with the client the distinction between a lapse and relapse, associating a lapse with an initial and reversible return of worry, anxiety symptoms, or urges to avoid, and relapse with the decision to continue the fearful and avoidant patterns. Identify and rehearse with the client the management of future situations or circumstances in which lapses could occur. Instruct the client to routinely use new therapeutic skills (e.g., relaxation, cognitive restructuring, exposure, and problem-solving) in daily life to address emergent worries, anxiety, and avoidant tendencies. Develop a "coping card" on which coping strategies and other important information (e.g., "Breathe deeply and relax," "Challenge unrealistic worries," "Use problem-solving") are written for the client's later use. Objective Learn and implement a strategy to limit the association between various environmental settings and worry, delaying the worry until a designated "worry time." Target Date: 2022-04-01  Frequency: Weekly  Progress: 0 Modality: individual  Related Interventions Explain the rationale for using a worry time as well as how it is to be used; agree upon and implement a worry time with the client. Teach the client how to recognize, stop, and postpone worry to the agreed upon worry time using skills such as thought stopping, relaxation, and redirecting attention (or assign "Making Use of the Thought-Stopping Technique" and/or "Worry Time" in the Adult Psychotherapy Homework Planner by Jongsma to assist skill development); encourage use in daily life; review and reinforce success while providing corrective feedback toward improvement. Objective Utilize a paradoxical intervention technique to reduce the anxiety response. Target Date: 2022-04-01 Frequency: Weekly  Progress: 0 Modality: individual  Related Interventions Develop a paradoxical intervention (see Ordeal Therapy by Hildred Alamin) in which the client is encouraged to have the problem (e.g., anxiety) and then schedule that anxiety to occur at specific intervals each day (at a time of day/night when the client would be clearly wanting to do something else) in a specific way and for a defined length of time. Objective Identify the major life conflicts from the past and present that form the basis for present anxiety. Target Date: 2022-04-01 Frequency: Weekly  Progress: 0 Modality: individual  Related Interventions Assist the client in becoming aware of key unresolved life conflicts and in starting to work toward their resolution. Reinforce the client's insights into the role of his/her past emotional pain and present anxiety. Ask the client to develop and process a list of key past and present life conflicts that continue to cause worry. 8. Pursue employment consistency with a reasonably hopeful and positive attitude. 9. Recognize, accept, and cope with feelings of depression. Objective Learn and implement relapse prevention  skills. Target Date: 2022-04-01 Frequency: Weekly  Progress: 0 Modality: individual  Related Interventions Identify and rehearse with the client the management of future situations or circumstances in which lapses could occur. Build the client's relapse prevention skills by helping him/her identify early warning signs of relapse and rehearsing the use of skills learned during therapy to manage them. Objective Learn and implement behavioral strategies to overcome depression. Target Date: 2022-04-01 Frequency: Weekly  Progress: 0 Modality: individual  Related Interventions Engage the client in "behavioral activation," increasing his/her activity level and contact with sources of reward, while identifying processes that inhibit activation (see Behavioral Activation for Depression by Beverly Gust, Dimidjian, and Herman-Dunn; or assign "Identify and Schedule Pleasant Activities" in the Adult Psychotherapy Homework Planner by [facility]); use behavioral techniques such as instruction, rehearsal, role-playing, role reversal, as needed, to facilitate activity in the client's daily life; reinforce success. Assist the client in developing skills that increase the likelihood of deriving pleasure from behavioral activation (e.g., assertiveness skills, developing an exercise plan, less internal/more external focus, increased social involvement); reinforce success. Objective Verbalize an accurate understanding of depression. Target Date: 2022-04-01 Frequency: Weekly  Progress: 0 Modality: individual  Related Interventions Consistent with the treatment model, discuss how cognitive, behavioral, interpersonal, and/or other factors (e.g., family history) contribute to depression. Objective Increasingly verbalize hopeful and positive statements regarding self, others, and the future. Target Date: 2022-04-01 Frequency: Weekly  Progress: 0 Modality: individual  10. Reduce overall frequency, intensity, and duration of the  anxiety so that daily functioning is not impaired. 11. Resolve the core conflict that is the source of anxiety. 12. Stabilize anxiety level while increasing ability to function on a daily basis. 13. Terminate overeating and implement lifestyle changes that lead to weight loss and improved health. Objective Verbalize an  understanding of relapse prevention and the distinction between a lapse and a relapse. Target Date: 2022-04-01 Frequency: Weekly  Progress: 0 Modality: individual  Related Interventions Identify with the client future situations or circumstances in which lapses could occur. Objective Learn and implement skills for managing urges to engage in unhealthy eating or weight loss practices. Target Date: 2022-04-01 Frequency: Weekly  Progress: 0 Modality: individual  Related Interventions Teach the client tailored skills to manage high-risk situations including distraction, positive self-talk, problem-solving, conflict resolution (e.g., empathy, active listening, "I messages," respectful communication, assertiveness without aggression, compromise), or other social/ communication skills; use modeling, role-playing, and behavior rehearsal to work through several current situations.  Diagnosis:Adjustment disorder with mixed anxiety and depressed mood  Plan:  -pt to identify triggers and signs/symptoms she experiences when decompensating. -meet again on Wednesday, May 06, 2021 at [place], [facility]

## 2021-05-06 ENCOUNTER — Ambulatory Visit: Payer: No Typology Code available for payment source | Admitting: Professional

## 2021-05-13 ENCOUNTER — Other Ambulatory Visit: Payer: Self-pay

## 2021-05-13 ENCOUNTER — Encounter: Payer: Self-pay | Admitting: Behavioral Health

## 2021-05-13 ENCOUNTER — Ambulatory Visit (INDEPENDENT_AMBULATORY_CARE_PROVIDER_SITE_OTHER): Payer: No Typology Code available for payment source | Admitting: Behavioral Health

## 2021-05-13 ENCOUNTER — Other Ambulatory Visit (HOSPITAL_BASED_OUTPATIENT_CLINIC_OR_DEPARTMENT_OTHER): Payer: Self-pay

## 2021-05-13 VITALS — BP 131/90 | HR 86 | Ht 65.0 in | Wt 265.0 lb

## 2021-05-13 DIAGNOSIS — F331 Major depressive disorder, recurrent, moderate: Secondary | ICD-10-CM

## 2021-05-13 DIAGNOSIS — F410 Panic disorder [episodic paroxysmal anxiety] without agoraphobia: Secondary | ICD-10-CM | POA: Diagnosis not present

## 2021-05-13 DIAGNOSIS — F411 Generalized anxiety disorder: Secondary | ICD-10-CM | POA: Diagnosis not present

## 2021-05-13 DIAGNOSIS — F4323 Adjustment disorder with mixed anxiety and depressed mood: Secondary | ICD-10-CM

## 2021-05-13 MED ORDER — SERTRALINE HCL 100 MG PO TABS
150.0000 mg | ORAL_TABLET | Freq: Every day | ORAL | 2 refills | Status: DC
  Filled 2021-05-13: qty 45, 30d supply, fill #0

## 2021-05-13 NOTE — Progress Notes (Signed)
[facility] MD/PA/NP Initial Note ? ?05/13/2021 3:43 PM ?Theresa Beard  ?MRN:  [id number] ? ?Chief Complaint:  ?Chief Complaint   ?Anxiety; Depression; Establish Care; Medication Problem; Medication Refill ?  ? ? ?HPI:  ?46 year old female presents to this office for initial visit and to establish care. She tearfully says that she started having some problems with anxiety and depression in 2019 and it become worse during Covid. Says that she was under lot of stress at work due to how patients treated her during that time. She was placed on Lexapro by her PCP but later developed face and lip swelling. In October 2022, she changed jobs and with [employer] thinking it would be better fit and less stressful. She says that she began to have poor working relations with her manager and the stress levels returned. She is crying talking about this but says it became overwhelming and she ended up taking FMLA and two weeks off from work. 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 Says that her anxiety level is 7/10 and depression is 7 of 10 right now. She does sleep 7-8 hours per night. She endorses lack of concentration but she says that it is due to her emotional state right now. She would like to consider adjustment or change to her medication. She denies mania, no psychosis, no SI/HI. ? ?Prior psychiatric medication: ?Lexapro-facial and lip swelling ?Wellbutin-unknown ? ? ? ?Visit Diagnosis:  ?  ICD-10-CM   ?1. Adjustment disorder with mixed anxiety and depressed mood  F43.23 sertraline (ZOLOFT) 100 MG tablet  ?  ?2. Panic attacks  F41.0 sertraline (ZOLOFT) 100 MG tablet  ?  ?3. Generalized anxiety disorder  F41.1 sertraline (ZOLOFT) 100 MG tablet  ?  ?4. Moderate episode of recurrent major depressive disorder (HCC)  F33.1 sertraline (ZOLOFT) 100 MG tablet  ?  ? ? ?Past Psychiatric History: anxiety, depression, adjustment  disorder ? ?Past Medical History: No past medical history on file.  ?Past Surgical History:  ?Procedure Laterality Date  ? CHOLECYSTECTOMY    ? wisdom teeth    ? ? ?Family Psychiatric History: see chart ? ?Family History:  ?Family History  ?Problem Relation Age of Onset  ? Diabetes Mother   ? OCD Maternal Aunt   ? ? ?Social History:  ?Social History  ? ?Socioeconomic History  ? Marital status: Single  ?  Spouse name: Not on file  ? Number of children: Not on file  ? Years of education: 44  ? Highest education level: Bachelor's degree (e.g., BA, AB, BS)  ?Occupational History  ? Occupation: [employer]  ?Tobacco Use  ? Smoking status: Never  ? Smokeless tobacco: Never  ?Substance and Sexual Activity  ? Alcohol use: Yes  ? Drug use: No  ? Sexual activity: Not Currently  ?  Comment: no regular exercise, 2 caffeine drinks daily.  ?Other Topics Concern  ? Not on file  ?Social History Narrative  ? Lives with sister in [place].   ? ?Social Determinants of Health  ? ?Financial Resource Strain: Not on file  ?Food Insecurity: Not on file  ?Transportation Needs: Not on file  ?Physical Activity: Not on file  ?Stress: Not on file  ?Social Connections: Not on file  ? ? ?Allergies:  ?Allergies  ?Allergen Reactions  ? Lexapro [Escitalopram]   ?  Face swelling/lip swelling  ? Metformin And  Related   ?  dizzy  ? Lisinopril   ?  Cough   ? ? ?Metabolic Disorder Labs: ?Lab Results  ?Component Value Date  ? HGBA1C 5.9 (A) 03/03/2021  ? MPG 128 08/31/2017  ? MPG 120 06/29/2016  ? ?No results found for: PROLACTIN ?Lab Results  ?Component Value Date  ? CHOL 167 04/01/2021  ? TRIG 110 04/01/2021  ? HDL 46 (L) 04/01/2021  ? CHOLHDL 3.6 04/01/2021  ? VLDL 22 04/05/2015  ? LDLCALC 100 (H) 04/01/2021  ? LDLCALC 107 (H) 05/09/2020  ? ?Lab Results  ?Component Value Date  ? TSH 3.29 04/01/2021  ? TSH 3.16 05/09/2020  ? ? ?Therapeutic Level Labs: ?No results found for: LITHIUM ?No results found for: VALPROATE ?No components found for:   CBMZ ? ?Current Medications: ?Current Outpatient Medications  ?Medication Sig Dispense Refill  ? sertraline (ZOLOFT) 100 MG tablet Take 1 & 1/2 tablet (150 mg total) by mouth daily. 45 tablet 2  ? atorvastatin (LIPITOR) 20 MG tablet Take 1 tablet (20 mg total) by mouth daily. 90 tablet 3  ? Dulaglutide 1.5 MG/0.5ML SOPN Inject 1.5 mg into the skin once a week. 6 mL 1  ? esomeprazole (NEXIUM) 40 MG capsule Take 1 capsule (40 mg total) by mouth daily. 90 capsule 3  ? hydrOXYzine (ATARAX) 10 MG tablet Take 1 tablet (10 mg total) by mouth 3 (three) times daily as needed for anxiety. 90 tablet 0  ? losartan (COZAAR) 100 MG tablet Take 1 tablet (100 mg total) by mouth daily. 90 tablet 1  ? traZODone (DESYREL) 50 MG tablet Take 1/2 -1 tablet (25-50 mg total) by mouth at bedtime as needed for sleep. 30 tablet 2  ? ?No current facility-administered medications for this visit.  ? ? ?Medication Side Effects: none ? ?Orders placed this visit:  No orders of the defined types were placed in this encounter. ? ? ?Psychiatric Specialty Exam: ? ?Review of Systems  ?Blood pressure 131/90, pulse 86, height 5\' 5"  (1.651 m), weight 265 lb (120.2 kg).Body mass index is 44.1 kg/m?.  ?General Appearance: Casual, Neat, and Well Groomed  ?Eye Contact:  Good  ?Speech:  Clear and Coherent  ?Volume:  Normal  ?Mood:  Anxious and Depressed  ?Affect:  Depressed, Tearful, and Anxious  ?Thought Process:  Coherent  ?Orientation:  Full (Time, Place, and Person)  ?Thought Content: Logical   ?Suicidal Thoughts:  No  ?Homicidal Thoughts:  No  ?Memory:  WNL  ?Judgement:  Good  ?Insight:  Good  ?Psychomotor Activity:  Normal  ?Concentration:  Concentration: Good  ?Recall:  Good  ?Fund of Knowledge: Good  ?Language: Good  ?Assets:  Desire for Improvement  ?ADL's:  Intact  ?Cognition: WNL  ?Prognosis:  Good  ? ?Screenings:  ?GAD-7   ? ?Flowsheet Row Office Visit from 04/17/2021 in [facility] At [facility] Office Visit from 03/03/2021 in  [facility] Office Visit from 08/08/2019 [facility] Office Visit from 07/07/2018 in [facility] Office Visit from 08/31/2017 in [facility]  ?Total GAD-7 Score 14 17 3 5 2   ? ?  ? ?PHQ2-9   ? ?Flowsheet Row Office Visit from 05/13/2021 in [facility] Office Visit from 04/17/2021 in [facility] Office Visit from 03/03/2021 in [facility] At [facility] Office Visit from [facility]/26[facility]2020 [facility]  ?PHQ-2 Total Score 4 6 6  0 0  ?PHQ-9 Total Score 9 17 17  -- 1  ? ?  ? ? ?Receiving Psychotherapy: No  ? ?Treatment Plan/Recommendations:  ?Greater than 50% of face to face time with patient was spent on counseling and coordination of care. We discussed her hx of anxiety and depression over the last couple of years and know triggers primarily work related. ?We discussed her previous tx, reviewed medications and goals for tx here.  ?We agreed to increase Zoloft to 150 mg daily ?To continue Hydroxyzine 10 mg three times daily as needed. ?To continue Trazodone 50 mg at bedtime daily ?Will report worsening symptoms or side effects ?To follow up in 4 weeks to reassess ?[facility]. No BC, not sexually active ?Reviewed PDMP ? ? ? ? ?Joan FloresBrian A Makeisha Jentsch, NP ? ?         ?

## 2021-05-14 ENCOUNTER — Ambulatory Visit (INDEPENDENT_AMBULATORY_CARE_PROVIDER_SITE_OTHER): Payer: No Typology Code available for payment source | Admitting: Professional

## 2021-05-14 ENCOUNTER — Encounter: Payer: Self-pay | Admitting: Professional

## 2021-05-14 DIAGNOSIS — F331 Major depressive disorder, recurrent, moderate: Secondary | ICD-10-CM | POA: Diagnosis not present

## 2021-05-14 DIAGNOSIS — F410 Panic disorder [episodic paroxysmal anxiety] without agoraphobia: Secondary | ICD-10-CM

## 2021-05-14 DIAGNOSIS — F411 Generalized anxiety disorder: Secondary | ICD-10-CM

## 2021-05-14 NOTE — Progress Notes (Signed)
[facility] Counselor/Therapist Progress Note  Patient ID: LAVELLE BERLAND, MRN: [id number],    Date: 05/14/2021  Time Spent: 48 minutes 102-150 pm  Treatment Type: Individual Therapy  Mental Status Exam: Appearance:  Casual     Behavior: Appropriate and Sharing  Motor: Normal  Speech/Language:  Clear and Coherent and Normal Rate  Affect: Full Range  Mood: calm  Thought process: goal directed  Thought content:   WNL  Sensory/Perceptual disturbances:   WNL  Orientation: oriented to person, place, time/date, and situation  Attention: Good   Concentration: Good  Memory: Good  Fund of knowledge:  Good  Insight:   Good  Judgment:  Good  Impulse Control: Good   Risk Assessment: Danger to Self:  No Self-injurious Behavior: No Danger to Others: No Duty to Warn:no Physical Aggression / Violence:No  Access to Firearms a concern: No  Gang Involvement:No   Subjective: This session was held via video teletherapy due to the coronavirus risk at this time. The patient consented to video teletherapy and was located at her home during this session. She is aware it is the responsibility of the patient to secure confidentiality on her end of the session. The provider was in a private home office for the duration of this session.   The patient arrived late for her webex appointment appearing casually groomed and easily engaged.  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   -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   -will research volunteer options and academic options 3-Psychiatry -[facility] appointment with PA March 8th -pt has realized she needs a break from healthcare so she can figure out what she needs to do, and wants to do 4-Review PHQ-9/GAD-7 Date  PHQ-9  GAD-7 03/18/2021     14                       13 05/14/2021                 6                         4  Depression screen [facility] 2/9 05/14/2021 04/17/2021  Decreased Interest 2 3  Down, Depressed, Hopeless 1 3  PHQ - 2 Score 3 6  Altered sleeping 1 0  Tired, decreased energy 1 3  Change in appetite 1 1  Feeling bad or failure about yourself  0 3  Trouble concentrating 0 1  Moving slowly or fidgety/restless 0 3  Suicidal thoughts 0 0  PHQ-9 Score 6 17  Difficult doing work/chores Somewhat difficult Extremely dIfficult  Some encounter information is confidential and restricted. Go to Review Flowsheets activity to see all data.  Some recent data might be hidden    Problems Addressed  Anxiety, Eating Disorders  And Obesity, Unipolar Depression, Vocational Stress Goals 1. Alleviate depressive symptoms and return to previous level of effective functioning. 2. Appropriately grieve the loss in order to normalize mood and to return to previously adaptive level of functioning. 3. Develop healthy thinking patterns and beliefs about self, others, and the world that lead to the alleviation and help prevent the relapse of depression. 4. Improve satisfaction and comfort surrounding coworker relationships. Objective Identify and replace distorted cognitive messages associated with feelings of job stress. Target Date: 2022-04-01 Frequency: Weekly  Progress: 0 Modality: individual  Related Interventions Probe and clarify the client's emotions surrounding his/her vocational stress. Assess the client's  distorted cognitive messages and schema that foster his/her vocational stress; replace these messages with positive cognitions (or assign "Negative Thoughts Trigger Negative Feelings" in the Adult Psychotherapy Homework Planner by Stephannie LiJongsma). Confront the client's pattern of catastrophizing situations leading to immobilizing anxiety; replace these messages with realistic thoughts. Objective Develop and verbalize a plan for constructive action to reduce vocational stress. Target Date: 2022-04-01 Frequency: Weekly  Progress: 0 Modality: individual  Related Interventions Assist the client in developing a plan to react positively to his/her vocational situation (or assign "My Vocational Action Plan" in the Adult Psychotherapy Homework Planner by [facility]); process the proactive plan and assist in its implementation. 5. Increase job satisfaction and performance due to implementation of assertiveness and stress management strategies. 6. Increase sense of confidence and competence in dealing with work responsibilities. 7. Learn and implement coping skills that result in a reduction of anxiety and worry, and improved daily functioning. Objective Verbalize an understanding of the role that cognitive biases play in excessive irrational worry and persistent anxiety symptoms. Target Date: 2022-04-01 Frequency: Weekly  Progress: 0 Modality: individual  Related Interventions Discuss examples demonstrating that unrealistic worry typically overestimates the probability of threats and underestimates or overlooks the client's ability to manage realistic demands (or assign "Past Successful Anxiety Coping" in the Adult Psychotherapy Homework Planner by [facility]). Assist the client in analyzing his/her worries by examining potential biases such as the probability of the negative expectation occurring, the real consequences of it occurring, his/her ability to control the outcome, the worst possible outcome, and his/her  ability to accept it (see "Analyze the Probability of a Feared Event" in the Adult Psychotherapy Homework Planner by Stephannie LiJongsma; Cognitive Therapy of Anxiety Disorders by Laurence Slatelark and Beck). Objective Learn and implement calming skills to reduce overall anxiety and manage anxiety symptoms. Target Date: 2022-04-01 Frequency: Weekly  Progress: 0 Modality: individual  Related Interventions Teach the client calming/relaxation skills (e.g., applied relaxation, progressive muscle relaxation, cue controlled relaxation; mindful breathing; biofeedback) and how to discriminate better between relaxation and tension; teach the client how to apply these skills to his/her daily life (e.g., New Directions in Progressive Muscle Relaxation by Marcelyn DittyBernstein, Borkovec, and Hazlett-Stevens; Treating Generalized Anxiety Disorder by Rygh and Ida RogueSanderson). Objective Learn and implement relapse prevention strategies for managing possible future anxiety symptoms. Target Date: 2022-04-01 Frequency: Weekly  Progress: 0 Modality: individual  Related Interventions Discuss with the client the distinction between a lapse and relapse, associating a lapse with an initial and reversible return of worry, anxiety symptoms, or urges to avoid, and relapse with the decision to continue the fearful and avoidant patterns. Identify and rehearse with the client the management of future situations or circumstances in which lapses could occur. Instruct the client to routinely use new therapeutic skills (e.g., relaxation, cognitive restructuring, exposure, and problem-solving) in daily life to address emergent worries, anxiety, and avoidant  tendencies. Develop a "coping card" on which coping strategies and other important information (e.g., "Breathe deeply and relax," "Challenge unrealistic worries," "Use problem-solving") are written for the client's later use. Objective Learn and implement a strategy to limit the association between various environmental  settings and worry, delaying the worry until a designated "worry time." Target Date: 2022-04-01 Frequency: Weekly  Progress: 0 Modality: individual  Related Interventions Explain the rationale for using a worry time as well as how it is to be used; agree upon and implement a worry time with the client. Teach the client how to recognize, stop, and postpone worry to the agreed upon worry time using skills such as thought stopping, relaxation, and redirecting attention (or assign "Making Use of the Thought-Stopping Technique" and/or "Worry Time" in the Adult Psychotherapy Homework Planner by Jongsma to assist skill development); encourage use in daily life; review and reinforce success while providing corrective feedback toward improvement. Objective Utilize a paradoxical intervention technique to reduce the anxiety response. Target Date: 2022-04-01 Frequency: Weekly  Progress: 0 Modality: individual  Related Interventions Develop a paradoxical intervention (see Ordeal Therapy by Rolly Salter) in which the client is encouraged to have the problem (e.g., anxiety) and then schedule that anxiety to occur at specific intervals each day (at a time of day/night when the client would be clearly wanting to do something else) in a specific way and for a defined length of time. Objective Identify the major life conflicts from the past and present that form the basis for present anxiety. Target Date: 2022-04-01 Frequency: Weekly  Progress: 0 Modality: individual  Related Interventions Assist the client in becoming aware of key unresolved life conflicts and in starting to work toward their resolution. Reinforce the client's insights into the role of his/her past emotional pain and present anxiety. Ask the client to develop and process a list of key past and present life conflicts that continue to cause worry. 8. Pursue employment consistency with a reasonably hopeful and positive attitude. 9. Recognize, accept, and  cope with feelings of depression. Objective Learn and implement relapse prevention skills. Target Date: 2022-04-01 Frequency: Weekly  Progress: 0 Modality: individual  Related Interventions Identify and rehearse with the client the management of future situations or circumstances in which lapses could occur. Build the client's relapse prevention skills by helping him/her identify early warning signs of relapse and rehearsing the use of skills learned during therapy to manage them. Objective Learn and implement behavioral strategies to overcome depression. Target Date: 2022-04-01 Frequency: Weekly  Progress: 0 Modality: individual  Related Interventions Engage the client in "behavioral activation," increasing his/her activity level and contact with sources of reward, while identifying processes that inhibit activation (see Behavioral Activation for Depression by Katharine Look, Dimidjian, and Herman-Dunn; or assign "Identify and Schedule Pleasant Activities" in the Adult Psychotherapy Homework Planner by [facility]); use behavioral techniques such as instruction, rehearsal, role-playing, role reversal, as needed, to facilitate activity in the client's daily life; reinforce success. Assist the client in developing skills that increase the likelihood of deriving pleasure from behavioral activation (e.g., assertiveness skills, developing an exercise plan, less internal/more external focus, increased social involvement); reinforce success. Objective Verbalize an accurate understanding of depression. Target Date: 2022-04-01 Frequency: Weekly  Progress: 0 Modality: individual  Related Interventions Consistent with the treatment model, discuss how cognitive, behavioral, interpersonal, and/or other factors (e.g., family history) contribute to depression. Objective Increasingly verbalize hopeful and positive statements regarding self, others, and the future. Target Date: 2022-04-01 Frequency: Weekly  Progress: 0  Modality:  individual  10. Reduce overall frequency, intensity, and duration of the anxiety so that daily functioning is not impaired. 11. Resolve the core conflict that is the source of anxiety. 12. Stabilize anxiety level while increasing ability to function on a daily basis. 13. Terminate overeating and implement lifestyle changes that lead to weight loss and improved health. Objective Verbalize an understanding of relapse prevention and the distinction between a lapse and a relapse. Target Date: 2022-04-01 Frequency: Weekly  Progress: 0 Modality: individual  Related Interventions Identify with the client future situations or circumstances in which lapses could occur. Objective Learn and implement skills for managing urges to engage in unhealthy eating or weight loss practices. Target Date: 2022-04-01 Frequency: Weekly  Progress: 0 Modality: individual  Related Interventions Teach the client tailored skills to manage high-risk situations including distraction, positive self-talk, problem-solving, conflict resolution (e.g., empathy, active listening, "I messages," respectful communication, assertiveness without aggression, compromise), or other social/ communication skills; use modeling, role-playing, and behavior rehearsal to work through several current situations.  Diagnosis:Moderate episode of recurrent major depressive disorder (HCC)  Generalized anxiety disorder  Panic attacks  Plan:  -[name] is to contact HR to learn of requirements related to having to return to work or being able to give notice while off on leave -meet again on Monday, May 18, 2021 at [facility], [facility]

## 2021-05-19 ENCOUNTER — Encounter: Payer: Self-pay | Admitting: Physician Assistant

## 2021-05-20 NOTE — Telephone Encounter (Signed)
Can you print the old paperwork and extend the days?

## 2021-05-21 ENCOUNTER — Encounter: Payer: Self-pay | Admitting: Professional

## 2021-05-21 ENCOUNTER — Ambulatory Visit (INDEPENDENT_AMBULATORY_CARE_PROVIDER_SITE_OTHER): Payer: No Typology Code available for payment source | Admitting: Professional

## 2021-05-21 DIAGNOSIS — F411 Generalized anxiety disorder: Secondary | ICD-10-CM | POA: Diagnosis not present

## 2021-05-21 DIAGNOSIS — F331 Major depressive disorder, recurrent, moderate: Secondary | ICD-10-CM

## 2021-05-21 NOTE — Progress Notes (Signed)
[facility] Counselor/Therapist Progress Note ? ?Patient ID: Theresa DessertJitruttana A Gemmer, MRN: [id number],   ? ?Date: 05/21/2021 ? ?Time Spent: 48 minutes 302-350 pm ? ?Treatment Type: Individual Therapy ? ?Mental Status Exam: ?Appearance:  Casual     ?Behavior: Appropriate and Sharing  ?Motor: Normal  ?Speech/Language:  Clear and Coherent and Normal Rate  ?Affect: Full Range  ?Mood: calm  ?Thought process: goal directed  ?Thought content:   WNL  ?Sensory/Perceptual disturbances:   WNL  ?Orientation: oriented to person, place, time/date, and situation  ?Attention: Good   ?Concentration: Good  ?Memory: Good  ?Fund of knowledge:  Good  ?Insight:   Good  ?Judgment:  Good  ?Impulse Control: Good  ? ?Risk Assessment: ?Danger to Self:  No ?Self-injurious Behavior: No ?Danger to Others: No ?Duty to Warn:no ?Physical Aggression / Violence:No  ?Access to Firearms a concern: No  ?Gang Involvement:No  ? ?Subjective: This session was held via video teletherapy due to the coronavirus risk at this time. The patient consented to video teletherapy and was located at her home during this session. She is aware it is the responsibility of the patient to secure confidentiality on her end of the session. The provider was in a private home office for the duration of this session.  ? ?The patient arrived late for her webex appointment appearing casually groomed and easily engaged. ? ?Issues addressed: ?1-difficult week ?-pt reports that she had to use the hydroxyzine for increased anxiety ?-her best friend Theresa Beard attempted suicide by overdose Sunday night/Monday morning ?  -pt took her to hospital and [facility] ?-Lesly RubensteinJade is extending her leave until April 5th ?-pt reports she is not where she needs to be ?  -pt reports she still has a lot of anxiety and has no motivation ?  -she has tried to increased her structure but struggles to get things done ?  -she is easily overwhelmed ?2-homework ?-Laney Potashana is to contact HR  to learn of requirements related to having to return to work or being able to give notice while off on leave ?-she has learned that she can give notice while she is out on leave ?-Tonia GhentWendy Allison, Joretta Bacheloreno Atkins assistant, told her to have Lesly RubensteinJade write her out for the full twelve weeks so her position is no longer held ?  -she expects to begin making those decisions within the next two weeks ?3-mood ?-thinks she has taken some steps backwards ?-pt reports she feels relaxed, her anxiety is not as bad today ?  -yesterday her anxiety was high ?4-psychiatry ?-went to [facility] yesterday and saw PA Avelina LaineBrian White ?-he increased her meds to 150mg   ?-she will see him again on April 5th ?  -noticing that her medication keeps her thoughts stabile but she has no motivation ?  -feels like she has to force herself to do something ?    -once she engages in activity she can sustain ?  -"I just felt miserable to be honest with you", today she does not feel as miserable ?    -she used background noise to compliment getting her tasks done ?  -struggles to even pay her bills online ?  -"I feel frustrated because I'm ready to be up and moving but I don't feel that way at all" ?-Theresa Beard going in to the hospital threw me for a loop ?  -Theresa Beard calling her saying she needs to go to hospital that she had taken an overdose ?  -she was going through  tearful period of and pt was instructing her to call her therapist's office ?5-Review PHQ-9/GAD-7 ?Date  PHQ-9  GAD-7 ?03/18/2021     14                       13 ?05/14/2021                 6                         4 ?05/21/2021     10       10 ? ?Depression screen [facility] 2/9 05/21/2021 05/14/2021  ?Decreased Interest 3 2  ?Down, Depressed, Hopeless 2 1  ?PHQ - 2 Score 5 3  ?Altered sleeping 1 1  ?Tired, decreased energy 2 1  ?Change in appetite 1 1  ?Feeling bad or failure about yourself  1 0  ?Trouble concentrating 0 0  ?Moving slowly or fidgety/restless 0 0  ?Suicidal thoughts 0 0  ?PHQ-9 Score 10 6  ?Difficult  doing work/chores Very difficult Somewhat difficult  ?Some encounter information is confidential and restricted. Go to Review Flowsheets activity to see all data.  ?Some recent data might be hidden  ? ? ?6-Treatment Plan reviewed with patient ?-pt has made progress in all areas ?-pt continues to struggle with depressed mood and is following through with recommendations in therapy ?-she has only been on medication prescribed by Psychiatric practitioner for two weeks ?  -it appear that the pt will continue to improve as medications continue to be utilized ? ?Treatment Plan ?Problems Addressed Anxiety, Eating Disorders And Obesity, Unipolar Depression, Vocational Stress ?Goals ?1. Alleviate depressive symptoms and return to previous level of effective functioning. ?2. Appropriately grieve the loss in order to normalize mood and to return to previously adaptive level of functioning. ?3. Develop healthy thinking patterns and beliefs about self, others, and the world that lead to the alleviation and help prevent the relapse of depression. ?4. Improve satisfaction and comfort surrounding coworker relationships. ?Objective ?Identify and replace distorted cognitive messages associated with feelings of job stress. ?Target Date: 2022-04-01 Frequency: Weekly  ?Progress: 60 Modality: individual  ?Related Interventions ?Probe and clarify the client's emotions surrounding his/her vocational stress. ?Assess the client's distorted cognitive messages and schema that foster his/her vocational stress; replace these messages with positive cognitions (or assign "Negative Thoughts Trigger Negative Feelings" in the Adult Psychotherapy Homework Planner by Stephannie Li). ?Confront the client's pattern of catastrophizing situations leading to immobilizing anxiety; replace these messages with realistic thoughts. ?Objective ?Develop and verbalize a plan for constructive action to reduce vocational stress. ?Target Date: 2022-04-01 Frequency: Weekly   ?Progress: 70 Modality: individual  ?Related Interventions ?Assist the client in developing a plan to react positively to his/her vocational situation (or assign "My Vocational Action Plan" in the Adult Psychotherapy Homework Planner by [facility]); process the proactive plan and assist in its implementation. ?5. Increase job satisfaction and performance due to implementation of assertiveness and stress management strategies. ?6. Increase sense of confidence and competence in dealing with work responsibilities. ?7. Learn and implement coping skills that result in a reduction of anxiety and worry, and improved daily functioning. ?Objective ?Verbalize an understanding of the role that cognitive biases play in excessive irrational worry and persistent anxiety symptoms. ?Target Date: 2022-04-01 Frequency: Weekly  ?Progress: 70 Modality: individual  ?Related Interventions ?Discuss examples demonstrating that unrealistic worry typically overestimates the probability of threats and underestimates or overlooks the client's ability to  manage realistic demands (or assign "Past Successful Anxiety Coping" in the Adult Psychotherapy Homework Planner by Stephannie Li). ?Assist the client in analyzing his/her worries by examining potential biases such as the probability of the negative expectation occurring, the real consequences of it occurring, his/her ability to control the outcome, the worst possible outcome, and his/her ability to accept it (see "Analyze the Probability of a Feared Event" in the Adult Psychotherapy Homework Planner by Stephannie Li; Cognitive Therapy of Anxiety Disorders by Laurence Slate). ?Objective ?Learn and implement calming skills to reduce overall anxiety and manage anxiety symptoms. ?Target Date: 2022-04-01 Frequency: Weekly  ?Progress: 80 Modality: individual  ?Related Interventions ?Teach the client calming/relaxation skills (e.g., applied relaxation, progressive muscle relaxation, cue controlled relaxation;  mindful breathing; biofeedback) and how to discriminate better between relaxation and tension; teach the client how to apply these skills to his/her daily life (e.g., New Directions in Progressive Muscle Rela

## 2021-05-21 NOTE — Telephone Encounter (Signed)
Forms completed and faxed to [employer] per patient request at [phone] with confirmation received.  ?

## 2021-05-26 ENCOUNTER — Other Ambulatory Visit: Payer: Self-pay | Admitting: Physician Assistant

## 2021-05-26 ENCOUNTER — Encounter: Payer: Self-pay | Admitting: Physician Assistant

## 2021-05-26 DIAGNOSIS — G479 Sleep disorder, unspecified: Secondary | ICD-10-CM

## 2021-05-28 ENCOUNTER — Encounter: Payer: Self-pay | Admitting: Professional

## 2021-05-28 ENCOUNTER — Ambulatory Visit (INDEPENDENT_AMBULATORY_CARE_PROVIDER_SITE_OTHER): Payer: No Typology Code available for payment source | Admitting: Professional

## 2021-05-28 DIAGNOSIS — F411 Generalized anxiety disorder: Secondary | ICD-10-CM

## 2021-05-28 DIAGNOSIS — F331 Major depressive disorder, recurrent, moderate: Secondary | ICD-10-CM
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
## 2021-06-04 ENCOUNTER — Encounter: Payer: Self-pay | Admitting: Professional

## 2021-06-04 ENCOUNTER — Ambulatory Visit (INDEPENDENT_AMBULATORY_CARE_PROVIDER_SITE_OTHER): Payer: No Typology Code available for payment source | Admitting: Professional

## 2021-06-04 DIAGNOSIS — F331 Major depressive disorder, recurrent, moderate: Secondary | ICD-10-CM | POA: Diagnosis not present

## 2021-06-04 DIAGNOSIS — F411 Generalized anxiety disorder: Secondary | ICD-10-CM
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
## 2021-06-10 ENCOUNTER — Other Ambulatory Visit (HOSPITAL_BASED_OUTPATIENT_CLINIC_OR_DEPARTMENT_OTHER): Payer: Self-pay

## 2021-06-10 ENCOUNTER — Ambulatory Visit (INDEPENDENT_AMBULATORY_CARE_PROVIDER_SITE_OTHER): Payer: No Typology Code available for payment source | Admitting: Behavioral Health

## 2021-06-10 ENCOUNTER — Encounter: Payer: Self-pay | Admitting: Behavioral Health

## 2021-06-10 DIAGNOSIS — F5105 Insomnia due to other mental disorder: Secondary | ICD-10-CM

## 2021-06-10 DIAGNOSIS — F411 Generalized anxiety disorder: Secondary | ICD-10-CM | POA: Diagnosis not present

## 2021-06-10 DIAGNOSIS — F410 Panic disorder [episodic paroxysmal anxiety] without agoraphobia: Secondary | ICD-10-CM

## 2021-06-10 DIAGNOSIS — F99 Mental disorder, not otherwise specified: Secondary | ICD-10-CM

## 2021-06-10 DIAGNOSIS — F4323 Adjustment disorder with mixed anxiety and depressed mood: Secondary | ICD-10-CM

## 2021-06-10 DIAGNOSIS — F331 Major depressive disorder, recurrent, moderate: Secondary | ICD-10-CM | POA: Diagnosis not present

## 2021-06-10 MED ORDER — SERTRALINE HCL 100 MG PO TABS
200.0000 mg | ORAL_TABLET | Freq: Every day | ORAL | 2 refills | Status: DC
  Filled 2021-06-10 – 2021-06-25 (×2): qty 60, 30d supply, fill #0
  Filled 2021-08-20: qty 60, 30d supply, fill #1

## 2021-06-10 MED ORDER — BELSOMRA 20 MG PO TABS
20.0000 mg | ORAL_TABLET | Freq: Every day | ORAL | 1 refills | Status: DC
  Filled 2021-06-10 – 2021-07-07 (×2): qty 30, 30d supply, fill #0

## 2021-06-10 NOTE — Progress Notes (Signed)
[facility] ? ?Patient ID: Theresa Beard,  ?MRN: [id number] ? ?PCP: Jomarie Longs, PA-C ? ?Date of Evaluation: 06/10/2021 ?Time spent:30 minutes ? ?Chief Complaint:  ?Chief Complaint   ?Anxiety; Depression; Follow-up; Medication Problem ?  ? ? ?HISTORY/CURRENT STATUS: ?HPI ? ?46 year old female presents to this office for follow up and to establish care. She feels like her medication is minimally working. She would like to try increasing Zoloft or switching medication. She says that she cannot go back to work right now. She endorses irritability, insomnia, rumination, and racing thoughts.  Says that she neglects things like cleaning her home because she has no motivation. She feels like the stress has effected her memory. Says that her anxiety level is 7/10 and depression is 7 of 10 right now. She does sleep 7-8 hours per night. She endorses lack of concentration but she says that it is due to her emotional state right now. She would like to consider adjustment or change to her medication. She denies mania, no psychosis, no SI/HI. She is requesting Long-Term disability documentation.  ?  ?Prior psychiatric medication: ?Lexapro-facial and lip swelling ?Wellbutin-unknown ? ? ?Individual Medical History/ Review of Systems: Changes? :No  ? ?Allergies: Lexapro [escitalopram], Metformin and related, and Lisinopril ? ?Current Medications:  ?Current Outpatient Medications:  ?  sertraline (ZOLOFT) 100 MG tablet, Take 2 tablets (200 mg total) by mouth daily., Disp: 60 tablet, Rfl: 2 ?  Suvorexant (BELSOMRA) 20 MG TABS, Take 1 tablet (20 mg) by mouth at bedtime., Disp: 30 tablet, Rfl: 1 ?  atorvastatin (LIPITOR) 20 MG tablet, Take 1 tablet (20 mg total) by mouth daily., Disp: 90 tablet, Rfl: 3 ?  Dulaglutide 1.5 MG/0.5ML SOPN, Inject 1.5 mg into the skin once a week., Disp: 6 mL, Rfl: 1 ?  esomeprazole (NEXIUM) 40 MG capsule, Take 1 capsule (40 mg total) by mouth daily., Disp: 90 capsule, Rfl: 3 ?   hydrOXYzine (ATARAX) 10 MG tablet, Take 1 tablet (10 mg total) by mouth 3 (three) times daily as needed for anxiety., Disp: 90 tablet, Rfl: 0 ?  losartan (COZAAR) 100 MG tablet, Take 1 tablet (100 mg total) by mouth daily., Disp: 90 tablet, Rfl: 1 ?  sertraline (ZOLOFT) 100 MG tablet, Take 1 & 1/2 tablet (150 mg total) by mouth daily., Disp: 45 tablet, Rfl: 2 ?  traZODone (DESYREL) 50 MG tablet, Take 1/2 -1 tablet (25-50 mg total) by mouth at bedtime as needed for sleep., Disp: 30 tablet, Rfl: 2 ?Medication Side Effects: none ? ?Family Medical/ Social History: Changes? No ? ?MENTAL HEALTH EXAM: ? ?There were no vitals taken for this visit.There is no height or weight on file to calculate BMI.  ?General Appearance: Casual, Neat, and Well Groomed  ?Eye Contact:  Good  ?Speech:  Clear and Coherent  ?Volume:  Normal  ?Mood:  Anxious, Depressed, and Dysphoric  ?Affect:  Appropriate, Non-Congruent, Depressed, and Anxious  ?Thought Process:  Coherent  ?Orientation:  Full (Time, Place, and Person)  ?Thought Content: Logical   ?Suicidal Thoughts:  No  ?Homicidal Thoughts:  No  ?Memory:  WNL  ?Judgement:  Good  ?Insight:  Good  ?Psychomotor Activity:  Normal  ?Concentration:  Concentration: Good  ?Recall:  Good  ?Fund of Knowledge: Good  ?Language: Good  ?Assets:  Desire for Improvement ?Physical Health ?Resilience  ?ADL's:  Intact  ?Cognition: WNL  ?Prognosis:  Good  ? ? ?DIAGNOSES:  ?  ICD-10-CM   ?1. Insomnia due to other mental  disorder  F51.05 Suvorexant (BELSOMRA) 20 MG TABS  ? F99   ?  ?2. Moderate episode of recurrent major depressive disorder (HCC)  F33.1 sertraline (ZOLOFT) 100 MG tablet  ?  ?3. Generalized anxiety disorder  F41.1 sertraline (ZOLOFT) 100 MG tablet  ?  ?4. Panic attacks  F41.0 sertraline (ZOLOFT) 100 MG tablet  ?  ?5. Adjustment disorder with mixed anxiety and depressed mood  F43.23 sertraline (ZOLOFT) 100 MG tablet  ?  ? ? ?Receiving Psychotherapy: No  ? ? ?RECOMMENDATIONS:  ? ?Greater than 50% of   30 min face to face time with patient was spent on counseling and coordination of care. We discussed her lack of improvement with anxiety and depression since last visit. She feels like her depression is paralyzing and does not feel that she can function in a working environment right now. She is reporting poor broken sleep currently. We discussed proper sleep hygiene.  She is requesting medication that may help with sleep. She has been isolating some at home. We discussed healthy coping mechanisms. She is requesting documentation be filled out for long term disability.  ?We discussed her current tx and medication options. Discussed her long term goals and plan of care. She stated that she just wants to feel better but is unable to function enough emotionally to hold job.  ?We agreed to increase Zoloft to 200 mg daily ?To start Belsomra 20 mg daily at bedtime for sleep.  ?To continue Hydroxyzine 10 mg three times daily as needed. ?To continue Trazodone 50 mg at bedtime daily ?Will report worsening symptoms or side effects ?To follow up in 4 weeks to reassess ?[facility] three weeks ago. No BC, not sexually active ?Reviewed PDMP ? ?She is requesting documentation for Long Term Disability be completed by 4/10. Discussed with Traci S. to assist.  ? ? ?Joan Flores, NP  ?

## 2021-06-17 DIAGNOSIS — Z0289 Encounter for other administrative examinations: Secondary | ICD-10-CM

## 2021-06-19 ENCOUNTER — Other Ambulatory Visit (HOSPITAL_BASED_OUTPATIENT_CLINIC_OR_DEPARTMENT_OTHER): Payer: Self-pay

## 2021-06-19 ENCOUNTER — Encounter: Payer: Self-pay | Admitting: Professional

## 2021-06-19 ENCOUNTER — Ambulatory Visit (INDEPENDENT_AMBULATORY_CARE_PROVIDER_SITE_OTHER): Payer: No Typology Code available for payment source | Admitting: Professional

## 2021-06-19 DIAGNOSIS — F411 Generalized anxiety disorder: Secondary | ICD-10-CM | POA: Diagnosis not present

## 2021-06-19 DIAGNOSIS — F331 Major depressive disorder, recurrent, moderate: Secondary | ICD-10-CM

## 2021-06-19 NOTE — Progress Notes (Signed)
[facility] Counselor/Therapist Progress Note ? ?Patient ID: Theresa Beard, MRN: [id number],   ? ?Date: 06/19/2021 ? ?Time Spent: 45 minutes 12-1245pm ? ?Risk Assessment: ?Danger to Self:  No ?Self-injurious Behavior: No ?Danger to Others: No ? ?Subjective: This session was held via video teletherapy due to the coronavirus risk at this time. The patient consented to video teletherapy and was located at her home during this session. She is aware it is the responsibility of the patient to secure confidentiality on her end of the session. The provider was in a private home office for the duration of this session.  ? ?The patient arrived on time for her webex appointment. ? ?Issues addressed: ?1-medication ?-Zoloft increased to 200mg  by provider ?2-mood ?-activity level has increased ?-anxiety level has been up and down ?  -she was anxious related to her short term disability paperwork ?    -took seven phone calls before she got an answer that her paperwork was sent ?-she feels she is struggling more with the depression ?-she has been cooking for her family ?-her friend is doing a lot better and pt feels good about how she is doing  ? ?  06/19/2021  ? 12:09 PM 06/04/2021  ?  8:41 AM  ?Depression screen PHQ 2/9  ?Decreased Interest 1 1  ?Down, Depressed, Hopeless 0 0  ?PHQ - 2 Score 1 1  ?Altered sleeping 1 3  ?Tired, decreased energy 3 1  ?Change in appetite 1 1  ?Feeling bad or failure about yourself  0 0  ?Trouble concentrating 0 0  ?Moving slowly or fidgety/restless 1 0  ?Suicidal thoughts 0 0  ?PHQ-9 Score 7 6  ?Difficult doing work/chores Very difficult Somewhat difficult  ? ? ?2-contact with coworker Nicki ?-called from work and she was triggered "a little bit" ?-having a hard time with her boss ?-was supportive of patient ?3-sleep ?-pt is sleeping much better ?-she has not been using the medication ?-she is using a weighted ?4-social ?-continues to be engaging in social activities with  friends ?-out and about a little more than she has been ?5-professional ?-has not decided when to send her notice ?-going to reach out to HR to confirm details ?  -plans to get in writing so that she has proof in case something occurs ? ?Treatment Plan ?Problems Addressed Anxiety, Eating Disorders And Obesity, Unipolar Depression, Vocational Stress ?Goals ?1. Alleviate depressive symptoms and return to previous level of effective functioning. ?2. Appropriately grieve the loss in order to normalize mood and to return to previously adaptive level of functioning. ?3. Develop healthy thinking patterns and beliefs about self, others, and the world that lead to the alleviation and help prevent the relapse of depression. ?4. Improve satisfaction and comfort surrounding coworker relationships. ?Objective ?Identify and replace distorted cognitive messages associated with feelings of job stress. ?Target Date: 2022-04-01 Frequency: Weekly  ?Progress: 60 Modality: individual  ?Related Interventions ?Probe and clarify the client's emotions surrounding his/her vocational stress. ?Assess the client's distorted cognitive messages and schema that foster his/her vocational stress; replace these messages with positive cognitions (or assign "Negative Thoughts Trigger Negative Feelings" in the Adult Psychotherapy Homework Planner by 2022-04-03). ?Confront the client's pattern of catastrophizing situations leading to immobilizing anxiety; replace these messages with realistic thoughts. ?Objective ?Develop and verbalize a plan for constructive action to reduce vocational stress. ?Target Date: 2022-04-01 Frequency: Weekly  ?Progress: 70 Modality: individual  ?Related Interventions ?Assist the client in developing a plan to react positively to his/her  vocational situation (or assign "My Vocational Action Plan" in the Adult Psychotherapy Homework Planner by [facility]); process the proactive plan and assist in its implementation. ?5. Increase job  satisfaction and performance due to implementation of assertiveness and stress management strategies. ?6. Increase sense of confidence and competence in dealing with work responsibilities. ?7. Learn and implement coping skills that result in a reduction of anxiety and worry, and improved daily functioning. ?Objective ?Verbalize an understanding of the role that cognitive biases play in excessive irrational worry and persistent anxiety symptoms. ?Target Date: 2022-04-01 Frequency: Weekly  ?Progress: 70 Modality: individual  ?Related Interventions ?Discuss examples demonstrating that unrealistic worry typically overestimates the probability of threats and underestimates or overlooks the client's ability to manage realistic demands (or assign "Past Successful Anxiety Coping" in the Adult Psychotherapy Homework Planner by [facility]). ?Assist the client in analyzing his/her worries by examining potential biases such as the probability of the negative expectation occurring, the real consequences of it occurring, his/her ability to control the outcome, the worst possible outcome, and his/her ability to accept it (see "Analyze the Probability of a Feared Event" in the Adult Psychotherapy Homework Planner by Stephannie LiJongsma; Cognitive Therapy of Anxiety Disorders by Laurence Slatelark and Beck). ?Objective ?Learn and implement calming skills to reduce overall anxiety and manage anxiety symptoms. ?Target Date: 2022-04-01 Frequency: Weekly  ?Progress: 80 Modality: individual  ?Related Interventions ?Teach the client calming/relaxation skills (e.g., applied relaxation, progressive muscle relaxation, cue controlled relaxation; mindful breathing; biofeedback) and how to discriminate better between relaxation and tension; teach the client how to apply these skills to his/her daily life (e.g., New Directions in Progressive Muscle Relaxation by Marcelyn DittyBernstein, Borkovec, and Hazlett-Stevens; Treating Generalized Anxiety Disorder by Rygh and  Ida RogueSanderson). ?Objective ?Learn and implement relapse prevention strategies for managing possible future anxiety symptoms. ?Target Date: 2022-04-01 Frequency: Weekly  ?Progress: 80 Modality: individual  ?Related Interventions ?Discuss with the client the distinction between a lapse and relapse, associating a lapse with an initial and reversible return of worry, anxiety symptoms, or urges to avoid, and relapse with the decision to continue the fearful and avoidant patterns. ?Identify and rehearse with the client the management of future situations or circumstances in which lapses could occur. ?Instruct the client to routinely use new therapeutic skills (e.g., relaxation, cognitive restructuring, exposure, and problem-solving) in daily life to address emergent worries, anxiety, and avoidant tendencies. ?Develop a "coping card" on which coping strategies and other important information (e.g., "Breathe deeply and relax," "Challenge unrealistic worries," "Use problem-solving") are written for the client's later use. ?Objective ?Learn and implement a strategy to limit the association between various environmental settings and worry, delaying the worry until a designated "worry time." ?Target Date: 2022-04-01 Frequency: Weekly  ?Progress: 80 Modality: individual  ?Related Interventions ?Explain the rationale for using a worry time as well as how it is to be used; agree upon and implement a worry time with the client. ?Teach the client how to recognize, stop, and postpone worry to the agreed upon worry time using skills such as thought stopping, relaxation, and redirecting attention (or assign "Making Use of the Thought-Stopping Technique" and/or "Worry Time" in the Adult Psychotherapy Homework Planner by Jongsma to assist skill development); encourage use in daily life; review and reinforce success while providing corrective feedback toward improvement. ?Objective ?Utilize a paradoxical intervention technique to reduce the  anxiety response. ?Target Date: 2022-04-01 Frequency: Weekly  ?Progress: 60 Modality: individual  ?Related Interventions ?Develop a paradoxical intervention (see Ordeal Therapy by Rolly SalterHaley) in which the client  is encouraged to hav

## 2021-06-23 ENCOUNTER — Other Ambulatory Visit (HOSPITAL_BASED_OUTPATIENT_CLINIC_OR_DEPARTMENT_OTHER): Payer: Self-pay

## 2021-06-25 ENCOUNTER — Other Ambulatory Visit (HOSPITAL_BASED_OUTPATIENT_CLINIC_OR_DEPARTMENT_OTHER): Payer: Self-pay

## 2021-06-30 ENCOUNTER — Ambulatory Visit: Payer: No Typology Code available for payment source | Admitting: Professional

## 2021-06-30 ENCOUNTER — Telehealth: Payer: Self-pay | Admitting: Behavioral Health

## 2021-06-30 NOTE — Telephone Encounter (Signed)
Received Medical Certification Form given to [facility] 4/25. Patient needs forms done ASAP ?

## 2021-07-02 NOTE — Telephone Encounter (Signed)
Called patient. The Belsomra makes her drowsy but she still isn't sleeping, said it may be 3-4 in the morning before she gets to sleep and then she struggles to get up in the morning. She did say she went to the furniture market one day and wore herself out walking and slept that night. I asked if she tried getting outside and walking or some other sort of exercise routine and she said she had. She denied racing thoughts keeping her from sleeping. She is not taking the trazodone per your direction. She is taking 200 mg of Zoloft. She is only taking the hydroxyzine if she feels anxious.  She has an appt with you 5/3 and said she could wait to then to discuss if needed, but she just really wants to get some sleep.  ?

## 2021-07-02 NOTE — Telephone Encounter (Signed)
Tell her to try taking the Trazodone  and the hydroxyzine together at bedtime and see if this helps. Theresa Beard has form. Please check with her to see if it has been completed. Make sure she is practicing good sleep hygiene. Cannot stay up on Belsomra. You have to take 30 min before bed, lights out, tv off, dark room, close eye. If you stay up with lights on, will not be effective.

## 2021-07-02 NOTE — Telephone Encounter (Signed)
Pt called back asking about the status of the form.  She also advised that the Belsomra med is not working for her.  She is miserable.  She said it make her drowsy, but that's it. ? ?Pls call her back. ? ?Next appt 5/3 ?

## 2021-07-03 DIAGNOSIS — Z0289 Encounter for other administrative examinations: Secondary | ICD-10-CM

## 2021-07-03 NOTE — Telephone Encounter (Signed)
She should be able to come by after lunch on Monday to pick up form.

## 2021-07-03 NOTE — Telephone Encounter (Signed)
Patient not taking trazodone since starting Belsomra. Recommendations given to her.  ?

## 2021-07-03 NOTE — Telephone Encounter (Signed)
Form completed will give to Arlys John to sign. ?

## 2021-07-07 ENCOUNTER — Other Ambulatory Visit (HOSPITAL_BASED_OUTPATIENT_CLINIC_OR_DEPARTMENT_OTHER): Payer: Self-pay

## 2021-07-07 NOTE — Telephone Encounter (Signed)
Just want to be sure form is ready for patient to pick up.  ?

## 2021-07-07 NOTE — Telephone Encounter (Signed)
Form has been ready and faxed but pt asking to pick up copy at her visit. She has an envelope up front with the pw ?

## 2021-07-07 NOTE — Telephone Encounter (Signed)
I signed with form, Gloris Manchester has it. Check in with her.

## 2021-07-08 ENCOUNTER — Encounter: Payer: Self-pay | Admitting: Behavioral Health

## 2021-07-08 ENCOUNTER — Ambulatory Visit (INDEPENDENT_AMBULATORY_CARE_PROVIDER_SITE_OTHER): Payer: No Typology Code available for payment source | Admitting: Behavioral Health

## 2021-07-08 ENCOUNTER — Other Ambulatory Visit (HOSPITAL_BASED_OUTPATIENT_CLINIC_OR_DEPARTMENT_OTHER): Payer: Self-pay

## 2021-07-08 DIAGNOSIS — F5105 Insomnia due to other mental disorder: Secondary | ICD-10-CM

## 2021-07-08 DIAGNOSIS — F410 Panic disorder [episodic paroxysmal anxiety] without agoraphobia: Secondary | ICD-10-CM

## 2021-07-08 DIAGNOSIS — F411 Generalized anxiety disorder: Secondary | ICD-10-CM | POA: Diagnosis not present

## 2021-07-08 DIAGNOSIS — F331 Major depressive disorder, recurrent, moderate: Secondary | ICD-10-CM | POA: Diagnosis not present

## 2021-07-08 DIAGNOSIS — F4323 Adjustment disorder with mixed anxiety and depressed mood: Secondary | ICD-10-CM

## 2021-07-08 DIAGNOSIS — F99 Mental disorder, not otherwise specified: Secondary | ICD-10-CM

## 2021-07-08 MED ORDER — BUPROPION HCL ER (XL) 150 MG PO TB24
150.0000 mg | ORAL_TABLET | Freq: Every day | ORAL | 1 refills | Status: DC
  Filled 2021-07-08: qty 30, 30d supply, fill #0
  Filled 2021-08-20: qty 30, 30d supply, fill #1

## 2021-07-08 NOTE — Telephone Encounter (Signed)
Noted. thank you for all your help.

## 2021-07-08 NOTE — Progress Notes (Signed)
[facility] Check ? ?Patient ID: Theresa Beard,  ?MRN: [id number] ? ?PCP: Donella Stade, PA-C ? ?Date of Evaluation: 07/08/2021 ?Time spent:30 minutes ? ?Chief Complaint:  ?Chief Complaint   ?Anxiety; Depression; Follow-up; Medication Refill; Panic Attack ?  ? ? ?HISTORY/CURRENT STATUS: ?HPI ? ?46 year old female presents to this office for follow up and to establish care. Feel like she is finally turning the corner and has made moderate improvement. Says she feels like "fixing up". She went and had hair cut and colored. She has been getting out of the house more often. She says she feels like she is about 50% better.  Says that her anxiety level is 4/10 and depression is 4 /10 right now. She does sleep 7-8 hours per night and it has improved since taking the hydroxyzine at bedtime along with her other rx meds. She is requesting augmenting her AD with something else if possible to see if she will continue to improve.  She denies mania, no psychosis, no SI/HI. She is requesting Long-Term disability documentation.  ?  ?Prior psychiatric medication: ?Lexapro-facial and lip swelling ?Wellbutin-unknown ? ? ? ? ? ?Individual Medical History/ Review of Systems: Changes? :No  ? ?Allergies: Lexapro [escitalopram], Metformin and related, and Lisinopril ? ?Current Medications:  ?Current Outpatient Medications:  ?  buPROPion (WELLBUTRIN XL) 150 MG 24 hr tablet, Take 1 tablet (150 mg total) by mouth daily., Disp: 30 tablet, Rfl: 1 ?  atorvastatin (LIPITOR) 20 MG tablet, Take 1 tablet (20 mg total) by mouth daily., Disp: 90 tablet, Rfl: 3 ?  Dulaglutide 1.5 MG/0.5ML SOPN, Inject 1.5 mg into the skin once a week., Disp: 6 mL, Rfl: 1 ?  esomeprazole (NEXIUM) 40 MG capsule, Take 1 capsule (40 mg total) by mouth daily., Disp: 90 capsule, Rfl: 3 ?  hydrOXYzine (ATARAX) 10 MG tablet, Take 1 tablet (10 mg total) by mouth 3 (three) times daily as needed for anxiety., Disp: 90 tablet, Rfl: 0 ?  losartan (COZAAR) 100 MG  tablet, Take 1 tablet (100 mg total) by mouth daily., Disp: 90 tablet, Rfl: 1 ?  sertraline (ZOLOFT) 100 MG tablet, Take 1 & 1/2 tablet (150 mg total) by mouth daily., Disp: 45 tablet, Rfl: 2 ?  sertraline (ZOLOFT) 100 MG tablet, Take 2 tablets (200 mg total) by mouth daily., Disp: 60 tablet, Rfl: 2 ?  Suvorexant (BELSOMRA) 20 MG TABS, Take 1 tablet (20 mg) by mouth at bedtime., Disp: 30 tablet, Rfl: 1 ?  traZODone (DESYREL) 50 MG tablet, Take 1/2 -1 tablet (25-50 mg total) by mouth at bedtime as needed for sleep., Disp: 30 tablet, Rfl: 2 ?Medication Side Effects: none ? ?Family Medical/ Social History: Changes? No ? ?MENTAL HEALTH EXAM: ? ?There were no vitals taken for this visit.There is no height or weight on file to calculate BMI.  ?General Appearance: Casual, Neat, and Well Groomed  ?Eye Contact:  Good  ?Speech:  Clear and Coherent  ?Volume:  Normal  ?Mood:  NA  ?Affect:  Appropriate  ?Thought Process:  Coherent  ?Orientation:  Full (Time, Place, and Person)  ?Thought Content: Logical   ?Suicidal Thoughts:  No  ?Homicidal Thoughts:  No  ?Memory:  WNL  ?Judgement:  Good  ?Insight:  Good  ?Psychomotor Activity:  Normal  ?Concentration:  Concentration: Good  ?Recall:  Good  ?Fund of Knowledge: Good  ?Language: Good  ?Assets:  Desire for Improvement  ?ADL's:  Intact  ?Cognition: WNL  ?Prognosis:  Good  ? ? ?DIAGNOSES:  ?  ICD-10-CM   ?1. Moderate episode of recurrent major depressive disorder (HCC)  F33.1 buPROPion (WELLBUTRIN XL) 150 MG 24 hr tablet  ?  ?2. Generalized anxiety disorder  F41.1 buPROPion (WELLBUTRIN XL) 150 MG 24 hr tablet  ?  ?3. Insomnia due to other mental disorder  F51.05   ? F99   ?  ?4. Panic attacks  F41.0   ?  ?5. Adjustment disorder with mixed anxiety and depressed mood  F43.23 buPROPion (WELLBUTRIN XL) 150 MG 24 hr tablet  ?  ? ? ?Receiving Psychotherapy: Yes  ? ? ?RECOMMENDATIONS:  ? ?Greater than 50% of  30 min face to face time with patient was spent on counseling and coordination of  care. We discussed her recently moderate improvement with anxiety and depression. She feels like she is finally starting to turn the corner and feels like medication is starting to help. Discussed her short term goals of returning to work.  ?To continue Zoloft 200 mg daily ?To start Wellbutrin 150 mg XL daily ?Continue  Belsomra 20 mg daily at bedtime for sleep.  ?To continue Hydroxyzine 10 mg three times daily and at bedtime as needed.  ?To continue Trazodone 50 mg at bedtime daily ?Will report worsening symptoms or side effects ?To follow up in 6 weeks to reassess ?[facility] three weeks ago. No BC, not sexually active ?Reviewed PDMP ? ? ? ? ?Elwanda Brooklyn, NP  ?

## 2021-07-15 ENCOUNTER — Encounter: Payer: Self-pay | Admitting: Professional

## 2021-07-15 ENCOUNTER — Ambulatory Visit (INDEPENDENT_AMBULATORY_CARE_PROVIDER_SITE_OTHER): Payer: No Typology Code available for payment source | Admitting: Professional

## 2021-07-15 DIAGNOSIS — F411 Generalized anxiety disorder: Secondary | ICD-10-CM

## 2021-07-15 DIAGNOSIS — F331 Major depressive disorder, recurrent, moderate: Secondary | ICD-10-CM | POA: Diagnosis not present

## 2021-07-15 NOTE — Progress Notes (Signed)
[facility] Counselor/Therapist Progress Note ? ?Patient ID: Theresa Beard, MRN: [id number],   ? ?Date: 07/15/2021 ? ?Time Spent: 51 minutes 1001-1052am ? ?Risk Assessment: ?Danger to Self:  No ?Self-injurious Behavior: No ?Danger to Others: No ? ?Subjective: This session was held via video teletherapy due to the coronavirus risk at this time. The patient consented to video teletherapy and was located at her home during this session. She is aware it is the responsibility of the patient to secure confidentiality on her end of the session. The provider was in a private home office for the duration of this session.  ? ?The patient arrived on time for her webex appointment. ? ?Issues addressed: ?1-medication ?-Zoloft 200mg  ?-Wellbutrin 150mg  began on 5/9 ?-sleep has improved ?  -sleeping the entire night ?2-mood ?-depressed at a 4-5/1-, noticed increased activity and has began doing things she enjoys ?  -she was making jewelry and she has not touched it in months ?  -her creativity was squelched and now it has returned ?  -she is starting to feel like herself ?  -"I'm started to feel sassy" ?-pt reports she felt compromised "slightly before Covid but when Covid hit it took it's toll  ?on me" ?  -she had tried to secure other jobs in the system but did not get ?-educated on how to manage anxiety ?3-sleep ?-sleeping and not using the medication ?-awakens to go to bathroom and goes right back to sleep ?4-social ?-se has contact with her bff daily ?  -she is dating someone and she is so happy ?  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  -pt confident that "the [place]y [place] will work it out" ?6-dog ?-pt wants a new dog and she is using hat as her long term goal of getting better ? ?Treatment Plan ?Problems Addressed Anxiety, Eating Disorders And Obesity, Unipolar Depression, Vocational Stress ?Goals ?1. Alleviate depressive symptoms and return to previous level of effective functioning. ?2. Appropriately grieve the loss in order to normalize mood and to return to previously adaptive level of functioning. ?3. Develop healthy thinking patterns and beliefs about self, others, and the world that lead to the alleviation and help prevent the relapse of depression. ?4. Improve satisfaction and comfort surrounding coworker relationships. ?Objective ?Identify and replace distorted cognitive messages associated with feelings of job stress. ?Target Date: 2022-04-01 Frequency: Weekly  ?Progress: 60 Modality: individual  ?Related Interventions ?Probe and clarify the client's emotions surrounding his/her vocational stress. ?Assess the client's distorted cognitive messages and schema that foster his/her vocational stress; replace these messages with positive cognitions (or assign "Negative Thoughts Trigger Negative Feelings" in the Adult Psychotherapy Homework Planner by Shaune Pollack). ?Confront the client's pattern of catastrophizing situations leading to immobilizing anxiety; replace these messages with realistic thoughts. ?Objective ?Develop and verbalize a plan for constructive action to reduce vocational stress. ?Target Date: 2022-04-01 Frequency: Weekly  ?Progress:  70 Modality: individual  ?Related Interventions ?Assist the client in developing a plan to react positively to his/her vocational situation (or assign "My Vocational Action Plan" in the Adult Psychotherapy Homework Planner by  [facility]); process the proactive plan and assist in its implementation. ?5. Increase job satisfaction and performance due to implementation of assertiveness and stress management strategies. ?6. Increase sense of confidence and competence in dealing with work responsibilities. ?7. Learn and implement coping skills that result in a reduction of anxiety and worry, and improved daily functioning. ?Objective ?Verbalize an understanding of the role that cognitive biases play in excessive irrational worry and persistent anxiety symptoms. ?Target Date: 2022-04-01 Frequency: Weekly  ?Progress: 70 Modality: individual  ?Related Interventions ?Discuss examples demonstrating that unrealistic worry typically overestimates the probability of threats and underestimates or overlooks the client's ability to manage realistic demands (or assign "Past Successful Anxiety Coping" in the Adult Psychotherapy Homework Planner by [facility]). ?Assist the client in analyzing his/her worries by examining potential biases such as the probability of the negative expectation occurring, the real consequences of it occurring, his/her ability to control the outcome, the worst possible outcome, and his/her ability to accept it (see "Analyze the Probability of a Feared Event" in the Adult Psychotherapy Homework Planner by Stephannie Li; Cognitive Therapy of Anxiety Disorders by Laurence Slate). ?Objective ?Learn and implement calming skills to reduce overall anxiety and manage anxiety symptoms. ?Target Date: 2022-04-01 Frequency: Weekly  ?Progress: 80 Modality: individual  ?Related Interventions ?Teach the client calming/relaxation skills (e.g., applied relaxation, progressive muscle relaxation, cue controlled relaxation; mindful breathing; biofeedback) and how to discriminate better between relaxation and tension; teach the client how to apply these skills to his/her daily life (e.g., New Directions in Progressive Muscle Relaxation by Marcelyn Ditty, and  Hazlett-Stevens; Treating Generalized Anxiety Disorder by Rygh and Ida Rogue). ?Objective ?Learn and implement relapse prevention strategies for managing possible future anxiety symptoms. ?Target Date: 2022-04-01 Frequency: Weekly  ?Progress: 80 Modality: individual  ?Related Interventions ?Discuss with the client the distinction between a lapse and relapse, associating a lapse with an initial and reversible return of worry, anxiety symptoms, or urges to avoid, and relapse with the decision to continue the fearful and avoidant patterns. ?Identify and rehearse with the client the management of future situations or circumstances in which lapses could occur. ?Instruct the client to routinely use new therapeutic skills (e.g., relaxation, cognitive restructuring, exposure, and problem-solving) in daily life to address emergent worries, anxiety, and avoidant tendencies. ?Develop a "coping card" on which coping strategies and other important information (e.g., "Breathe deeply and relax," "Challenge unrealistic worries," "Use problem-solving") are written for the client's later use. ?Objective ?Learn and implement a strategy to limit the association between various environmental settings and worry, delaying the worry until a designated "worry time." ?Target Date: 2022-04-01 Frequency: Weekly  ?Progress: 80 Modality: individual  ?Related Interventions ?Explain the rationale for using a worry time as well as how it is to be used; agree upon and implement a worry time with the client. ?Teach the client how to recognize, stop, and postpone worry to the agreed upon worry time using skills such as thought stopping, relaxation, and redirecting attention (or assign "Making Use of the Thought-Stopping Technique" and/or "Worry Time" in the Adult Psychotherapy Homework Planner by Jongsma to assist skill development); encourage use in daily life; review and reinforce success while providing corrective feedback toward  improvement. ?Objective ?Utilize a paradoxical intervention technique to reduce the anxiety response. ?Target Date: 2022-04-01 Frequency: Weekly  ?Progress: 60  Modality: individual  ?Related Interventions ?Develop a paradoxical

## 2021-07-30 DIAGNOSIS — Z0289 Encounter for other administrative examinations: Secondary | ICD-10-CM

## 2021-08-10 ENCOUNTER — Ambulatory Visit (INDEPENDENT_AMBULATORY_CARE_PROVIDER_SITE_OTHER): Payer: No Typology Code available for payment source | Admitting: Professional

## 2021-08-10 ENCOUNTER — Encounter: Payer: Self-pay | Admitting: Professional

## 2021-08-10 DIAGNOSIS — F411 Generalized anxiety disorder: Secondary | ICD-10-CM | POA: Diagnosis not present

## 2021-08-10 DIAGNOSIS — F331 Major depressive disorder, recurrent, moderate: Secondary | ICD-10-CM

## 2021-08-10 NOTE — Progress Notes (Signed)
[place] Counselor/Therapist Progress Note  Patient ID: Theresa Beard, MRN: [id number],    Date: 08/10/2021  Time Spent: 45 minutes 1201-1246pm  Risk Assessment: Danger to Self:  No Self-injurious Behavior: No Danger to Others: No  Subjective: This session was held via video teletherapy The patient consented to video teletherapy and was located at her home during this session. She is aware it is the responsibility of the patient to secure confidentiality on her end of the session. The provider was in a private home office for the duration of this session.   The patient arrived on time for her webex appointment.  Issues addressed: 1-homework-completed -complete activity menu assignment 2-anxiety -her anxiety has improved but over last week or two has had more -she has been more active   -is paying her bills, not procrastinating   -doing things socially   -regular day to day has been well 3-professional -feels she is getting to the place where she goes back to work -she will be taking tomorrow with someone about whether PRN or PT might be best for her to have those benefits -more than likely she will not have to work out her two weeks -she told her heavenly father that she will do whatever she needs to do   -she does not want to return to pt care 4-social supports -are all doing well -she is being engaged a lot more   -does not get stuck in her thoughts and feelings and trying to encourage and support other people  Treatment Plan Problems Addressed Anxiety, Eating Disorders And Obesity, Unipolar Depression, Vocational Stress Goals 1. Alleviate depressive symptoms and return to previous level of effective functioning. 2. Appropriately grieve the loss in order to normalize mood and to return to previously adaptive level of functioning. 3. Develop healthy thinking patterns and beliefs about self, others, and the world that lead to the alleviation and help  prevent the relapse of depression. 4. Improve satisfaction and comfort surrounding coworker relationships. Objective Identify and replace distorted cognitive messages associated with feelings of job stress. Target Date: 2022-04-01 Frequency: Weekly  Progress: 60 Modality: individual  Related Interventions Probe and clarify the client's emotions surrounding his/her vocational stress. Assess the client's distorted cognitive messages and schema that foster his/her vocational stress; replace these messages with positive cognitions (or assign "Negative Thoughts Trigger Negative Feelings" in the Adult Psychotherapy Homework Planner by Bryn Gulling). Confront the client's pattern of catastrophizing situations leading to immobilizing anxiety; replace these messages with realistic thoughts. Objective Develop and verbalize a plan for constructive action to reduce vocational stress. Target Date: 2022-04-01 Frequency: Weekly  Progress: 70 Modality: individual  Related Interventions Assist the client in developing a plan to react positively to his/her vocational situation (or assign "My Vocational Action Plan" in the Adult Psychotherapy Homework Planner by [facility]); process the proactive plan and assist in its implementation. 5. Increase job satisfaction and performance due to implementation of assertiveness and stress management strategies. 6. Increase sense of confidence and competence in dealing with work responsibilities. 7. Learn and implement coping skills that result in a reduction of anxiety and worry, and improved daily functioning. Objective Verbalize an understanding of the role that cognitive biases play in excessive irrational worry and persistent anxiety symptoms. Target Date: 2022-04-01 Frequency: Weekly  Progress: 70 Modality: individual  Related Interventions Discuss examples demonstrating that unrealistic worry typically overestimates the probability of threats and underestimates or overlooks  the client's ability to manage realistic demands (or assign "Past Successful Anxiety Coping"  in the Adult Psychotherapy Homework Planner by Bryn Gulling). Assist the client in analyzing his/her worries by examining potential biases such as the probability of the negative expectation occurring, the real consequences of it occurring, his/her ability to control the outcome, the worst possible outcome, and his/her ability to accept it (see "Analyze the Probability of a Feared Event" in the Adult Psychotherapy Homework Planner by Bryn Gulling; Cognitive Therapy of Anxiety Disorders by Alison Stalling). Objective Learn and implement calming skills to reduce overall anxiety and manage anxiety symptoms. Target Date: 2022-04-01 Frequency: Weekly  Progress: 80 Modality: individual  Related Interventions Teach the client calming/relaxation skills (e.g., applied relaxation, progressive muscle relaxation, cue controlled relaxation; mindful breathing; biofeedback) and how to discriminate better between relaxation and tension; teach the client how to apply these skills to his/her daily life (e.g., New Directions in Progressive Muscle Relaxation by Casper Harrison, and Hazlett-Stevens; Treating Generalized Anxiety Disorder by Rygh and Amparo Bristol). Objective Learn and implement relapse prevention strategies for managing possible future anxiety symptoms. Target Date: 2022-04-01 Frequency: Weekly  Progress: 80 Modality: individual  Related Interventions Discuss with the client the distinction between a lapse and relapse, associating a lapse with an initial and reversible return of worry, anxiety symptoms, or urges to avoid, and relapse with the decision to continue the fearful and avoidant patterns. Identify and rehearse with the client the management of future situations or circumstances in which lapses could occur. Instruct the client to routinely use new therapeutic skills (e.g., relaxation, cognitive restructuring,  exposure, and problem-solving) in daily life to address emergent worries, anxiety, and avoidant tendencies. Develop a "coping card" on which coping strategies and other important information (e.g., "Breathe deeply and relax," "Challenge unrealistic worries," "Use problem-solving") are written for the client's later use. Objective Learn and implement a strategy to limit the association between various environmental settings and worry, delaying the worry until a designated "worry time." Target Date: 2022-04-01 Frequency: Weekly  Progress: 80 Modality: individual  Related Interventions Explain the rationale for using a worry time as well as how it is to be used; agree upon and implement a worry time with the client. Teach the client how to recognize, stop, and postpone worry to the agreed upon worry time using skills such as thought stopping, relaxation, and redirecting attention (or assign "Making Use of the Thought-Stopping Technique" and/or "Worry Time" in the Adult Psychotherapy Homework Planner by Jongsma to assist skill development); encourage use in daily life; review and reinforce success while providing corrective feedback toward improvement. Objective Utilize a paradoxical intervention technique to reduce the anxiety response. Target Date: 2022-04-01 Frequency: Weekly  Progress: 60 Modality: individual  Related Interventions Develop a paradoxical intervention (see Ordeal Therapy by Hildred Alamin) in which the client is encouraged to have the problem (e.g., anxiety) and then schedule that anxiety to occur at specific intervals each day (at a time of day/night when the client would be clearly wanting to do something else) in a specific way and for a defined length of time. Objective Identify the major life conflicts from the past and present that form the basis for present anxiety. Target Date: 2022-04-01 Frequency: Weekly  Progress: 80 Modality: individual  Related Interventions Assist the client  in becoming aware of key unresolved life conflicts and in starting to work toward their resolution. Reinforce the client's insights into the role of his/her past emotional pain and present anxiety. Ask the client to develop and process a list of key past and present life conflicts that continue to cause worry.  8. Pursue employment consistency with a reasonably hopeful and positive attitude. 9. Recognize, accept, and cope with feelings of depression. Objective Learn and implement relapse prevention skills. Target Date: 2022-04-01 Frequency: Weekly  Progress: 80 Modality: individual  Related Interventions Identify and rehearse with the client the management of future situations or circumstances in which lapses could occur. Build the client's relapse prevention skills by helping him/her identify early warning signs of relapse and rehearsing the use of skills learned during therapy to manage them. Objective Learn and implement behavioral strategies to overcome depression. Target Date: 2022-04-01 Frequency: Weekly  Progress: 80 Modality: individual  Related Interventions Engage the client in "behavioral activation," increasing his/her activity level and contact with sources of reward, while identifying processes that inhibit activation (see Behavioral Activation for Depression by Beverly Gust, Dimidjian, and Herman-Dunn; or assign "Identify and Schedule Pleasant Activities" in the Adult Psychotherapy Homework Planner by [facility]); use behavioral techniques such as instruction, rehearsal, role-playing, role reversal, as needed, to facilitate activity in the client's daily life; reinforce success. Assist the client in developing skills that increase the likelihood of deriving pleasure from behavioral activation (e.g., assertiveness skills, developing an exercise plan, less internal/more external focus, increased social involvement); reinforce success. Objective Verbalize an accurate understanding of  depression. Target Date: 2022-04-01 Frequency: Weekly  Progress: 80 Modality: individual  Related Interventions Consistent with the treatment model, discuss how cognitive, behavioral, interpersonal, and/or other factors (e.g., family history) contribute to depression. Objective Increasingly verbalize hopeful and positive statements regarding self, others, and the future. Target Date: 2022-04-01 Frequency: Weekly  Progress: 90 Modality: individual  10. Reduce overall frequency, intensity, and duration of the anxiety so that daily functioning is not impaired. 11. Resolve the core conflict that is the source of anxiety. 12. Stabilize anxiety level while increasing ability to function on a daily basis. 13. Terminate overeating and implement lifestyle changes that lead to weight loss and improved health. Objective Verbalize an understanding of relapse prevention and the distinction between a lapse and a relapse. Target Date: 2022-04-01 Frequency: Weekly  Progress: 90 Modality: individual  Related Interventions Identify with the client future situations or circumstances in which lapses could occur. Objective Learn and implement skills for managing urges to engage in unhealthy eating or weight loss practices. Target Date: 2022-04-01 Frequency: Weekly  Progress: 70 Modality: individual  Related Interventions Teach the client tailored skills to manage high-risk situations including distraction, positive self-talk, problem-solving, conflict resolution (e.g., empathy, active listening, "I messages," respectful communication, assertiveness without aggression, compromise), or other social/ communication skills; use modeling, role-playing, and behavior rehearsal to work through several current situations.  Diagnosis:Moderate episode of recurrent major depressive disorder ([place])  Generalized anxiety disorder  Plan:  -meet again on Friday, September 25, 2021 at [place], [facility]

## 2021-08-12 ENCOUNTER — Telehealth: Payer: Self-pay | Admitting: Behavioral Health

## 2021-08-12 NOTE — Telephone Encounter (Signed)
Pt called at 2:17 pm and said that at her last visit medications were increased. She said over the last two weeks her anxiety has gotten worse. The anxiety is so bad that she is nauseaous. She has an appt 6/16 but can't wait that long. Please call her at [phone]

## 2021-08-12 NOTE — Telephone Encounter (Signed)
See message from patient. Patient has been on Wellbutrin multiple times in the past. When I talked to her she was sitting on the couch saying she feels like she is about to come unglued. Nothing has change, no new stressors. She is taking medications as prescribed, takes Wellbutrin in the AM with food. She also mentions "internal trembling." She said she does feel brighter since starting Wellbutrin. Said when she first saw you she was feeling about 40% and is now feeling about 85%. Discussed that she had not been on the medication long enough to get full benefit, not sure if sx would settle down with time. She does have an appt with you next week.  Please advise.

## 2021-08-13 NOTE — Telephone Encounter (Signed)
Patient notified of recommendations. 

## 2021-08-13 NOTE — Telephone Encounter (Signed)
Please call her and have her break the Wellbutrin tablet in half and take 75 mg for 3 days and then stop. We will need to discuss other medications at appt.

## 2021-08-19 ENCOUNTER — Ambulatory Visit (INDEPENDENT_AMBULATORY_CARE_PROVIDER_SITE_OTHER): Payer: No Typology Code available for payment source | Admitting: Behavioral Health

## 2021-08-19 ENCOUNTER — Encounter: Payer: Self-pay | Admitting: Behavioral Health

## 2021-08-19 DIAGNOSIS — F411 Generalized anxiety disorder: Secondary | ICD-10-CM

## 2021-08-19 DIAGNOSIS — F410 Panic disorder [episodic paroxysmal anxiety] without agoraphobia: Secondary | ICD-10-CM

## 2021-08-19 DIAGNOSIS — F331 Major depressive disorder, recurrent, moderate: Secondary | ICD-10-CM | POA: Diagnosis not present

## 2021-08-19 DIAGNOSIS — F4323 Adjustment disorder with mixed anxiety and depressed mood: Secondary | ICD-10-CM

## 2021-08-19 DIAGNOSIS — F5105 Insomnia due to other mental disorder: Secondary | ICD-10-CM | POA: Diagnosis not present

## 2021-08-19 DIAGNOSIS — F99 Mental disorder, not otherwise specified: Secondary | ICD-10-CM

## 2021-08-19 NOTE — Progress Notes (Addendum)
[facility] Med Check  Patient ID: Theresa Beard,  MRN: [id number]  PCP: Jomarie Longs, PA-C  Date of Evaluation: 08/19/2021 Time spent:30 minutes  Chief Complaint:  Chief Complaint   Anxiety; Depression; Medication Problem; Follow-up; Medication Refill     HISTORY/CURRENT STATUS: HPI  46 year old female presents to this office for follow up and to establish care. Her sister is present during interview with her consent. Says that she was feeling better with the Wellbutrin but started to have more anxiety but not sure cause by the medication. Sister feels that she was getting better with the Wellbutrin 150 mg daily. She says that she would like to try again and increase the dose. She does sleep 7-8 hours per night and it has improved since taking the hydroxyzine at bedtime along with her other rx meds. She  would like to increase her hydroxyzine to take for anxiety instead of just bedtime. She denies mania, no psychosis, no SI/HI. She is requesting Long-Term disability documentation.    Prior psychiatric medication: Lexapro-facial and lip swelling Wellbutin-unknown   Individual Medical History/ Review of Systems: Changes? :No   Allergies: Lexapro [escitalopram], Metformin and related, and Lisinopril  Current Medications:  Current Outpatient Medications:    atorvastatin (LIPITOR) 20 MG tablet, Take 1 tablet (20 mg total) by mouth daily., Disp: 90 tablet, Rfl: 3   buPROPion (WELLBUTRIN XL) 150 MG 24 hr tablet, Take 1 tablet (150 mg total) by mouth daily., Disp: 30 tablet, Rfl: 1   Dulaglutide 1.5 MG/0.5ML SOPN, Inject 1.5 mg into the skin once a week., Disp: 6 mL, Rfl: 1   esomeprazole (NEXIUM) 40 MG capsule, Take 1 capsule (40 mg total) by mouth daily., Disp: 90 capsule, Rfl: 3   hydrOXYzine (ATARAX) 10 MG tablet, Take 1 tablet (10 mg total) by mouth 3 (three) times daily as needed for anxiety., Disp: 90 tablet, Rfl: 0   losartan (COZAAR) 100 MG tablet, Take 1 tablet  (100 mg total) by mouth daily., Disp: 90 tablet, Rfl: 1   sertraline (ZOLOFT) 100 MG tablet, Take 1 & 1/2 tablet (150 mg total) by mouth daily., Disp: 45 tablet, Rfl: 2   sertraline (ZOLOFT) 100 MG tablet, Take 2 tablets (200 mg total) by mouth daily., Disp: 60 tablet, Rfl: 2   Suvorexant (BELSOMRA) 20 MG TABS, Take 1 tablet (20 mg) by mouth at bedtime., Disp: 30 tablet, Rfl: 1   traZODone (DESYREL) 50 MG tablet, Take 1/2 -1 tablet (25-50 mg total) by mouth at bedtime as needed for sleep., Disp: 30 tablet, Rfl: 2 Medication Side Effects: none  Family Medical/ Social History: Changes? No  MENTAL HEALTH EXAM:  There were no vitals taken for this visit.There is no height or weight on file to calculate BMI.  General Appearance: Casual, Neat, and Well Groomed  Eye Contact:  Good  Speech:  Clear and Coherent  Volume:  Normal  Mood:  Anxious and Depressed  Affect:  Congruent, Depressed, and Anxious  Thought Process:  Coherent  Orientation:  Full (Time, Place, and Person)  Thought Content: Logical   Suicidal Thoughts:  No  Homicidal Thoughts:  No  Memory:  WNL  Judgement:  Good  Insight:  Good  Psychomotor Activity:  Normal  Concentration:  Concentration: Good  Recall:  Good  Fund of Knowledge: Good  Language: Good  Assets:  Desire for Improvement  ADL's:  Intact  Cognition: WNL  Prognosis:  Good    DIAGNOSES: No diagnosis found.  Receiving Psychotherapy: Yes  RECOMMENDATIONS:   Greater than 50% of  30 min face to face time with patient was spent on counseling and coordination of care. We discussed her recently moderate improvement with anxiety and depression but she reduced her Wellbutrin and feels like some of her depression has returned. She is having lack of motivation and wanting to not get out of bed. I do not feel that she is ready to return to working full time this visit and will continue monitoring as appropriate  She has paper work for her insurance company for long  term disability she will be sending for completion.  To continue Zoloft 200 mg daily To increase Wellbutrin back to 150 mg XL daily Continue  Belsomra 20 mg daily at bedtime for sleep.  To continue Hydroxyzine 10 mg three times daily and at bedtime as needed.  To continue Trazodone 50 mg at bedtime daily Will report worsening symptoms or side effects To follow up in 6 weeks to reassess  No BC, not sexually active Reviewed PDMP   Joan Flores, NP

## 2021-08-20 ENCOUNTER — Other Ambulatory Visit (HOSPITAL_BASED_OUTPATIENT_CLINIC_OR_DEPARTMENT_OTHER): Payer: Self-pay

## 2021-08-27 ENCOUNTER — Other Ambulatory Visit (HOSPITAL_BASED_OUTPATIENT_CLINIC_OR_DEPARTMENT_OTHER): Payer: Self-pay

## 2021-08-27 ENCOUNTER — Ambulatory Visit (INDEPENDENT_AMBULATORY_CARE_PROVIDER_SITE_OTHER): Payer: No Typology Code available for payment source | Admitting: Physician Assistant

## 2021-08-27 ENCOUNTER — Encounter: Payer: Self-pay | Admitting: Physician Assistant

## 2021-08-27 VITALS — BP 130/69 | HR 81 | Ht 65.0 in | Wt 267.0 lb

## 2021-08-27 DIAGNOSIS — E1169 Type 2 diabetes mellitus with other specified complication: Secondary | ICD-10-CM | POA: Diagnosis not present

## 2021-08-27 DIAGNOSIS — F430 Acute stress reaction: Secondary | ICD-10-CM

## 2021-08-27 DIAGNOSIS — I10 Essential (primary) hypertension: Secondary | ICD-10-CM

## 2021-08-27 DIAGNOSIS — M7712 Lateral epicondylitis, left elbow: Secondary | ICD-10-CM

## 2021-08-27 DIAGNOSIS — F4323 Adjustment disorder with mixed anxiety and depressed mood: Secondary | ICD-10-CM

## 2021-08-27 DIAGNOSIS — E785 Hyperlipidemia, unspecified: Secondary | ICD-10-CM

## 2021-08-27 DIAGNOSIS — K219 Gastro-esophageal reflux disease without esophagitis: Secondary | ICD-10-CM

## 2021-08-27 DIAGNOSIS — Z6841 Body Mass Index (BMI) 40.0 and over, adult: Secondary | ICD-10-CM

## 2021-08-27 LAB — POCT GLYCOSYLATED HEMOGLOBIN (HGB A1C): HbA1c, POC (controlled diabetic range): 5.9 % (ref 0.0–7.0)

## 2021-08-27 MED ORDER — ATORVASTATIN CALCIUM 20 MG PO TABS
20.0000 mg | ORAL_TABLET | Freq: Every day | ORAL | 3 refills | Status: DC
  Filled 2021-08-27: qty 90, 90d supply, fill #0

## 2021-08-27 MED ORDER — DULAGLUTIDE 1.5 MG/0.5ML ~~LOC~~ SOAJ
1.5000 mg | SUBCUTANEOUS | 1 refills | Status: DC
  Filled 2021-08-27: qty 6, 84d supply, fill #0
  Filled 2021-11-25: qty 6, 84d supply, fill #1

## 2021-08-27 MED ORDER — ESOMEPRAZOLE MAGNESIUM 40 MG PO CPDR
40.0000 mg | DELAYED_RELEASE_CAPSULE | Freq: Every day | ORAL | 3 refills | Status: DC
  Filled 2021-08-27 – 2022-01-06 (×2): qty 90, 90d supply, fill #0
  Filled 2022-03-03 – 2022-03-29 (×2): qty 90, 90d supply, fill #1

## 2021-08-27 MED ORDER — LOSARTAN POTASSIUM 100 MG PO TABS
100.0000 mg | ORAL_TABLET | Freq: Every day | ORAL | 1 refills | Status: DC
  Filled 2021-08-27: qty 90, 90d supply, fill #0

## 2021-08-27 NOTE — Telephone Encounter (Signed)
Have you seen this paperwork?

## 2021-08-27 NOTE — Progress Notes (Unsigned)
Established Patient Office Visit  Subjective   Patient ID: Theresa Beard, female    DOB: 1975-06-05  Age: 46 y.o. MRN: [id number]  Chief Complaint  Patient presents with   Follow-up    HPI Pt is a 46 yo obese female with HTN, T2DM, GAD, Depression who presents to the clinic for 3 month follow up.   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   Cervical degenerative disc disease 11/05/2011   Gastroesophageal reflux disease without esophagitis 01/11/2012   Chondromalacia of left patellofemoral joint 03/27/2012   Generalized anxiety disorder 11/05/2013   Microcytic anemia 11/07/2013   Vitamin D insufficiency 11/07/2013   Trouble in sleeping 01/27/2015   Diabetes mellitus (HCC) 01/27/2015   Class 3 severe obesity due to excess calories with serious comorbidity and body mass index (BMI) of 45.0 to 49.9 in adult (HCC) 07/11/2015   Stress at work 04/06/2017   Irritable 04/06/2017   Acquired skin tag 09/01/2017   Tendinitis of left triceps 09/02/2017   Plantar fasciitis of left foot 09/02/2017   Elevated fasting glucose 05/14/2019   Medication side effect, initial encounter 11/21/2019   Hyperlipidemia LDL goal <70 05/02/2020   Iron deficiency anemia secondary to inadequate dietary iron intake 05/02/2020   Panic attacks 03/16/2021   Acute reaction to stress 03/16/2021   Adjustment disorder with mixed anxiety and depressed mood 03/26/2021   Allergic reaction 04/03/2021   Moderate episode of recurrent major depressive disorder (HCC) 05/14/2021   Resolved Ambulatory Problems    Diagnosis Date Noted   CHOLECYSTITIS - ACUTE 12/11/2009   ABDOMINAL PAIN, ACUTE 12/11/2009   Peroneal tendinitis of left lower extremity 11/23/2011   Obesity 11/23/2011   Abnormality of gait 12/14/2011   Influenza-like illness 04/21/2012   Subacromial bursitis 03/16/2013   Skin lesion 04/10/2013    Pre-diabetes 04/06/2015   Pain of left thumb 07/28/2015   Medial epicondylitis, left 09/01/2017   No Additional Past Medical History       ROS See HPI.    Objective:     BP 130/69   Pulse 81   Ht 5\' 5"  (1.651 m)   Wt 267 lb (121.1 kg)   SpO2 99%   BMI 44.43 kg/m  BP Readings from Last 3 Encounters:  08/27/21 130/69  05/13/21 131/90  04/17/21 (!) 142/79   Wt Readings from Last 3 Encounters:  08/27/21 267 lb (121.1 kg)  05/13/21 265 lb (120.2 kg)  04/17/21 265 lb (120.2 kg)    ..    06/19/2021   12:09 PM 06/04/2021    8:41 AM 05/28/2021    3:26 PM 05/21/2021    3:13 PM 05/14/2021    1:16 PM  Depression screen PHQ 2/9  Decreased Interest 1 1 2 3 2   Down, Depressed, Hopeless 0 0 1 2 1   PHQ - 2 Score 1 1 3 5 3   Altered sleeping 1 3 2 1 1   Tired, decreased energy 3 1 3 2 1   Change in appetite 1 1 1 1 1   Feeling bad or failure about yourself  0 0 0 1 0  Trouble concentrating 0 0 0 0 0  Moving slowly or fidgety/restless 1 0 0 0 0  Suicidal thoughts 0 0 0 0 0  PHQ-9 Score 7 6 9 10 6   Difficult doing work/chores Very difficult Somewhat difficult Very difficult Very difficult Somewhat difficult   .4/9  06/19/2021   12:11 PM 06/04/2021    8:39 AM 05/28/2021    3:24 PM 05/21/2021    3:09 PM  GAD 7 : Generalized Anxiety Score  Nervous, Anxious, on Edge 1 1 1 2   Control/stop worrying 1 1 1 2   Worry too much - different things 1 1 1 1   Trouble relaxing 1 1 1 2   Restless 0 0 1 1  Easily annoyed or irritable 1 1 1 2   Afraid - awful might happen 1 0 1 0  Total GAD 7 Score 6 5 7 10   Anxiety Difficulty Very difficult Very difficult Somewhat difficult Somewhat difficult      Physical Exam   .. Results for orders placed or performed in visit on 08/27/21  POCT glycosylated hemoglobin (Hb A1C)  Result Value Ref Range   Hemoglobin A1C     HbA1c POC (<> result, manual entry)     HbA1c, POC (prediabetic range)     HbA1c, POC (controlled diabetic range) 5.9 0.0 - 7.0  %       Assessment & Plan:  .Marland KitchenJitruttana was seen today for follow-up.  Diagnoses and all orders for this visit:  Type 2 diabetes mellitus with other specified complication, without long-term current use of insulin (HCC) -     POCT glycosylated hemoglobin (Hb A1C) -     Dulaglutide 1.5 MG/0.5ML SOPN; Inject 1.5 mg into the skin once a week.  HYPERTENSION, MILD -     losartan (COZAAR) 100 MG tablet; Take 1 tablet (100 mg total) by mouth daily.  Gastroesophageal reflux disease without esophagitis -     esomeprazole (NEXIUM) 40 MG capsule; Take 1 capsule (40 mg total) by mouth daily.  Adjustment disorder with mixed anxiety and depressed mood  Acute reaction to stress  Class 3 severe obesity due to excess calories with serious comorbidity and body mass index (BMI) of 45.0 to 49.9 in adult (HCC)  Hyperlipidemia LDL goal <70 -     atorvastatin (LIPITOR) 20 MG tablet; Take 1 tablet (20 mg total) by mouth daily.  A1C looks great Continue on same medications BP to goal, on ARB On STATIN .Marland Kitchen Diabetic Foot Exam - Simple   Simple Foot Form Diabetic Foot exam was performed with the following findings: Yes 08/27/2021  4:19 PM  Visual Inspection No deformities, no ulcerations, no other skin breakdown bilaterally: Yes Sensation Testing Intact to touch and monofilament testing bilaterally: Yes Pulse Check Posterior Tibialis and Dorsalis pulse intact bilaterally: Yes Comments    Eye exam UTD Covid vaccine x2 Pneumonia and flu UTD Follow up in 3 months  Left tennis elbow  Gave exercises  RECOMMENDATIONS:    Greater than 50% of  30 min face to face time with patient was spent on counseling and coordination of care. We discussed her recently moderate improvement with anxiety and depression but she reduced her Wellbutrin and feels like some of her depression has returned. She is having lack of motivation and wanting to not get out of bed. She has paper work for her [employer]  for long term disability she will be sending for completion.  To continue Zoloft 200 mg daily To increase Wellbutrin back to 150 mg XL daily Continue  Belsomra 20 mg daily at bedtime for sleep.  To continue Hydroxyzine 10 mg three times daily and at bedtime as needed.  To continue Trazodone 50 mg at bedtime daily Will report worsening symptoms or side effects To follow up in 6 weeks to reassess  No BC, not sexually active Reviewed PDMP   Joan Flores, NP         Completed letter for patient to go back to work for 16 or less a week. This would be a new job that the patient is excited about and thinks she will be good at    [employer], PA-C

## 2021-08-28 ENCOUNTER — Telehealth: Payer: Self-pay | Admitting: Neurology

## 2021-09-02 NOTE — Telephone Encounter (Signed)
Completed note faxed to [facility].

## 2021-09-16 ENCOUNTER — Other Ambulatory Visit (HOSPITAL_BASED_OUTPATIENT_CLINIC_OR_DEPARTMENT_OTHER): Payer: Self-pay

## 2021-09-16 ENCOUNTER — Encounter: Payer: Self-pay | Admitting: Behavioral Health

## 2021-09-16 ENCOUNTER — Ambulatory Visit (INDEPENDENT_AMBULATORY_CARE_PROVIDER_SITE_OTHER): Payer: No Typology Code available for payment source | Admitting: Behavioral Health

## 2021-09-16 DIAGNOSIS — F4323 Adjustment disorder with mixed anxiety and depressed mood: Secondary | ICD-10-CM

## 2021-09-16 DIAGNOSIS — F5105 Insomnia due to other mental disorder: Secondary | ICD-10-CM

## 2021-09-16 DIAGNOSIS — F411 Generalized anxiety disorder: Secondary | ICD-10-CM

## 2021-09-16 DIAGNOSIS — F410 Panic disorder [episodic paroxysmal anxiety] without agoraphobia: Secondary | ICD-10-CM

## 2021-09-16 DIAGNOSIS — F99 Mental disorder, not otherwise specified: Secondary | ICD-10-CM

## 2021-09-16 DIAGNOSIS — F331 Major depressive disorder, recurrent, moderate: Secondary | ICD-10-CM

## 2021-09-16 DIAGNOSIS — Z566 Other physical and mental strain related to work: Secondary | ICD-10-CM

## 2021-09-16 MED ORDER — SERTRALINE HCL 100 MG PO TABS
200.0000 mg | ORAL_TABLET | Freq: Every day | ORAL | 3 refills | Status: DC
  Filled 2021-09-16: qty 60, 30d supply, fill #0
  Filled 2022-01-25: qty 60, 30d supply, fill #1
  Filled 2022-03-03 (×2): qty 60, 30d supply, fill #2
  Filled 2022-03-29: qty 60, 30d supply, fill #3

## 2021-09-16 MED ORDER — HYDROXYZINE HCL 10 MG PO TABS
10.0000 mg | ORAL_TABLET | Freq: Three times a day (TID) | ORAL | 0 refills | Status: DC | PRN
  Filled 2021-09-16: qty 90, 30d supply, fill #0

## 2021-09-16 MED ORDER — BUPROPION HCL ER (XL) 150 MG PO TB24
150.0000 mg | ORAL_TABLET | Freq: Every day | ORAL | 3 refills | Status: DC
  Filled 2021-09-16: qty 30, 30d supply, fill #0
  Filled 2022-01-25: qty 30, 30d supply, fill #1
  Filled 2022-03-03 (×2): qty 30, 30d supply, fill #2
  Filled 2022-03-29: qty 30, 30d supply, fill #3

## 2021-09-16 MED ORDER — BELSOMRA 20 MG PO TABS
20.0000 mg | ORAL_TABLET | Freq: Every day | ORAL | 1 refills | Status: DC
  Filled 2021-09-16: qty 30, 30d supply, fill #0

## 2021-09-16 NOTE — Progress Notes (Addendum)
[facility]  Patient ID: Theresa Beard,  MRN: [id number]  PCP: Jomarie Longs, PA-C  Date of Evaluation: 09/16/2021 Time spent:30 minutes  Chief Complaint:  Chief Complaint   Anxiety; Depression; Follow-up; Medication Refill     HISTORY/CURRENT STATUS: HPI  "Theresa Beard", pronounced "Nahna", 46 year old female presents to this office for follow up and to establish care. Pt is smiling and says, "I'm feeling good". She says that she recently obtained a part time job back with [employer] and is due to start on August 8th.  She would like to continue to monitor but stay on current medication regimen. She is socializing and getting out more. She does sleep 7-8 hours per night and it has improved since taking the hydroxyzine at bedtime along with her other rx meds. She denies mania, no psychosis, no SI/HI.    Prior psychiatric medication:  Lexapro-facial and lip swelling Wellbutrin  Individual Medical History/ Review of Systems: Changes? :No   Allergies: Lexapro [escitalopram], Metformin and related, and Lisinopril  Current Medications:  Current Outpatient Medications:    atorvastatin (LIPITOR) 20 MG tablet, Take 1 tablet (20 mg total) by mouth daily., Disp: 90 tablet, Rfl: 3   buPROPion (WELLBUTRIN XL) 150 MG 24 hr tablet, Take 1 tablet (150 mg total) by mouth daily., Disp: 30 tablet, Rfl: 3   Dulaglutide 1.5 MG/0.5ML SOPN, Inject 1.5 mg into the skin once a week., Disp: 6 mL, Rfl: 1   esomeprazole (NEXIUM) 40 MG capsule, Take 1 capsule (40 mg total) by mouth daily., Disp: 90 capsule, Rfl: 3   hydrOXYzine (ATARAX) 10 MG tablet, Take 1 tablet (10 mg total) by mouth 3 (three) times daily as needed for anxiety., Disp: 90 tablet, Rfl: 0   losartan (COZAAR) 100 MG tablet, Take 1 tablet (100 mg total) by mouth daily., Disp: 90 tablet, Rfl: 1   sertraline (ZOLOFT) 100 MG tablet, Take 2 tablets (200 mg total) by mouth daily., Disp: 60 tablet, Rfl: 3   Suvorexant (BELSOMRA)  20 MG TABS, Take 1 tablet (20 mg) by mouth at bedtime., Disp: 30 tablet, Rfl: 1 Medication Side Effects: none  Family Medical/ Social History: Changes? No  MENTAL HEALTH EXAM:  There were no vitals taken for this visit.There is no height or weight on file to calculate BMI.  General Appearance: Casual and Neat  Eye Contact:  Good  Speech:  Clear and Coherent  Volume:  Normal  Mood:  NA  Affect:  Appropriate  Thought Process:  Coherent  Orientation:  Full (Time, Place, and Person)  Thought Content: Logical   Suicidal Thoughts:  No  Homicidal Thoughts:  No  Memory:  WNL  Judgement:  Good  Insight:  Good  Psychomotor Activity:  Normal  Concentration:  Concentration: Good  Recall:  Good  Fund of Knowledge: Good  Language: Good  Assets:  Desire for Improvement  ADL's:  Intact  Cognition: WNL  Prognosis:  Good    DIAGNOSES:    ICD-10-CM   1. Moderate episode of recurrent major depressive disorder (HCC)  F33.1 buPROPion (WELLBUTRIN XL) 150 MG 24 hr tablet    sertraline (ZOLOFT) 100 MG tablet    2. Generalized anxiety disorder  F41.1 buPROPion (WELLBUTRIN XL) 150 MG 24 hr tablet    sertraline (ZOLOFT) 100 MG tablet    3. Adjustment disorder with mixed anxiety and depressed mood  F43.23 buPROPion (WELLBUTRIN XL) 150 MG 24 hr tablet    sertraline (ZOLOFT) 100 MG tablet  4. Panic attacks  F41.0 sertraline (ZOLOFT) 100 MG tablet    5. Anxiety state  F41.1 hydrOXYzine (ATARAX) 10 MG tablet    6. Stress at work  Z56.6 hydrOXYzine (ATARAX) 10 MG tablet    7. Insomnia due to other mental disorder  F51.05 Suvorexant (BELSOMRA) 20 MG TABS   F99       Receiving Psychotherapy: No    RECOMMENDATIONS:   Greater than 50% of  30 min face to face time with patient was spent on counseling and coordination of care. We discussed her significant improvement with anxiety and depression this visit but I do not feel that she is ready to return to working full-time.I do recommend that she  is able to work on a limited part- time basis until full status is appropriate. She has requested documentation be completed for insurance purposes.   To continue Zoloft 200 mg daily To continue Wellbutrin back to 150 mg XL daily Continue  Belsomra 20 mg daily at bedtime for sleep.  To continue Hydroxyzine 10 mg three times daily and at bedtime as needed.  To continue Trazodone 50 mg at bedtime daily Will report worsening symptoms or side effects To follow up in 6 weeks to reassess  No BC, not sexually active Reviewed PDMP   Joan Flores, NP

## 2021-09-24 ENCOUNTER — Telehealth: Payer: Self-pay | Admitting: Neurology

## 2021-09-24 NOTE — Telephone Encounter (Signed)
Received request from [facility] for RTW date. Sent last letter with RTW details and last office note to [phone] with confirmation received.

## 2021-09-25 ENCOUNTER — Ambulatory Visit: Payer: Self-pay | Admitting: Professional

## 2021-10-05 DIAGNOSIS — Z0289 Encounter for other administrative examinations: Secondary | ICD-10-CM

## 2021-10-07 ENCOUNTER — Encounter: Payer: Self-pay | Admitting: Neurology

## 2021-10-30 ENCOUNTER — Encounter: Payer: Self-pay | Admitting: Physician Assistant

## 2021-11-03 ENCOUNTER — Other Ambulatory Visit (HOSPITAL_COMMUNITY): Payer: Self-pay

## 2021-11-03 MED ORDER — AMOXICILLIN-POT CLAVULANATE 500-125 MG PO TABS
1.0000 | ORAL_TABLET | Freq: Two times a day (BID) | ORAL | 0 refills | Status: DC
  Filled 2021-11-03: qty 20, 10d supply, fill #0

## 2021-11-25 ENCOUNTER — Ambulatory Visit (INDEPENDENT_AMBULATORY_CARE_PROVIDER_SITE_OTHER): Payer: No Typology Code available for payment source | Admitting: Behavioral Health

## 2021-11-25 ENCOUNTER — Other Ambulatory Visit (HOSPITAL_BASED_OUTPATIENT_CLINIC_OR_DEPARTMENT_OTHER): Payer: Self-pay

## 2021-11-25 ENCOUNTER — Encounter: Payer: Self-pay | Admitting: Behavioral Health

## 2021-11-25 DIAGNOSIS — F3341 Major depressive disorder, recurrent, in partial remission: Secondary | ICD-10-CM | POA: Diagnosis not present

## 2021-11-25 DIAGNOSIS — F411 Generalized anxiety disorder: Secondary | ICD-10-CM | POA: Diagnosis not present

## 2021-11-25 NOTE — Progress Notes (Signed)
[facility]  Patient ID: Theresa Beard,  MRN: [id number]  PCP: Donella Stade, PA-C  Date of Evaluation: 11/25/2021 Time spent:30 minutes  Chief Complaint:  Chief Complaint   Anxiety; Depression; Follow-up; Medication Refill; Patient Education     HISTORY/CURRENT STATUS: HPI    "Theresa Beard", pronounced "Nahna", 46 year old female presents to this office for follow up and to establish care. She is dressed very neat and well groomed. She is pleasant and smiling. Reports good progress with depression and anxiety and is happy that she has returned to work on limited basis. She is requesting extending her allow hours at this time to 20 total hours per week.  She would like to continue to monitor but stay on current medication regimen. She is socializing and getting out more. She does sleep 7-8 hours per night and it has improved since taking the hydroxyzine at bedtime along with her other rx meds. She denies mania, no psychosis, no SI/HI.    Prior psychiatric medication:   Lexapro-facial and lip swelling Wellbutrin     Individual Medical History/ Review of Systems: Changes? :No   Allergies: Lexapro [escitalopram], Metformin and related, and Lisinopril  Current Medications:  Current Outpatient Medications:    amoxicillin-clavulanate (AUGMENTIN) 500-125 MG tablet, Take 1 tablet (500 mg total) by mouth 2 (two) times daily., Disp: 20 tablet, Rfl: 0   atorvastatin (LIPITOR) 20 MG tablet, Take 1 tablet (20 mg total) by mouth daily., Disp: 90 tablet, Rfl: 3   buPROPion (WELLBUTRIN XL) 150 MG 24 hr tablet, Take 1 tablet (150 mg total) by mouth daily., Disp: 30 tablet, Rfl: 3   Dulaglutide 1.5 MG/0.5ML SOPN, Inject 1.5 mg into the skin once a week., Disp: 6 mL, Rfl: 1   esomeprazole (NEXIUM) 40 MG capsule, Take 1 capsule (40 mg total) by mouth daily., Disp: 90 capsule, Rfl: 3   hydrOXYzine (ATARAX) 10 MG tablet, Take 1 tablet (10 mg total) by mouth 3 (three) times daily as  needed for anxiety., Disp: 90 tablet, Rfl: 0   losartan (COZAAR) 100 MG tablet, Take 1 tablet (100 mg total) by mouth daily., Disp: 90 tablet, Rfl: 1   sertraline (ZOLOFT) 100 MG tablet, Take 2 tablets (200 mg total) by mouth daily., Disp: 60 tablet, Rfl: 3   Suvorexant (BELSOMRA) 20 MG TABS, Take 1 tablet (20 mg) by mouth at bedtime., Disp: 30 tablet, Rfl: 1 Medication Side Effects: none  Family Medical/ Social History: Changes? No  MENTAL HEALTH EXAM:  There were no vitals taken for this visit.There is no height or weight on file to calculate BMI.  General Appearance: Casual and Well Groomed  Eye Contact:  Good  Speech:  Clear and Coherent  Volume:  Normal  Mood:  NA  Affect:  Appropriate  Thought Process:  Coherent  Orientation:  Full (Time, Place, and Person)  Thought Content: Logical   Suicidal Thoughts:  No  Homicidal Thoughts:  No  Memory:  WNL  Judgement:  Good  Insight:  Good  Psychomotor Activity:  Normal  Concentration:  Concentration: Good  Recall:  Good  Fund of Knowledge: Good  Language: Good  Assets:  Desire for Improvement  ADL's:  Intact  Cognition: WNL  Prognosis:  Good    DIAGNOSES: No diagnosis found.  Receiving Psychotherapy: No    RECOMMENDATIONS:   Greater than 50% of  30 min face to face time with patient was spent on counseling and coordination of care. We discussed her significant improvement with  anxiety and depression this visit. She is reporting feeling much better and I encourage gradual return to normal working hours. I am recommending patient is allow to increase her part time hours to 20 total hours per week. She has requested documentation be completed for insurance purposes. She says that it has been faxed to this office from [place] [facility].     To continue Zoloft 200 mg daily To continue Wellbutrin back to 150 mg XL daily Continue  Belsomra 20 mg daily at bedtime for sleep.  To continue Hydroxyzine 10 mg three times  daily and at bedtime as needed.  To continue Trazodone 50 mg at bedtime daily Will report worsening symptoms or side effects To follow up in 3 months to reassess  No BC, not sexually active Reviewed PDMP   Joan Flores, NP

## 2021-12-03 DIAGNOSIS — Z0289 Encounter for other administrative examinations: Secondary | ICD-10-CM

## 2022-01-01 ENCOUNTER — Ambulatory Visit (INDEPENDENT_AMBULATORY_CARE_PROVIDER_SITE_OTHER): Payer: No Typology Code available for payment source | Admitting: Physician Assistant

## 2022-01-01 ENCOUNTER — Encounter: Payer: Self-pay | Admitting: Physician Assistant

## 2022-01-01 ENCOUNTER — Other Ambulatory Visit (HOSPITAL_BASED_OUTPATIENT_CLINIC_OR_DEPARTMENT_OTHER): Payer: Self-pay

## 2022-01-01 VITALS — BP 135/73 | HR 72 | Wt 271.0 lb

## 2022-01-01 DIAGNOSIS — E1169 Type 2 diabetes mellitus with other specified complication: Secondary | ICD-10-CM

## 2022-01-01 DIAGNOSIS — F331 Major depressive disorder, recurrent, moderate: Secondary | ICD-10-CM

## 2022-01-01 DIAGNOSIS — I10 Essential (primary) hypertension: Secondary | ICD-10-CM

## 2022-01-01 DIAGNOSIS — Z23 Encounter for immunization: Secondary | ICD-10-CM | POA: Diagnosis not present

## 2022-01-01 DIAGNOSIS — Z6841 Body Mass Index (BMI) 40.0 and over, adult: Secondary | ICD-10-CM

## 2022-01-01 DIAGNOSIS — F410 Panic disorder [episodic paroxysmal anxiety] without agoraphobia: Secondary | ICD-10-CM

## 2022-01-01 LAB — POCT GLYCOSYLATED HEMOGLOBIN (HGB A1C): Hemoglobin A1C: 6 % — AB (ref 4.0–5.6)

## 2022-01-01 MED ORDER — DULAGLUTIDE 1.5 MG/0.5ML ~~LOC~~ SOAJ
1.5000 mg | SUBCUTANEOUS | 1 refills | Status: DC
  Filled 2022-01-01 – 2022-03-03 (×2): qty 6, 84d supply, fill #0
  Filled 2022-03-29: qty 2, 28d supply, fill #0
  Filled 2022-04-30: qty 2, 28d supply, fill #1

## 2022-01-01 MED ORDER — FREESTYLE LIBRE 14 DAY SENSOR MISC
1.0000 | 11 refills | Status: DC
  Filled 2022-01-01: qty 2, 28d supply, fill #0
  Filled 2022-03-29: qty 2, 28d supply, fill #1

## 2022-01-01 MED ORDER — LOSARTAN POTASSIUM 100 MG PO TABS
100.0000 mg | ORAL_TABLET | Freq: Every day | ORAL | 1 refills | Status: DC
  Filled 2022-01-01: qty 90, 90d supply, fill #0
  Filled 2022-03-03 – 2022-03-29 (×2): qty 90, 90d supply, fill #1

## 2022-01-01 NOTE — Patient Instructions (Signed)
Need eye exam

## 2022-01-01 NOTE — Progress Notes (Signed)
Established Patient Office Visit  Subjective   Patient ID: Theresa Beard, female    DOB: [birth year]  Age: 46 y.o. MRN: [id number]  Chief Complaint  Patient presents with   Diabetes    HPI Pt is a 46 yo obese female with T2DM, HTN, MDD, anxiety who presents to the clinic for 3 month follow up.   Pt's mood is doing great. No concerns. She is very happy. She is slowly increasing her hours to work full time again.   She is not checking her sugars. She would like to get the freesytle libre. No hypoglycemic events. No open sores or wounds. Taking trulicity weekly without any issues. No CP, palpitations, headaches or vision changes.   She is taking her losartan. BPs at home are under 130/80.   .. 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   Cervical degenerative disc disease 11/05/2011   Gastroesophageal reflux disease without esophagitis 01/11/2012   Chondromalacia of left patellofemoral joint 03/27/2012   Generalized anxiety disorder 11/05/2013   Microcytic anemia 11/07/2013   Vitamin D insufficiency 11/07/2013   Trouble in sleeping 01/27/2015   Diabetes mellitus (HCC) 01/27/2015   Class 3 severe obesity due to excess calories with serious comorbidity and body mass index (BMI) of 45.0 to 49.9 in adult (HCC) 07/11/2015   Stress at work 04/06/2017   Irritable 04/06/2017   Acquired skin tag 09/01/2017   Tendinitis of left triceps 09/02/2017   Plantar fasciitis of left foot 09/02/2017   Elevated fasting glucose 05/14/2019   Medication side effect, initial encounter 11/21/2019   Hyperlipidemia LDL goal <70 05/02/2020   Iron deficiency anemia secondary to inadequate dietary iron intake 05/02/2020   Panic attacks 03/16/2021   Acute reaction to stress 03/16/2021   Adjustment disorder with mixed anxiety and depressed mood 03/26/2021   Allergic reaction 04/03/2021   Moderate episode of  recurrent major depressive disorder (HCC) 05/14/2021   Left lateral epicondylitis 08/27/2021   Resolved Ambulatory Problems    Diagnosis Date Noted   CHOLECYSTITIS - ACUTE 12/11/2009   ABDOMINAL PAIN, ACUTE 12/11/2009   Peroneal tendinitis of left lower extremity 11/23/2011   Obesity 11/23/2011   Abnormality of gait 12/14/2011   Influenza-like illness 04/21/2012   Subacromial bursitis 03/16/2013   Skin lesion 04/10/2013   Pre-diabetes 04/06/2015   Pain of left thumb 07/28/2015   Medial epicondylitis, left 09/01/2017   No Additional Past Medical History     ROS See HPI.    Objective:     BP 135/73   Pulse 72   Wt 271 lb (122.9 kg)   SpO2 96%   BMI 45.10 kg/m  BP Readings from Last 3 Encounters:  01/01/22 135/73  08/27/21 130/69  04/17/21 (!) 142/79   Wt Readings from Last 3 Encounters:  01/01/22 271 lb (122.9 kg)  08/27/21 267 lb (121.1 kg)  04/17/21 265 lb (120.2 kg)    .Marland Kitchen Results for orders placed or performed in visit on 01/01/22  POCT glycosylated hemoglobin (Hb A1C)  Result Value Ref Range   Hemoglobin A1C 6.0 (A) 4.0 - 5.6 %   HbA1c POC (<> result, manual entry)     HbA1c, POC (prediabetic range)     HbA1c, POC (controlled diabetic range)       Physical Exam Constitutional:      Appearance: Normal appearance. She is obese.  HENT:     Head: Normocephalic.  Cardiovascular:  Rate and Rhythm: Normal rate and regular rhythm.     Pulses: Normal pulses.  Pulmonary:     Effort: Pulmonary effort is normal.     Breath sounds: Normal breath sounds.  Musculoskeletal:     Right lower leg: No edema.     Left lower leg: No edema.  Neurological:     General: No focal deficit present.     Mental Status: She is alert and oriented to person, place, and time.  Psychiatric:        Mood and Affect: Mood normal.         Assessment & Plan:   .Marland KitchenJitruttana was seen today for diabetes.  Diagnoses and all orders for this visit:  Type 2 diabetes  mellitus with other specified complication, without long-term current use of insulin (HCC) -     POCT glycosylated hemoglobin (Hb A1C) -     Cancel: POCT UA - Microalbumin -     Dulaglutide 1.5 MG/0.5ML SOPN; Inject 1.5 mg into the skin once a week. -     Continuous Blood Gluc Sensor (FREESTYLE LIBRE 14 DAY SENSOR) MISC; 1 Application by Does not apply route every 14 (fourteen) days. Apply upper deltoid every 14 days, use reader to determine blood sugars  HYPERTENSION, MILD -     losartan (COZAAR) 100 MG tablet; Take 1 tablet (100 mg total) by mouth daily.  Need for immunization against influenza -     Flu Vaccine QUAD 71mo+IM (Fluarix, Fluzone & Alfiuria Quad PF)  Class 3 severe obesity due to excess calories with serious comorbidity and body mass index (BMI) of 45.0 to 49.9 in adult [facility])  Panic attacks  Moderate episode of recurrent major depressive disorder (HCC)  A1C to goal Stay on trulicity Work on weight loss On statin BP to goal and better on 2nd recheck, on ACE Could not urinate for microalbumin Needs eye exam Flu shot given today Will get covid at pharmacy Follow up in 3 months  Mood is great continue follow up with [facility].    Return in about 3 months (around 04/03/2022).    Iran Planas, PA-C

## 2022-01-06 ENCOUNTER — Other Ambulatory Visit (HOSPITAL_BASED_OUTPATIENT_CLINIC_OR_DEPARTMENT_OTHER): Payer: Self-pay

## 2022-01-25 ENCOUNTER — Other Ambulatory Visit (HOSPITAL_BASED_OUTPATIENT_CLINIC_OR_DEPARTMENT_OTHER): Payer: Self-pay

## 2022-02-12 ENCOUNTER — Other Ambulatory Visit (HOSPITAL_COMMUNITY): Payer: Self-pay

## 2022-02-12 ENCOUNTER — Telehealth: Payer: No Typology Code available for payment source | Admitting: Physician Assistant

## 2022-02-12 DIAGNOSIS — R110 Nausea: Secondary | ICD-10-CM | POA: Diagnosis not present

## 2022-02-12 DIAGNOSIS — B9689 Other specified bacterial agents as the cause of diseases classified elsewhere: Secondary | ICD-10-CM

## 2022-02-12 DIAGNOSIS — J019 Acute sinusitis, unspecified: Secondary | ICD-10-CM | POA: Diagnosis not present

## 2022-02-12 MED ORDER — ONDANSETRON HCL 4 MG PO TABS
4.0000 mg | ORAL_TABLET | Freq: Three times a day (TID) | ORAL | 0 refills | Status: DC | PRN
  Filled 2022-02-12: qty 20, 7d supply, fill #0

## 2022-02-12 MED ORDER — FLUTICASONE PROPIONATE 50 MCG/ACT NA SUSP
2.0000 | Freq: Every day | NASAL | 0 refills | Status: DC
  Filled 2022-02-12: qty 16, 30d supply, fill #0

## 2022-02-12 MED ORDER — AMOXICILLIN-POT CLAVULANATE 875-125 MG PO TABS
1.0000 | ORAL_TABLET | Freq: Two times a day (BID) | ORAL | 0 refills | Status: DC
  Filled 2022-02-12: qty 14, 7d supply, fill #0

## 2022-02-12 NOTE — Progress Notes (Signed)
E-Visit for Sinus Problems  We are sorry that you are not feeling well.  Here is how we plan to help!  Based on what you have shared with me it looks like you have sinusitis.  Sinusitis is inflammation and infection in the sinus cavities of the head.  Based on your presentation I believe you most likely have Acute Bacterial Sinusitis.  This is an infection caused by bacteria and is treated with antibiotics. I have prescribed Augmentin 875mg /125mg  one tablet twice daily with food, for 7 days. I have also prescribed Fluticasone nasal spray for the drainage and congestion. I have prescribed Zofran for the nausea. You may use an oral decongestant such as Mucinex D or if you have glaucoma or high blood pressure use plain Mucinex. Saline nasal spray help and can safely be used as often as needed for congestion.  If you develop worsening sinus pain, fever or notice severe headache and vision changes, or if symptoms are not better after completion of antibiotic, please schedule an appointment with a health care provider.    Sinus infections are not as easily transmitted as other respiratory infection, however we still recommend that you avoid close contact with loved ones, especially the very young and elderly.  Remember to wash your hands thoroughly throughout the day as this is the number one way to prevent the spread of infection!  Home Care: Only take medications as instructed by your medical team. Complete the entire course of an antibiotic. Do not take these medications with alcohol. A steam or ultrasonic humidifier can help congestion.  You can place a towel over your head and breathe in the steam from hot water coming from a faucet. Avoid close contacts especially the very young and the elderly. Cover your mouth when you cough or sneeze. Always remember to wash your hands.  Get Help Right Away If: You develop worsening fever or sinus pain. You develop a severe head ache or visual changes. Your  symptoms persist after you have completed your treatment plan.  Make sure you Understand these instructions. Will watch your condition. Will get help right away if you are not doing well or get worse.  Thank you for choosing an e-visit.  Your e-visit answers were reviewed by a board certified advanced clinical practitioner to complete your personal care plan. Depending upon the condition, your plan could have included both over the counter or prescription medications.  Please review your pharmacy choice. Make sure the pharmacy is open so you can pick up prescription now. If there is a problem, you may contact your provider through and have the prescription routed to another pharmacy.  Your safety is important to [employer]. If you have drug allergies check your prescription carefully.   For the next 24 hours you can use MyChart to ask questions about today's visit, request a non-urgent call back, or ask for a work or school excuse. You will get an email in the next two days asking about your experience. I hope that your e-visit has been valuable and will speed your recovery.  I have spent 5 minutes in review of e-visit questionnaire, review and updating patient chart, medical decision making and response to patient.   [place], PA-C

## 2022-02-25 ENCOUNTER — Ambulatory Visit: Payer: No Typology Code available for payment source | Admitting: Behavioral Health

## 2022-03-03 ENCOUNTER — Other Ambulatory Visit: Payer: Self-pay

## 2022-03-03 ENCOUNTER — Other Ambulatory Visit (HOSPITAL_BASED_OUTPATIENT_CLINIC_OR_DEPARTMENT_OTHER): Payer: Self-pay

## 2022-03-04 ENCOUNTER — Ambulatory Visit (INDEPENDENT_AMBULATORY_CARE_PROVIDER_SITE_OTHER): Payer: No Typology Code available for payment source | Admitting: Behavioral Health

## 2022-03-04 DIAGNOSIS — F3341 Major depressive disorder, recurrent, in partial remission: Secondary | ICD-10-CM | POA: Diagnosis not present

## 2022-03-04 DIAGNOSIS — F411 Generalized anxiety disorder: Secondary | ICD-10-CM

## 2022-03-07 ENCOUNTER — Encounter: Payer: Self-pay | Admitting: Behavioral Health

## 2022-03-07 NOTE — Progress Notes (Signed)
[facility]  Patient ID: Theresa Beard,  MRN: [id number]  PCP: Theresa Longs, PA-C  Date of Evaluation: 03/07/2022 Time spent:30 minutes  Chief Complaint:  Chief Complaint   Depression; Anxiety; Follow-up; Medication Refill; Patient Education     HISTORY/CURRENT STATUS: HPI  "Theresa Beard", pronounced "Nahna", 46 year old female presents to this office for follow up and to establish care. She is dressed very neat and well groomed. She is pleasant and smiling. Reports good progress with depression and anxiety and is still happy that she has returned to work on limited basis. She says that her new work environment is an improvement for her mental health. Although she is on the right track to recovery she feels like she is not quiet ready to return to work full time.  She would like to remain on 20 hours per week for now until next visit.   She would like to continue to monitor but stay on current medication regimen. She is socializing and getting out more. Reports her anxiety at 3/10 and depression at 2/10. She does sleep 7-8 hours per night and it has improved since taking the hydroxyzine at bedtime along with her other rx meds. She denies mania, no psychosis, no SI/HI.    Prior psychiatric medication:   Lexapro-facial and lip swelling Wellbutrin  Individual Medical History/ Review of Systems: Changes? :No   Allergies: Lexapro [escitalopram], Metformin and related, and Lisinopril  Current Medications:  Current Outpatient Medications:    amoxicillin-clavulanate (AUGMENTIN) 875-125 MG tablet, Take 1 tablet by mouth 2 (two) times daily., Disp: 14 tablet, Rfl: 0   atorvastatin (LIPITOR) 20 MG tablet, Take 1 tablet (20 mg total) by mouth daily., Disp: 90 tablet, Rfl: 3   buPROPion (WELLBUTRIN XL) 150 MG 24 hr tablet, Take 1 tablet (150 mg total) by mouth daily., Disp: 30 tablet, Rfl: 3   Continuous Blood Gluc Sensor (FREESTYLE LIBRE 14 DAY SENSOR) MISC, 1 Application by  Does not apply route every 14 (fourteen) days. Apply upper deltoid every 14 days, use reader to determine blood sugars, Disp: 2 each, Rfl: 11   Dulaglutide 1.5 MG/0.5ML SOPN, Inject 1.5 mg into the skin once a week., Disp: 6 mL, Rfl: 1   esomeprazole (NEXIUM) 40 MG capsule, Take 1 capsule (40 mg total) by mouth daily., Disp: 90 capsule, Rfl: 3   fluticasone (FLONASE) 50 MCG/ACT nasal spray, Place 2 sprays into both nostrils daily., Disp: 16 g, Rfl: 0   hydrOXYzine (ATARAX) 10 MG tablet, Take 1 tablet (10 mg total) by mouth 3 (three) times daily as needed for anxiety., Disp: 90 tablet, Rfl: 0   losartan (COZAAR) 100 MG tablet, Take 1 tablet (100 mg total) by mouth daily., Disp: 90 tablet, Rfl: 1   ondansetron (ZOFRAN) 4 MG tablet, Take 1 tablet (4 mg total) by mouth every 8 (eight) hours as needed for nausea or vomiting., Disp: 20 tablet, Rfl: 0   sertraline (ZOLOFT) 100 MG tablet, Take 2 tablets (200 mg total) by mouth daily., Disp: 60 tablet, Rfl: 3   Suvorexant (BELSOMRA) 20 MG TABS, Take 1 tablet (20 mg) by mouth at bedtime., Disp: 30 tablet, Rfl: 1 Medication Side Effects: none  Family Medical/ Social History: Changes? No  MENTAL HEALTH EXAM:  There were no vitals taken for this visit.There is no height or weight on file to calculate BMI.  General Appearance: Casual, Neat, and Well Groomed  Eye Contact:  Good  Speech:  Clear and Coherent  Volume:  Normal  Mood:  NA  Affect:  Appropriate  Thought Process:  Coherent  Orientation:  Full (Time, Place, and Person)  Thought Content: Logical   Suicidal Thoughts:  No  Homicidal Thoughts:  No  Memory:  WNL  Judgement:  Good  Insight:  Good  Psychomotor Activity:  Normal  Concentration:  Concentration: Good  Recall:  Good  Fund of Knowledge: Good  Language: Good  Assets:  Desire for Improvement  ADL's:  Intact  Cognition: WNL  Prognosis:  Good    DIAGNOSES: No diagnosis found.  Receiving Psychotherapy: No    RECOMMENDATIONS:    Greater than 50% of  30 min face to face time with patient was spent on counseling and coordination of care. We discussed her significant improvement with anxiety and depression this visit. She is reporting feeling much better and I encourage gradual return to normal working hours as she is able. She agrees that she is making good progress but feels like she is still not ready due to anxiety to return full time. I am recommending patient remain on 20 hours per week until next visit.  She will be requesting renewal of her documentation and will be sending paperwork following this visit.   To continue Zoloft 200 mg daily To continue Wellbutrin back to 150 mg XL daily Continue  Belsomra 20 mg daily at bedtime for sleep.  To continue Hydroxyzine 10 mg three times daily and at bedtime as needed.  To continue Trazodone 50 mg at bedtime daily Will report worsening symptoms or side effects To follow up in 3 months to reassess  No BC, not sexually active Reviewed PDMP   Joan Flores, NP            Joan Flores, NP

## 2022-03-18 ENCOUNTER — Telehealth: Payer: Self-pay | Admitting: Behavioral Health

## 2022-03-18 NOTE — Telephone Encounter (Signed)
Received fax from [employer] re: Theresa Beard. They need completion for FMLA form. Placed in Traci's box to complete.

## 2022-03-23 NOTE — Telephone Encounter (Signed)
Please see message - FMLA paperwork

## 2022-03-23 NOTE — Telephone Encounter (Signed)
Pt called at 1:38p asking if we had received paperwork for 2024.  I advised her that we did.  She wanted to know when it will be completed.  Pls call her back and let her know when it's sent.  Next appt 4/29

## 2022-03-26 DIAGNOSIS — Z0289 Encounter for other administrative examinations: Secondary | ICD-10-CM

## 2022-03-26 NOTE — Telephone Encounter (Signed)
Can you let her know that her paper work was completed and faxed with her last office note

## 2022-03-26 NOTE — Telephone Encounter (Signed)
Patient notified

## 2022-03-26 NOTE — Telephone Encounter (Signed)
Paper work completed, last office visit note printed. Aaron Edelman review and sign then faxed per request

## 2022-03-29 ENCOUNTER — Other Ambulatory Visit: Payer: Self-pay

## 2022-03-29 ENCOUNTER — Other Ambulatory Visit (HOSPITAL_BASED_OUTPATIENT_CLINIC_OR_DEPARTMENT_OTHER): Payer: Self-pay

## 2022-03-29 ENCOUNTER — Ambulatory Visit: Payer: No Typology Code available for payment source | Admitting: Physician Assistant

## 2022-04-06 ENCOUNTER — Other Ambulatory Visit (HOSPITAL_BASED_OUTPATIENT_CLINIC_OR_DEPARTMENT_OTHER): Payer: Self-pay

## 2022-04-07 ENCOUNTER — Ambulatory Visit: Payer: No Typology Code available for payment source | Admitting: Medical-Surgical

## 2022-04-23 ENCOUNTER — Encounter: Payer: No Typology Code available for payment source | Admitting: Medical-Surgical

## 2022-04-30 ENCOUNTER — Ambulatory Visit (INDEPENDENT_AMBULATORY_CARE_PROVIDER_SITE_OTHER): Payer: 59 | Admitting: Physician Assistant

## 2022-04-30 ENCOUNTER — Other Ambulatory Visit (HOSPITAL_BASED_OUTPATIENT_CLINIC_OR_DEPARTMENT_OTHER): Payer: Self-pay

## 2022-04-30 ENCOUNTER — Telehealth: Payer: Self-pay | Admitting: Physician Assistant

## 2022-04-30 ENCOUNTER — Encounter: Payer: Self-pay | Admitting: Physician Assistant

## 2022-04-30 VITALS — BP 146/78 | HR 82 | Ht 65.0 in | Wt 278.0 lb

## 2022-04-30 DIAGNOSIS — Z1329 Encounter for screening for other suspected endocrine disorder: Secondary | ICD-10-CM

## 2022-04-30 DIAGNOSIS — F410 Panic disorder [episodic paroxysmal anxiety] without agoraphobia: Secondary | ICD-10-CM

## 2022-04-30 DIAGNOSIS — F411 Generalized anxiety disorder: Secondary | ICD-10-CM | POA: Diagnosis not present

## 2022-04-30 DIAGNOSIS — Z0000 Encounter for general adult medical examination without abnormal findings: Secondary | ICD-10-CM | POA: Diagnosis not present

## 2022-04-30 DIAGNOSIS — E1169 Type 2 diabetes mellitus with other specified complication: Secondary | ICD-10-CM

## 2022-04-30 DIAGNOSIS — E785 Hyperlipidemia, unspecified: Secondary | ICD-10-CM

## 2022-04-30 DIAGNOSIS — E559 Vitamin D deficiency, unspecified: Secondary | ICD-10-CM | POA: Diagnosis not present

## 2022-04-30 DIAGNOSIS — Z23 Encounter for immunization: Secondary | ICD-10-CM

## 2022-04-30 DIAGNOSIS — F4323 Adjustment disorder with mixed anxiety and depressed mood: Secondary | ICD-10-CM | POA: Diagnosis not present

## 2022-04-30 DIAGNOSIS — D508 Other iron deficiency anemias: Secondary | ICD-10-CM

## 2022-04-30 DIAGNOSIS — F331 Major depressive disorder, recurrent, moderate: Secondary | ICD-10-CM | POA: Diagnosis not present

## 2022-04-30 DIAGNOSIS — I10 Essential (primary) hypertension: Secondary | ICD-10-CM

## 2022-04-30 MED ORDER — BUPROPION HCL ER (XL) 150 MG PO TB24
150.0000 mg | ORAL_TABLET | Freq: Every day | ORAL | 3 refills | Status: DC
  Filled 2022-04-30: qty 30, 30d supply, fill #0
  Filled 2022-08-08: qty 30, 30d supply, fill #1

## 2022-04-30 MED ORDER — LOSARTAN POTASSIUM 100 MG PO TABS
100.0000 mg | ORAL_TABLET | Freq: Every day | ORAL | 1 refills | Status: DC
  Filled 2022-04-30 – 2022-11-03 (×2): qty 90, 90d supply, fill #0

## 2022-04-30 MED ORDER — TIRZEPATIDE 7.5 MG/0.5ML ~~LOC~~ SOAJ
7.5000 mg | SUBCUTANEOUS | 1 refills | Status: DC
  Filled 2022-04-30: qty 2, 28d supply, fill #0
  Filled 2022-08-08: qty 2, 28d supply, fill #1
  Filled 2022-09-08: qty 2, 28d supply, fill #2
  Filled 2022-10-05: qty 2, 28d supply, fill #3
  Filled 2022-11-03: qty 2, 28d supply, fill #4
  Filled 2022-12-17: qty 2, 28d supply, fill #5

## 2022-04-30 MED ORDER — SERTRALINE HCL 100 MG PO TABS
200.0000 mg | ORAL_TABLET | Freq: Every day | ORAL | 3 refills | Status: DC
  Filled 2022-04-30: qty 60, 30d supply, fill #0

## 2022-04-30 NOTE — Telephone Encounter (Signed)
Alternative sent to pharmacy per [facility].

## 2022-04-30 NOTE — Telephone Encounter (Signed)
Patient called stated [place] is out of Trulicity and she is requesting to be prescribed something else please contact patient thank you

## 2022-04-30 NOTE — Progress Notes (Signed)
Established Patient Office Visit  Subjective   Patient ID: Theresa Beard, female    DOB: [birth year]  Age: 47 y.o. MRN: [id number]  Chief Complaint  Patient presents with   Annual Exam    HPI Patient is a 47 yo obese female with T2DM, HTN, MDD, anxiety who presents to the clinic for medication refill of Trulicity.   Her mood is still stable and happy. She is back to working part time.  She is not checking her sugars at home. She is getting the freestyle libre and will start checking them. No symptoms of hypoglycemia.  She is taking her Losartan regularly. Does not check BP at home. Denies any CP, palpitations, headaches or vision changes. She did not take her BP medications this morning.   Reports she has not been compliant with atorvastatin and forgets to take it sometimes.   No symptoms of headache, chest pain, or abdominal pain. Normal bowel movements. No urinary symptoms.    .. 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   Cervical degenerative disc disease 11/05/2011   Gastroesophageal reflux disease without esophagitis 01/11/2012   Chondromalacia of left patellofemoral joint 03/27/2012   Generalized anxiety disorder 11/05/2013   Microcytic anemia 11/07/2013   Vitamin D insufficiency 11/07/2013   Trouble in sleeping 01/27/2015   Diabetes mellitus ([place]) 01/27/2015   Class 3 severe obesity due to excess calories with serious comorbidity and body mass index (BMI) of 45.0 to 49.9 in adult ([place]) 07/11/2015   Stress at work 04/06/2017   Irritable 04/06/2017   Acquired skin tag 09/01/2017   Tendinitis of left triceps 09/02/2017   Plantar fasciitis of left foot 09/02/2017   Elevated fasting glucose 05/14/2019   Medication side effect, initial encounter 11/21/2019   Hyperlipidemia LDL goal <70 05/02/2020   Iron deficiency anemia secondary to inadequate dietary iron intake 05/02/2020    Panic attacks 03/16/2021   Acute reaction to stress 03/16/2021   Adjustment disorder with mixed anxiety and depressed mood 03/26/2021   Allergic reaction 04/03/2021   Moderate episode of recurrent major depressive disorder ([place]) 05/14/2021   Left lateral epicondylitis 08/27/2021   Resolved Ambulatory Problems    Diagnosis Date Noted   CHOLECYSTITIS - ACUTE 12/11/2009   ABDOMINAL PAIN, ACUTE 12/11/2009   Peroneal tendinitis of left lower extremity 11/23/2011   Obesity 11/23/2011   Abnormality of gait 12/14/2011   Influenza-like illness 04/21/2012   Subacromial bursitis 03/16/2013   Skin lesion 04/10/2013   Pre-diabetes 04/06/2015   Pain of left thumb 07/28/2015   Medial epicondylitis, left 09/01/2017   No Additional Past Medical History    ROS See HPI   Objective:     BP (!) 146/78 (BP Location: Left Arm, Patient Position: Sitting, Cuff Size: Large)   Pulse 82   Ht '5\' 5"'$  (1.651 m)   Wt 278 lb 0.6 oz (126.1 kg)   SpO2 96%   BMI 46.27 kg/m  BP Readings from Last 3 Encounters:  04/30/22 (!) 146/78  01/01/22 135/73  08/27/21 130/69   Wt Readings from Last 3 Encounters:  04/30/22 278 lb 0.6 oz (126.1 kg)  01/01/22 271 lb (122.9 kg)  08/27/21 267 lb (121.1 kg)    ..    04/30/2022    9:30 AM 03/07/2022    5:31 PM 06/19/2021   12:09 PM 06/04/2021    8:41 AM 05/28/2021    3:26 PM  Depression screen PHQ  2/9  Decreased Interest 0      Down, Depressed, Hopeless 0      PHQ - 2 Score 0      Altered sleeping 3      Tired, decreased energy 1      Change in appetite 0      Feeling bad or failure about yourself  0      Trouble concentrating 0      Moving slowly or fidgety/restless 0      Suicidal thoughts 0      PHQ-9 Score 4      Difficult doing work/chores Somewhat difficult         Information is confidential and restricted. Go to Review Flowsheets to unlock data.   ..    04/30/2022    9:30 AM 06/19/2021   12:11 PM 06/04/2021    8:39 AM 05/28/2021    3:24 PM   GAD 7 : Generalized Anxiety Score  Nervous, Anxious, on Edge 1     Control/stop worrying 0     Worry too much - different things 0     Trouble relaxing 0     Restless 0     Easily annoyed or irritable 0     Afraid - awful might happen 0     Total GAD 7 Score 1     Anxiety Difficulty Not difficult at all        Information is confidential and restricted. Go to Review Flowsheets to unlock data.      Physical Exam Constitutional:      Appearance: She is obese.  HENT:     Head: Normocephalic.  Cardiovascular:     Rate and Rhythm: Normal rate and regular rhythm.     Pulses: Normal pulses.  Pulmonary:     Effort: Pulmonary effort is normal.     Breath sounds: Normal breath sounds.  Musculoskeletal:        General: No swelling.  Neurological:     General: No focal deficit present.     Mental Status: She is oriented to person, place, and time.  Psychiatric:        Mood and Affect: Mood normal.       The 10-year ASCVD risk score (Arnett DK, et al., 2019) is: 3.2%    Assessment & Plan:  .Marland KitchenJitruttana was seen today for annual exam.  Diagnoses and all orders for this visit:  Type 2 diabetes mellitus with other specified complication, without long-term current use of insulin (HCC) -     CMP14+EGFR -     Hemoglobin A1c -     tirzepatide (MOUNJARO) 7.5 MG/0.5ML Pen; Inject 7.5 mg into the skin once a week.  Essential hypertension, benign -     CMP14+EGFR -     losartan (COZAAR) 100 MG tablet; Take 1 tablet (100 mg total) by mouth daily.  Hyperlipidemia LDL goal <70 -     Lipid Panel With LDL/HDL Ratio  Iron deficiency anemia secondary to inadequate dietary iron intake -     CBC with Differential/Platelet  Thyroid disorder screen -     TSH  Routine physical examination -     CBC with Differential/Platelet -     CMP14+EGFR -     Hemoglobin A1c -     Lipid Panel With LDL/HDL Ratio -     TSH  Vitamin D deficiency -     VITAMIN D 25 Hydroxy (Vit-D Deficiency,  Fractures)  Encounter for immunization -  Tdap vaccine greater than or equal to 7yo IM [employer] Fall 2023 Covid-19 Vaccine 56yr and older  Moderate episode of recurrent major depressive disorder (HCC) -     buPROPion (WELLBUTRIN XL) 150 MG 24 hr tablet; Take 1 tablet (150 mg total) by mouth daily. -     sertraline (ZOLOFT) 100 MG tablet; Take 2 tablets (200 mg total) by mouth daily.  Panic attacks -     sertraline (ZOLOFT) 100 MG tablet; Take 2 tablets (200 mg total) by mouth daily.  HYPERTENSION, MILD -     CMP14+EGFR -     losartan (COZAAR) 100 MG tablet; Take 1 tablet (100 mg total) by mouth daily.  Generalized anxiety disorder -     buPROPion (WELLBUTRIN XL) 150 MG 24 hr tablet; Take 1 tablet (150 mg total) by mouth daily. -     sertraline (ZOLOFT) 100 MG tablet; Take 2 tablets (200 mg total) by mouth daily.  Adjustment disorder with mixed anxiety and depressed mood -     buPROPion (WELLBUTRIN XL) 150 MG 24 hr tablet; Take 1 tablet (150 mg total) by mouth daily. -     sertraline (ZOLOFT) 100 MG tablet; Take 2 tablets (200 mg total) by mouth daily.    Tdap and covid shots given today Blood work ordered today  Refilled trulicity-not in stock replaced with mounjaro Continue to work on weight loss  Encouraged compliance with atorvastatin  BP elevated today but did not take meds yet this morning will check at home Needs mammogram and colonoscopy pt does not want orders placed [place]  Return in about 3 months (around 07/29/2022).    JIran Planas PA-C

## 2022-05-01 LAB — CBC WITH DIFFERENTIAL/PLATELET
Basophils Absolute: 0.1 10*3/uL (ref 0.0–0.2)
Basos: 1 %
EOS (ABSOLUTE): 0.6 10*3/uL — ABNORMAL HIGH (ref 0.0–0.4)
Eos: 7 %
Hematocrit: 35.5 % (ref 34.0–46.6)
Hemoglobin: 9.8 g/dL — ABNORMAL LOW (ref 11.1–15.9)
Immature Grans (Abs): 0 10*3/uL (ref 0.0–0.1)
Immature Granulocytes: 0 %
Lymphocytes Absolute: 2.4 10*3/uL (ref 0.7–3.1)
Lymphs: 28 %
MCH: 18.2 pg — ABNORMAL LOW (ref 26.6–33.0)
MCHC: 27.6 g/dL — ABNORMAL LOW (ref 31.5–35.7)
MCV: 66 fL — ABNORMAL LOW (ref 79–97)
Monocytes Absolute: 0.5 10*3/uL (ref 0.1–0.9)
Monocytes: 6 %
Neutrophils Absolute: 5 10*3/uL (ref 1.4–7.0)
Neutrophils: 58 %
Platelets: 514 10*3/uL — ABNORMAL HIGH (ref 150–450)
RBC: 5.39 x10E6/uL — ABNORMAL HIGH (ref 3.77–5.28)
RDW: 18.5 % — ABNORMAL HIGH (ref 11.7–15.4)
WBC: 8.6 10*3/uL (ref 3.4–10.8)

## 2022-05-01 LAB — TSH: TSH: 3.44 u[IU]/mL (ref 0.450–4.500)

## 2022-05-01 LAB — LIPID PANEL WITH LDL/HDL RATIO
Cholesterol, Total: 187 mg/dL (ref 100–199)
HDL: 47 mg/dL (ref >39)
LDL Chol Calc (NIH): 117 mg/dL — ABNORMAL HIGH (ref 0–99)
LDL/HDL Ratio: 2.5 ratio (ref 0.0–3.2)
Triglycerides: 128 mg/dL (ref 0–149)
VLDL Cholesterol Cal: 23 mg/dL (ref 5–40)

## 2022-05-01 LAB — CMP14+EGFR
ALT: 29 IU/L (ref 0–32)
AST: 30 IU/L (ref 0–40)
Albumin/Globulin Ratio: 1.4 (ref 1.2–2.2)
Albumin: 4.4 g/dL (ref 3.9–4.9)
Alkaline Phosphatase: 74 IU/L (ref 44–121)
BUN/Creatinine Ratio: 16 (ref 9–23)
BUN: 10 mg/dL (ref 6–24)
Bilirubin Total: 0.3 mg/dL (ref 0.0–1.2)
CO2: 22 mmol/L (ref 20–29)
Calcium: 9.6 mg/dL (ref 8.7–10.2)
Chloride: 101 mmol/L (ref 96–106)
Creatinine, Ser: 0.61 mg/dL (ref 0.57–1.00)
Globulin, Total: 3.1 g/dL (ref 1.5–4.5)
Glucose: 108 mg/dL — ABNORMAL HIGH (ref 70–99)
Potassium: 4.2 mmol/L (ref 3.5–5.2)
Sodium: 139 mmol/L (ref 134–144)
Total Protein: 7.5 g/dL (ref 6.0–8.5)
eGFR: 111 mL/min/{1.73_m2} (ref >59)

## 2022-05-01 LAB — VITAMIN D 25 HYDROXY (VIT D DEFICIENCY, FRACTURES): Vit D, 25-Hydroxy: 9.5 ng/mL — ABNORMAL LOW (ref 30.0–100.0)

## 2022-05-01 LAB — HEMOGLOBIN A1C
Est. average glucose Bld gHb Est-mCnc: 131 mg/dL
Hgb A1c MFr Bld: 6.2 % — ABNORMAL HIGH (ref 4.8–5.6)

## 2022-05-01 LAB — SPECIMEN STATUS REPORT

## 2022-05-03 ENCOUNTER — Other Ambulatory Visit: Payer: Self-pay | Admitting: Neurology

## 2022-05-03 ENCOUNTER — Other Ambulatory Visit (HOSPITAL_BASED_OUTPATIENT_CLINIC_OR_DEPARTMENT_OTHER): Payer: Self-pay

## 2022-05-03 ENCOUNTER — Other Ambulatory Visit: Payer: Self-pay | Admitting: Physician Assistant

## 2022-05-03 MED ORDER — VITAMIN D (ERGOCALCIFEROL) 1.25 MG (50000 UNIT) PO CAPS
50000.0000 [IU] | ORAL_CAPSULE | ORAL | 0 refills | Status: DC
  Filled 2022-05-03: qty 12, 84d supply, fill #0

## 2022-05-03 MED ORDER — LANCETS 28G MISC
1.0000 | Freq: Three times a day (TID) | 0 refills | Status: AC
  Filled 2022-05-03: qty 100, 30d supply, fill #0

## 2022-05-03 MED ORDER — FREESTYLE LIBRE 14 DAY READER DEVI
0 refills | Status: DC
  Filled 2022-05-03 (×2): qty 1, 30d supply, fill #0

## 2022-05-03 MED ORDER — GLUCOSE BLOOD VI STRP
1.0000 | ORAL_STRIP | Freq: Three times a day (TID) | 0 refills | Status: AC
  Filled 2022-05-03: qty 100, 30d supply, fill #0

## 2022-05-03 MED ORDER — BLOOD GLUCOSE MONITOR SYSTEM W/DEVICE KIT
1.0000 | PACK | Freq: Three times a day (TID) | 0 refills | Status: AC
  Filled 2022-05-03: qty 1, 30d supply, fill #0

## 2022-05-03 MED ORDER — LANCET DEVICE MISC
1.0000 | Freq: Three times a day (TID) | 0 refills | Status: AC
  Filled 2022-05-03: qty 1, 30d supply, fill #0

## 2022-05-03 NOTE — Progress Notes (Signed)
[name],   Vitamin d is really low. Start high dose vitamin D weekly for 3 months then recheck vitamin D level.  Thyroid is normal level.  A1C to goal at 6.2.  Kidney, liver look good.  Your hemogloblin is low. Will check ferritin and serum iron. Need to start iron supplement '60mg'$  daily with breakfast.  LDL, bad cholesterol, not to goal of under 70. Are you ok with increasing lipitor to '40mg'$ ?

## 2022-05-04 ENCOUNTER — Other Ambulatory Visit (HOSPITAL_BASED_OUTPATIENT_CLINIC_OR_DEPARTMENT_OTHER): Payer: Self-pay

## 2022-05-04 MED ORDER — FREESTYLE LIBRE 3 READER DEVI
1.0000 | Freq: Every day | 0 refills | Status: DC
  Filled 2022-05-04: qty 1, 30d supply, fill #0

## 2022-05-04 MED ORDER — FREESTYLE LIBRE 3 SENSOR MISC
1.0000 | Freq: Every day | 2 refills | Status: DC
  Filled 2022-05-04 – 2022-08-13 (×3): qty 2, 28d supply, fill #0

## 2022-05-04 NOTE — Addendum Note (Signed)
Addended byMargarette Asal L on: 05/04/2022 11:00 AM   Modules accepted: Orders

## 2022-05-06 ENCOUNTER — Telehealth: Payer: Self-pay | Admitting: Behavioral Health

## 2022-05-06 NOTE — Telephone Encounter (Signed)
Dr.Mann from [employer] called needing to do a peer to peer review of the disability for this pt.  Please call [phone] to conduct this review

## 2022-05-06 NOTE — Telephone Encounter (Signed)
Noted discussed with Theresa Beard and will contact pt

## 2022-05-12 ENCOUNTER — Other Ambulatory Visit (HOSPITAL_BASED_OUTPATIENT_CLINIC_OR_DEPARTMENT_OTHER): Payer: Self-pay

## 2022-05-13 ENCOUNTER — Telehealth: Payer: Self-pay | Admitting: Behavioral Health

## 2022-05-13 NOTE — Telephone Encounter (Signed)
Received disability forms. Given to [facility] 3/7

## 2022-06-23 ENCOUNTER — Other Ambulatory Visit (HOSPITAL_BASED_OUTPATIENT_CLINIC_OR_DEPARTMENT_OTHER): Payer: Self-pay

## 2022-06-23 ENCOUNTER — Telehealth: Payer: 59 | Admitting: Physician Assistant

## 2022-06-23 DIAGNOSIS — B9689 Other specified bacterial agents as the cause of diseases classified elsewhere: Secondary | ICD-10-CM

## 2022-06-23 DIAGNOSIS — J019 Acute sinusitis, unspecified: Secondary | ICD-10-CM

## 2022-06-23 DIAGNOSIS — R051 Acute cough: Secondary | ICD-10-CM

## 2022-06-23 MED ORDER — AMOXICILLIN-POT CLAVULANATE 875-125 MG PO TABS
1.0000 | ORAL_TABLET | Freq: Two times a day (BID) | ORAL | 0 refills | Status: DC
  Filled 2022-06-23: qty 14, 7d supply, fill #0

## 2022-06-23 MED ORDER — PROMETHAZINE-DM 6.25-15 MG/5ML PO SYRP
5.0000 mL | ORAL_SOLUTION | Freq: Four times a day (QID) | ORAL | 0 refills | Status: DC | PRN
  Filled 2022-06-23: qty 118, 6d supply, fill #0

## 2022-06-23 NOTE — Progress Notes (Signed)

## 2022-07-05 ENCOUNTER — Encounter: Payer: Self-pay | Admitting: Behavioral Health

## 2022-07-05 ENCOUNTER — Ambulatory Visit (INDEPENDENT_AMBULATORY_CARE_PROVIDER_SITE_OTHER): Payer: 59 | Admitting: Behavioral Health

## 2022-07-05 DIAGNOSIS — F411 Generalized anxiety disorder: Secondary | ICD-10-CM

## 2022-07-05 DIAGNOSIS — F3341 Major depressive disorder, recurrent, in partial remission: Secondary | ICD-10-CM | POA: Diagnosis not present

## 2022-07-05 NOTE — Progress Notes (Signed)
[facility]  Patient ID: Theresa Beard,  MRN: [id number]  PCP: Theresa Longs, PA-C  Date of Evaluation: 07/05/2022 Time spent:30 minutes  Chief Complaint:  Chief Complaint   Anxiety; Follow-up; Patient Education; Medication Refill; Depression     HISTORY/CURRENT STATUS: HPI  "Theresa Beard", pronounced "Nahna", 47 year old female presents to this office for follow up and to establish care. She is dressed very neat and well groomed. She is pleasant and smiling. Says that she is feeling really good and is ready to go back to work full time. Says she will obtain release paperwork to be signed.  She would like to continue to monitor but stay on current medication regimen. She is socializing and getting out more. Reports her anxiety at 2/10 and depression at 2/10. She does sleep 7-8 hours per night and it has improved since taking the hydroxyzine at bedtime along with her other rx meds. She denies mania, no psychosis, no SI/HI.    Prior psychiatric medication:   Lexapro-facial and lip swelling Wellbutrin     Individual Medical History/ Review of Systems: Changes? :No   Allergies: Lexapro [escitalopram], Metformin and related, and Lisinopril  Current Medications:  Current Outpatient Medications:    amoxicillin-clavulanate (AUGMENTIN) 875-125 MG tablet, Take 1 tablet by mouth 2 (two) times daily., Disp: 14 tablet, Rfl: 0   atorvastatin (LIPITOR) 20 MG tablet, Take 1 tablet (20 mg total) by mouth daily., Disp: 90 tablet, Rfl: 3   Blood Glucose Monitoring Suppl (BLOOD GLUCOSE MONITOR SYSTEM) w/Device KIT, Use to check blood sugar in the morning, at noon, and at bedtime., Disp: 1 kit, Rfl: 0   buPROPion (WELLBUTRIN XL) 150 MG 24 hr tablet, Take 1 tablet (150 mg total) by mouth daily., Disp: 30 tablet, Rfl: 3   Continuous Blood Gluc Receiver (FREESTYLE LIBRE 3 READER) DEVI, Use as directed., Disp: 1 each, Rfl: 0   Continuous Blood Gluc Sensor (FREESTYLE LIBRE 3 SENSOR)  MISC, Use as directed and change every 14 days., Disp: 2 each, Rfl: 2   esomeprazole (NEXIUM) 40 MG capsule, Take 1 capsule (40 mg total) by mouth daily., Disp: 90 capsule, Rfl: 3   fluticasone (FLONASE) 50 MCG/ACT nasal spray, Place 2 sprays into both nostrils daily., Disp: 16 g, Rfl: 0   hydrOXYzine (ATARAX) 10 MG tablet, Take 1 tablet (10 mg total) by mouth 3 (three) times daily as needed for anxiety., Disp: 90 tablet, Rfl: 0   losartan (COZAAR) 100 MG tablet, Take 1 tablet (100 mg total) by mouth daily., Disp: 90 tablet, Rfl: 1   ondansetron (ZOFRAN) 4 MG tablet, Take 1 tablet (4 mg total) by mouth every 8 (eight) hours as needed for nausea or vomiting., Disp: 20 tablet, Rfl: 0   promethazine-dextromethorphan (PROMETHAZINE-DM) 6.25-15 MG/5ML syrup, Take 5 mLs by mouth 4 (four) times daily as needed., Disp: 118 mL, Rfl: 0   sertraline (ZOLOFT) 100 MG tablet, Take 2 tablets (200 mg total) by mouth daily., Disp: 60 tablet, Rfl: 3   Suvorexant (BELSOMRA) 20 MG TABS, Take 1 tablet (20 mg) by mouth at bedtime., Disp: 30 tablet, Rfl: 1   tirzepatide (MOUNJARO) 7.5 MG/0.5ML Pen, Inject 7.5 mg into the skin once a week., Disp: 6 mL, Rfl: 1   Vitamin D, Ergocalciferol, (DRISDOL) 1.25 MG (50000 UNIT) CAPS capsule, Take 1 capsule (50,000 Units total) by mouth every 7 (seven) days. Take for 12 total doses(weeks) than can transition to 2000 units OTC supplement daily, Disp: 12 capsule, Rfl: 0 Medication Side  Effects: none  Family Medical/ Social History: Changes? No  MENTAL HEALTH EXAM:  There were no vitals taken for this visit.There is no height or weight on file to calculate BMI.  General Appearance: Casual, Neat, and Well Groomed  Eye Contact:  Good  Speech:  Clear and Coherent  Volume:  Normal  Mood:  NA  Affect:  Appropriate  Thought Process:  Coherent  Orientation:  Full (Time, Place, and Person)  Thought Content: Logical   Suicidal Thoughts:  No  Homicidal Thoughts:  No  Memory:  WNL   Judgement:  Good  Insight:  Good  Psychomotor Activity:  Normal  Concentration:  Concentration: Good  Recall:  Good  Fund of Knowledge: Good  Language: Good  Assets:  Desire for Improvement  ADL's:  Intact  Cognition: WNL  Prognosis:  Good    DIAGNOSES: No diagnosis found.  Receiving Psychotherapy: No    RECOMMENDATIONS:   Greater than 50% of  30 min face to face time with patient was spent on counseling and coordination of care. We discussed her significant improvement with anxiety and depression this visit. She is reporting feeling much better and says that she is ready to go back to work.  I will recommend that she return to 40 hours per week on her current job. She will provide documentation to this office to be signed.  To continue Zoloft 200 mg daily To continue Wellbutrin back to 150 mg XL daily Continue  Belsomra 20 mg daily at bedtime for sleep.  To continue Hydroxyzine 10 mg three times daily and at bedtime as needed.  To continue Trazodone 50 mg at bedtime daily Will report worsening symptoms or side effects To follow up in 3 months to reassess  No BC, not sexually active Reviewed PDMP  Joan Flores, NP

## 2022-07-30 ENCOUNTER — Encounter: Payer: 59 | Admitting: Physician Assistant

## 2022-08-09 ENCOUNTER — Other Ambulatory Visit (HOSPITAL_BASED_OUTPATIENT_CLINIC_OR_DEPARTMENT_OTHER): Payer: Self-pay

## 2022-08-13 ENCOUNTER — Other Ambulatory Visit (HOSPITAL_BASED_OUTPATIENT_CLINIC_OR_DEPARTMENT_OTHER): Payer: Self-pay

## 2022-08-30 ENCOUNTER — Other Ambulatory Visit (HOSPITAL_BASED_OUTPATIENT_CLINIC_OR_DEPARTMENT_OTHER): Payer: Self-pay

## 2022-10-04 ENCOUNTER — Ambulatory Visit (INDEPENDENT_AMBULATORY_CARE_PROVIDER_SITE_OTHER): Payer: 59 | Admitting: Behavioral Health

## 2022-10-04 ENCOUNTER — Other Ambulatory Visit (HOSPITAL_BASED_OUTPATIENT_CLINIC_OR_DEPARTMENT_OTHER): Payer: Self-pay

## 2022-10-04 ENCOUNTER — Encounter: Payer: Self-pay | Admitting: Behavioral Health

## 2022-10-04 DIAGNOSIS — F4323 Adjustment disorder with mixed anxiety and depressed mood: Secondary | ICD-10-CM | POA: Diagnosis not present

## 2022-10-04 DIAGNOSIS — F331 Major depressive disorder, recurrent, moderate: Secondary | ICD-10-CM

## 2022-10-04 DIAGNOSIS — F411 Generalized anxiety disorder: Secondary | ICD-10-CM

## 2022-10-04 DIAGNOSIS — F410 Panic disorder [episodic paroxysmal anxiety] without agoraphobia: Secondary | ICD-10-CM | POA: Diagnosis not present

## 2022-10-04 MED ORDER — BUPROPION HCL ER (XL) 150 MG PO TB24
150.0000 mg | ORAL_TABLET | Freq: Every day | ORAL | 3 refills | Status: DC
  Filled 2022-10-04: qty 30, 30d supply, fill #0
  Filled 2022-11-03: qty 30, 30d supply, fill #1

## 2022-10-04 MED ORDER — SERTRALINE HCL 100 MG PO TABS
200.0000 mg | ORAL_TABLET | Freq: Every day | ORAL | 3 refills | Status: DC
  Filled 2022-10-04: qty 60, 30d supply, fill #0
  Filled 2022-11-03: qty 60, 30d supply, fill #1

## 2022-10-04 NOTE — Progress Notes (Signed)
[facility]  Patient ID: Theresa Beard,  MRN: [id number]  PCP: Jomarie Longs, PA-C  Date of Evaluation: 10/04/2022 Time spent:30 minutes  Chief Complaint:  Chief Complaint   Anxiety; Depression; Follow-up; Medication Refill; Patient Education     HISTORY/CURRENT STATUS: HPI "Theresa Beard", pronounced "Nahna", 47 year old female presents to this office for follow up and to establish care. She is dressed very neat and well groomed. She is pleasant and smiling. Says she is now seeking full time employment with [employer].  She would like to continue to monitor but stay on current medication regimen. She is socializing and getting out more. Reports her anxiety at 2/10 and depression at 2/10. She does sleep 7-8 hours per night and it has improved since taking the hydroxyzine at bedtime along with her other rx meds. She denies mania, no psychosis, no SI/HI.    Prior psychiatric medication:   Lexapro-facial and lip swelling Wellbutrin     Individual Medical History/ Review of Systems: Changes? :No   Allergies: Lexapro [escitalopram], Metformin and related, and Lisinopril  Current Medications:  Current Outpatient Medications:    amoxicillin-clavulanate (AUGMENTIN) 875-125 MG tablet, Take 1 tablet by mouth 2 (two) times daily., Disp: 14 tablet, Rfl: 0   atorvastatin (LIPITOR) 20 MG tablet, Take 1 tablet (20 mg total) by mouth daily., Disp: 90 tablet, Rfl: 3   Blood Glucose Monitoring Suppl (BLOOD GLUCOSE MONITOR SYSTEM) w/Device KIT, Use to check blood sugar in the morning, at noon, and at bedtime., Disp: 1 kit, Rfl: 0   buPROPion (WELLBUTRIN XL) 150 MG 24 hr tablet, Take 1 tablet (150 mg total) by mouth daily., Disp: 30 tablet, Rfl: 3   Continuous Blood Gluc Receiver (FREESTYLE LIBRE 3 READER) DEVI, Use as directed., Disp: 1 each, Rfl: 0   Continuous Glucose Sensor (FREESTYLE LIBRE 3 SENSOR) MISC, Use as directed and change every 14 days., Disp: 2 each, Rfl: 2    esomeprazole (NEXIUM) 40 MG capsule, Take 1 capsule (40 mg total) by mouth daily., Disp: 90 capsule, Rfl: 3   fluticasone (FLONASE) 50 MCG/ACT nasal spray, Place 2 sprays into both nostrils daily., Disp: 16 g, Rfl: 0   hydrOXYzine (ATARAX) 10 MG tablet, Take 1 tablet (10 mg total) by mouth 3 (three) times daily as needed for anxiety., Disp: 90 tablet, Rfl: 0   losartan (COZAAR) 100 MG tablet, Take 1 tablet (100 mg total) by mouth daily., Disp: 90 tablet, Rfl: 1   ondansetron (ZOFRAN) 4 MG tablet, Take 1 tablet (4 mg total) by mouth every 8 (eight) hours as needed for nausea or vomiting., Disp: 20 tablet, Rfl: 0   promethazine-dextromethorphan (PROMETHAZINE-DM) 6.25-15 MG/5ML syrup, Take 5 mLs by mouth 4 (four) times daily as needed., Disp: 118 mL, Rfl: 0   sertraline (ZOLOFT) 100 MG tablet, Take 2 tablets (200 mg total) by mouth daily., Disp: 60 tablet, Rfl: 3   Suvorexant (BELSOMRA) 20 MG TABS, Take 1 tablet (20 mg) by mouth at bedtime., Disp: 30 tablet, Rfl: 1   tirzepatide (MOUNJARO) 7.5 MG/0.5ML Pen, Inject 7.5 mg into the skin once a week., Disp: 6 mL, Rfl: 1   Vitamin D, Ergocalciferol, (DRISDOL) 1.25 MG (50000 UNIT) CAPS capsule, Take 1 capsule (50,000 Units total) by mouth every 7 (seven) days. Take for 12 total doses(weeks) than can transition to 2000 units OTC supplement daily, Disp: 12 capsule, Rfl: 0 Medication Side Effects: none  Family Medical/ Social History: Changes? No  MENTAL HEALTH EXAM:  There were no  vitals taken for this visit.There is no height or weight on file to calculate BMI.  General Appearance: Casual, Neat, and Well Groomed  Eye Contact:  Good  Speech:  Clear and Coherent  Volume:  Normal  Mood:  Anxious, Depressed, and Dysphoric  Affect:  Appropriate  Thought Process:  Coherent  Orientation:  Full (Time, Place, and Person)  Thought Content: Logical   Suicidal Thoughts:  No  Homicidal Thoughts:  No  Memory:  WNL  Judgement:  Good  Insight:  Good   Psychomotor Activity:  Normal  Concentration:  Concentration: Good  Recall:  Good  Fund of Knowledge: Good  Language: Good  Assets:  Desire for Improvement  ADL's:  Intact  Cognition: WNL  Prognosis:  Good    DIAGNOSES:    ICD-10-CM   1. Moderate episode of recurrent major depressive disorder (HCC)  F33.1 sertraline (ZOLOFT) 100 MG tablet    buPROPion (WELLBUTRIN XL) 150 MG 24 hr tablet    2. Panic attacks  F41.0 sertraline (ZOLOFT) 100 MG tablet    3. Generalized anxiety disorder  F41.1 sertraline (ZOLOFT) 100 MG tablet    buPROPion (WELLBUTRIN XL) 150 MG 24 hr tablet    4. Adjustment disorder with mixed anxiety and depressed mood  F43.23 sertraline (ZOLOFT) 100 MG tablet    buPROPion (WELLBUTRIN XL) 150 MG 24 hr tablet      Receiving Psychotherapy: No    RECOMMENDATIONS:   Greater than 50% of  30 min face to face time with patient was spent on counseling and coordination of care. We discussed her significant improvement with anxiety and depression this visit. Still working part time but she would like to find full time position now.   To continue Zoloft 200 mg daily To continue Wellbutrin back to 150 mg XL daily Continue  Belsomra 20 mg daily at bedtime for sleep.  To continue Hydroxyzine 10 mg three times daily and at bedtime as needed.  To continue Trazodone 50 mg at bedtime daily Will report worsening symptoms or side effects To follow up in 3 months to reassess  No BC, not sexually active  Reviewed PDMP          Joan Flores, NP

## 2022-10-05 ENCOUNTER — Other Ambulatory Visit (HOSPITAL_BASED_OUTPATIENT_CLINIC_OR_DEPARTMENT_OTHER): Payer: Self-pay

## 2022-10-08 ENCOUNTER — Other Ambulatory Visit (HOSPITAL_COMMUNITY): Payer: Self-pay

## 2022-11-03 ENCOUNTER — Other Ambulatory Visit (HOSPITAL_BASED_OUTPATIENT_CLINIC_OR_DEPARTMENT_OTHER): Payer: Self-pay

## 2022-12-17 ENCOUNTER — Other Ambulatory Visit (HOSPITAL_BASED_OUTPATIENT_CLINIC_OR_DEPARTMENT_OTHER): Payer: Self-pay

## 2022-12-29 ENCOUNTER — Other Ambulatory Visit (HOSPITAL_BASED_OUTPATIENT_CLINIC_OR_DEPARTMENT_OTHER): Payer: Self-pay

## 2022-12-29 ENCOUNTER — Encounter: Payer: Self-pay | Admitting: Physician Assistant

## 2022-12-29 DIAGNOSIS — E1169 Type 2 diabetes mellitus with other specified complication: Secondary | ICD-10-CM

## 2022-12-29 MED ORDER — TIRZEPATIDE 7.5 MG/0.5ML ~~LOC~~ SOAJ
7.5000 mg | SUBCUTANEOUS | 0 refills | Status: DC
  Filled 2022-12-29: qty 6, 84d supply, fill #0
  Filled 2023-01-05: qty 2, 28d supply, fill #0
  Filled 2023-03-25: qty 2, 28d supply, fill #1

## 2022-12-29 NOTE — Telephone Encounter (Signed)
Forwarded message to [employer].

## 2022-12-30 ENCOUNTER — Other Ambulatory Visit (HOSPITAL_BASED_OUTPATIENT_CLINIC_OR_DEPARTMENT_OTHER): Payer: Self-pay

## 2022-12-30 MED ORDER — FLULAVAL 0.5 ML IM SUSY
0.5000 mL | PREFILLED_SYRINGE | Freq: Once | INTRAMUSCULAR | 0 refills | Status: AC
  Filled 2022-12-30: qty 0.5, 1d supply, fill #0

## 2023-01-04 ENCOUNTER — Encounter: Payer: Self-pay | Admitting: Behavioral Health

## 2023-01-04 ENCOUNTER — Other Ambulatory Visit (HOSPITAL_BASED_OUTPATIENT_CLINIC_OR_DEPARTMENT_OTHER): Payer: Self-pay

## 2023-01-04 ENCOUNTER — Ambulatory Visit (INDEPENDENT_AMBULATORY_CARE_PROVIDER_SITE_OTHER): Payer: 59 | Admitting: Behavioral Health

## 2023-01-04 DIAGNOSIS — F4323 Adjustment disorder with mixed anxiety and depressed mood: Secondary | ICD-10-CM

## 2023-01-04 DIAGNOSIS — F331 Major depressive disorder, recurrent, moderate: Secondary | ICD-10-CM

## 2023-01-04 DIAGNOSIS — F411 Generalized anxiety disorder: Secondary | ICD-10-CM

## 2023-01-04 DIAGNOSIS — F410 Panic disorder [episodic paroxysmal anxiety] without agoraphobia: Secondary | ICD-10-CM | POA: Diagnosis not present

## 2023-01-04 MED ORDER — BUPROPION HCL ER (XL) 150 MG PO TB24
150.0000 mg | ORAL_TABLET | Freq: Every day | ORAL | 3 refills | Status: DC
  Filled 2023-01-04: qty 30, 30d supply, fill #0
  Filled 2023-08-26: qty 30, 30d supply, fill #1

## 2023-01-04 MED ORDER — SERTRALINE HCL 100 MG PO TABS
200.0000 mg | ORAL_TABLET | Freq: Every day | ORAL | 3 refills | Status: DC
  Filled 2023-01-04: qty 60, 30d supply, fill #0
  Filled 2023-09-23: qty 60, 30d supply, fill #1
  Filled 2023-10-25: qty 60, 30d supply, fill #2

## 2023-01-04 NOTE — Progress Notes (Signed)
[facility]  Patient ID: Theresa Beard,  MRN: [id number]  PCP: Theresa Longs, PA-C  Date of Evaluation: 01/04/2023 Time spent:30 minutes  Chief Complaint:  Chief Complaint   Anxiety; Depression; Follow-up; Patient Education; Medication Refill     HISTORY/CURRENT STATUS: HPI  "Theresa Beard", pronounced "Nahna", 47 year old female presents to this office for follow up and to establish care. She is dressed very neat and well groomed. She is pleasant and smiling. Says she is now seeking full time employment with [employer].  She will be leaving to go to [place] to visit sick relative in early Nov. Gone for one month. For now she is very stable but would like to consider weaning off medication when she returns.  She would like to continue to monitor but stay on current medication regimen. She is socializing and getting out more. Reports her anxiety at 2/10 and depression at 2/10. She does sleep 7-8 hours per night and it has improved since taking the hydroxyzine at bedtime along with her other rx meds. She denies mania, no psychosis, no SI/HI.    Prior psychiatric medication:   Lexapro-facial and lip swelling Wellbutrin    Individual Medical History/ Review of Systems: Changes? :No   Allergies: Lexapro [escitalopram], Metformin and related, and Lisinopril  Current Medications:  Current Outpatient Medications:    atorvastatin (LIPITOR) 20 MG tablet, Take 1 tablet (20 mg total) by mouth daily., Disp: 90 tablet, Rfl: 3   Blood Glucose Monitoring Suppl (BLOOD GLUCOSE MONITOR SYSTEM) w/Device KIT, Use to check blood sugar in the morning, at noon, and at bedtime., Disp: 1 kit, Rfl: 0   buPROPion (WELLBUTRIN XL) 150 MG 24 hr tablet, Take 1 tablet (150 mg total) by mouth daily., Disp: 30 tablet, Rfl: 3   Continuous Blood Gluc Receiver (FREESTYLE LIBRE 3 READER) DEVI, Use as directed., Disp: 1 each, Rfl: 0   Continuous Glucose Sensor (FREESTYLE LIBRE 3 SENSOR) MISC, Use as  directed and change every 14 days., Disp: 2 each, Rfl: 2   esomeprazole (NEXIUM) 40 MG capsule, Take 1 capsule (40 mg total) by mouth daily., Disp: 90 capsule, Rfl: 3   fluticasone (FLONASE) 50 MCG/ACT nasal spray, Place 2 sprays into both nostrils daily., Disp: 16 g, Rfl: 0   hydrOXYzine (ATARAX) 10 MG tablet, Take 1 tablet (10 mg total) by mouth 3 (three) times daily as needed for anxiety., Disp: 90 tablet, Rfl: 0   losartan (COZAAR) 100 MG tablet, Take 1 tablet (100 mg total) by mouth daily., Disp: 90 tablet, Rfl: 1   ondansetron (ZOFRAN) 4 MG tablet, Take 1 tablet (4 mg total) by mouth every 8 (eight) hours as needed for nausea or vomiting., Disp: 20 tablet, Rfl: 0   sertraline (ZOLOFT) 100 MG tablet, Take 2 tablets (200 mg total) by mouth daily., Disp: 60 tablet, Rfl: 3   Suvorexant (BELSOMRA) 20 MG TABS, Take 1 tablet (20 mg) by mouth at bedtime., Disp: 30 tablet, Rfl: 1   tirzepatide (MOUNJARO) 7.5 MG/0.5ML Pen, Inject 7.5 mg into the skin once a week., Disp: 6 mL, Rfl: 0   Vitamin D, Ergocalciferol, (DRISDOL) 1.25 MG (50000 UNIT) CAPS capsule, Take 1 capsule (50,000 Units total) by mouth every 7 (seven) days. Take for 12 total doses(weeks) than can transition to 2000 units OTC supplement daily, Disp: 12 capsule, Rfl: 0 Medication Side Effects: none  Family Medical/ Social History: Changes? No  MENTAL HEALTH EXAM:  There were no vitals taken for this visit.There is no  height or weight on file to calculate BMI.  General Appearance: Casual, Neat, and Well Groomed  Eye Contact:  Good  Speech:  Clear and Coherent  Volume:  Normal  Mood:  NA  Affect:  Appropriate  Thought Process:  Coherent  Orientation:  Full (Time, Place, and Person)  Thought Content: Logical   Suicidal Thoughts:  No  Homicidal Thoughts:  No  Memory:  WNL  Judgement:  Good  Insight:  Good  Psychomotor Activity:  Normal  Concentration:  Concentration: Good  Recall:  Good  Fund of Knowledge: Good  Language: Good   Assets:  Desire for Improvement  ADL's:  Intact  Cognition: WNL  Prognosis:  Good    DIAGNOSES:    ICD-10-CM   1. Moderate episode of recurrent major depressive disorder (HCC)  F33.1 sertraline (ZOLOFT) 100 MG tablet    buPROPion (WELLBUTRIN XL) 150 MG 24 hr tablet    2. Panic attacks  F41.0 sertraline (ZOLOFT) 100 MG tablet    3. Generalized anxiety disorder  F41.1 sertraline (ZOLOFT) 100 MG tablet    buPROPion (WELLBUTRIN XL) 150 MG 24 hr tablet    4. Adjustment disorder with mixed anxiety and depressed mood  F43.23 sertraline (ZOLOFT) 100 MG tablet    buPROPion (WELLBUTRIN XL) 150 MG 24 hr tablet      Receiving Psychotherapy: No    RECOMMENDATIONS:   Greater than 50% of  30 min face to face time with patient was spent on counseling and coordination of care. We discussed her significant improvement with anxiety and depression this visit. She is requesting no medication changes but would like to discuss weaning next visit. To continue Zoloft 200 mg daily To continue Wellbutrin back to 150 mg XL daily Will report worsening symptoms or side effects To follow up in 3 months to reassess  No BC, not sexually active  Reviewed PDMP  Theresa Flores, NP

## 2023-01-05 ENCOUNTER — Other Ambulatory Visit (HOSPITAL_BASED_OUTPATIENT_CLINIC_OR_DEPARTMENT_OTHER): Payer: Self-pay

## 2023-01-05 ENCOUNTER — Encounter (HOSPITAL_BASED_OUTPATIENT_CLINIC_OR_DEPARTMENT_OTHER): Payer: Self-pay

## 2023-03-16 ENCOUNTER — Ambulatory Visit: Payer: 59 | Admitting: Behavioral Health

## 2023-03-25 ENCOUNTER — Other Ambulatory Visit (HOSPITAL_BASED_OUTPATIENT_CLINIC_OR_DEPARTMENT_OTHER): Payer: Self-pay

## 2023-04-26 ENCOUNTER — Other Ambulatory Visit (HOSPITAL_BASED_OUTPATIENT_CLINIC_OR_DEPARTMENT_OTHER): Payer: Self-pay

## 2023-05-06 ENCOUNTER — Other Ambulatory Visit (HOSPITAL_BASED_OUTPATIENT_CLINIC_OR_DEPARTMENT_OTHER): Payer: Self-pay

## 2023-05-06 ENCOUNTER — Encounter: Payer: Self-pay | Admitting: Physician Assistant

## 2023-05-06 ENCOUNTER — Ambulatory Visit: Payer: Commercial Managed Care - PPO | Admitting: Physician Assistant

## 2023-05-06 VITALS — BP 120/79 | HR 76 | Ht 65.0 in | Wt 250.1 lb

## 2023-05-06 DIAGNOSIS — E785 Hyperlipidemia, unspecified: Secondary | ICD-10-CM | POA: Diagnosis not present

## 2023-05-06 DIAGNOSIS — F410 Panic disorder [episodic paroxysmal anxiety] without agoraphobia: Secondary | ICD-10-CM | POA: Diagnosis not present

## 2023-05-06 DIAGNOSIS — F411 Generalized anxiety disorder: Secondary | ICD-10-CM | POA: Diagnosis not present

## 2023-05-06 DIAGNOSIS — Z0000 Encounter for general adult medical examination without abnormal findings: Secondary | ICD-10-CM

## 2023-05-06 DIAGNOSIS — E559 Vitamin D deficiency, unspecified: Secondary | ICD-10-CM | POA: Insufficient documentation

## 2023-05-06 DIAGNOSIS — F331 Major depressive disorder, recurrent, moderate: Secondary | ICD-10-CM

## 2023-05-06 DIAGNOSIS — I10 Essential (primary) hypertension: Secondary | ICD-10-CM | POA: Diagnosis not present

## 2023-05-06 DIAGNOSIS — K219 Gastro-esophageal reflux disease without esophagitis: Secondary | ICD-10-CM | POA: Diagnosis not present

## 2023-05-06 DIAGNOSIS — Z1211 Encounter for screening for malignant neoplasm of colon: Secondary | ICD-10-CM

## 2023-05-06 DIAGNOSIS — E1169 Type 2 diabetes mellitus with other specified complication: Secondary | ICD-10-CM | POA: Diagnosis not present

## 2023-05-06 DIAGNOSIS — Z1329 Encounter for screening for other suspected endocrine disorder: Secondary | ICD-10-CM

## 2023-05-06 DIAGNOSIS — Z1231 Encounter for screening mammogram for malignant neoplasm of breast: Secondary | ICD-10-CM

## 2023-05-06 LAB — POCT UA - MICROALBUMIN
Albumin/Creatinine Ratio, Urine, POC: <30
Creatinine, POC: 200 mg/dL
Microalbumin Ur, POC: 30 mg/L

## 2023-05-06 LAB — POCT GLYCOSYLATED HEMOGLOBIN (HGB A1C)
HbA1c, POC (controlled diabetic range): 5.4 % (ref 0.0–7.0)
Hemoglobin A1C: 5.4 % (ref 4.0–5.6)

## 2023-05-06 MED ORDER — ESOMEPRAZOLE MAGNESIUM 40 MG PO CPDR
40.0000 mg | DELAYED_RELEASE_CAPSULE | Freq: Two times a day (BID) | ORAL | 3 refills | Status: AC
  Filled 2023-05-06 – 2023-10-25 (×2): qty 180, 90d supply, fill #0

## 2023-05-06 MED ORDER — TIRZEPATIDE 7.5 MG/0.5ML ~~LOC~~ SOAJ
7.5000 mg | SUBCUTANEOUS | 0 refills | Status: DC
  Filled 2023-05-06: qty 6, 84d supply, fill #0

## 2023-05-06 MED ORDER — LOSARTAN POTASSIUM 100 MG PO TABS
100.0000 mg | ORAL_TABLET | Freq: Every day | ORAL | 1 refills | Status: DC
  Filled 2023-05-06: qty 90, 90d supply, fill #0
  Filled 2023-08-26: qty 90, 90d supply, fill #1

## 2023-05-06 NOTE — Progress Notes (Signed)
 Established Patient Office Visit  Subjective   Patient ID: Theresa Beard, female    DOB: 1975-05-21  Age: 48 y.o. MRN: [id number]  CC: medication refills   HPI Patient is a 48 yo female with T2DM, GAD, MDD, and HTN who presents for medication refills.   She is taking Mounjaro 7.5mg  weekly injection. She has not been using her Freestyle [place] b/c she didn't know how to use it or connect it to her phone. She states she is doing great and is happier. She has gone from a size 22 to an 18 since starting Mounjaro. She is trying to stay disciplined with her diet. She is down 28 pounds. She would like to get off zoloft and wellbutrin but will discuss with BH. She is much more active. She denies any CP, palpitations, headaches or vision changes.   .. 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   Cervical degenerative disc disease 11/05/2011   Gastroesophageal reflux disease without esophagitis 01/11/2012   Chondromalacia of left patellofemoral joint 03/27/2012   Generalized anxiety disorder 11/05/2013   Microcytic anemia 11/07/2013   Vitamin D insufficiency 11/07/2013   Trouble in sleeping 01/27/2015   Diabetes mellitus (HCC) 01/27/2015   Class 3 severe obesity due to excess calories with serious comorbidity and body mass index (BMI) of 45.0 to 49.9 in adult (HCC) 07/11/2015   Stress at work 04/06/2017   Irritable 04/06/2017   Acquired skin tag 09/01/2017   Tendinitis of left triceps 09/02/2017   Plantar fasciitis of left foot 09/02/2017   Elevated fasting glucose 05/14/2019   Medication side effect, initial encounter 11/21/2019   Hyperlipidemia LDL goal <70 05/02/2020   Iron deficiency anemia secondary to inadequate dietary iron intake 05/02/2020   Panic attacks 03/16/2021   Acute reaction to stress 03/16/2021   Adjustment disorder with mixed anxiety and depressed mood 03/26/2021   Allergic  reaction 04/03/2021   Moderate episode of recurrent major depressive disorder (HCC) 05/14/2021   Left lateral epicondylitis 08/27/2021   Vitamin D deficiency 05/06/2023   Resolved Ambulatory Problems    Diagnosis Date Noted   CHOLECYSTITIS - ACUTE 12/11/2009   Abdominal pain 12/11/2009   Peroneal tendinitis of left lower extremity 11/23/2011   Obesity 11/23/2011   Abnormality of gait 12/14/2011   Influenza-like illness 04/21/2012   Subacromial bursitis 03/16/2013   Skin lesion 04/10/2013   Pre-diabetes 04/06/2015   Pain of left thumb 07/28/2015   Medial epicondylitis, left 09/01/2017   No Additional Past Medical History     Review of Systems  All other systems reviewed and are negative.  Objective:     BP 120/79   Pulse 76   Ht 5\' 5"  (1.651 m)   Wt 250 lb 1.9 oz (113.5 kg)   SpO2 99%   BMI 41.62 kg/m  BP Readings from Last 3 Encounters:  05/06/23 120/79  04/30/22 (!) 146/78  01/01/22 135/73   Wt Readings from Last 3 Encounters:  05/06/23 250 lb 1.9 oz (113.5 kg)  04/30/22 278 lb 0.6 oz (126.1 kg)  01/01/22 271 lb (122.9 kg)   SpO2 Readings from Last 3 Encounters:  05/06/23 99%  04/30/22 96%  01/01/22 96%     Physical Exam Constitutional:      Appearance: Normal appearance.  HENT:     Head: Normocephalic and atraumatic.  Eyes:     Extraocular Movements: Extraocular movements intact.  Cardiovascular:  Rate and Rhythm: Normal rate and regular rhythm.     Pulses: Normal pulses.     Heart sounds: Normal heart sounds.  Pulmonary:     Effort: Pulmonary effort is normal.     Breath sounds: Normal breath sounds.  Musculoskeletal:        General: Normal range of motion.     Cervical back: Normal range of motion.  Neurological:     Mental Status: She is alert.  Psychiatric:        Mood and Affect: Mood normal.        Behavior: Behavior normal.        Thought Content: Thought content normal.     Last CBC Lab Results  Component Value Date   WBC  8.6 04/30/2022   HGB 9.8 (L) 04/30/2022   HCT 35.5 04/30/2022   MCV 66 (L) 04/30/2022   MCH 18.2 (L) 04/30/2022   RDW 18.5 (H) 04/30/2022   PLT 514 (H) 04/30/2022   Last metabolic panel Lab Results  Component Value Date   GLUCOSE 84 05/06/2023   NA 139 05/06/2023   K 4.6 05/06/2023   CL 104 05/06/2023   CO2 22 05/06/2023   BUN 13 05/06/2023   CREATININE 0.60 05/06/2023   EGFR 111 05/06/2023   CALCIUM 9.3 05/06/2023   PROT 7.4 05/06/2023   ALBUMIN 4.3 05/06/2023   LABGLOB 3.1 05/06/2023   AGRATIO 1.4 04/30/2022   BILITOT 0.3 05/06/2023   ALKPHOS 72 05/06/2023   AST 21 05/06/2023   ALT 19 05/06/2023   Last lipids Lab Results  Component Value Date   CHOL 148 05/06/2023   HDL 46 05/06/2023   LDLCALC 87 05/06/2023   TRIG 76 05/06/2023   CHOLHDL 3.2 05/06/2023   Last hemoglobin A1c Lab Results  Component Value Date   HGBA1C 5.4 05/06/2023   HGBA1C 5.4 05/06/2023   Last thyroid functions Lab Results  Component Value Date   TSH 2.920 05/06/2023   Last vitamin D Lab Results  Component Value Date   VD25OH 18.3 (L) 05/06/2023   .Marland Kitchen Diabetic Foot Exam - Simple   Simple Foot Form Diabetic Foot exam was performed with the following findings: Yes 05/06/2023  9:28 AM  Visual Inspection No deformities, no ulcerations, no other skin breakdown bilaterally: Yes Sensation Testing Intact to touch and monofilament testing bilaterally: Yes Pulse Check Posterior Tibialis and Dorsalis pulse intact bilaterally: Yes Comments       Assessment & Plan:  .Marland KitchenJitruttana "[name]" was seen today for diabetes.  Diagnoses and all orders for this visit:  Type 2 diabetes mellitus with other specified complication, without long-term current use of insulin (HCC) -     tirzepatide (MOUNJARO) 7.5 MG/0.5ML Pen; Inject 7.5 mg into the skin once a week. -     CMP14+EGFR -     Lipid panel -     POCT HgB A1C -     POCT UA - Microalbumin  HYPERTENSION, MILD -     losartan (COZAAR) 100 MG  tablet; Take 1 tablet (100 mg total) by mouth daily. -     CMP14+EGFR  Gastroesophageal reflux disease without esophagitis -     esomeprazole (NEXIUM) 40 MG capsule; Take 1 capsule (40 mg total) by mouth 2 (two) times daily before a meal.  Colon cancer screening -     Ambulatory referral to Gastroenterology  Visit for screening mammogram -     MM 3D SCREENING MAMMOGRAM BILATERAL BREAST; Future  Vitamin D deficiency -  VITAMIN D 25 Hydroxy (Vit-D Deficiency, Fractures)  Hyperlipidemia LDL goal <70 -     Lipid panel  Thyroid disorder screening -     TSH  Preventative health care -     Ambulatory referral to Gastroenterology -     MM 3D SCREENING MAMMOGRAM BILATERAL BREAST; Future -     VITAMIN D 25 Hydroxy (Vit-D Deficiency, Fractures) -     CMP14+EGFR -     TSH -     Lipid panel    - Referral for mammogram, colonoscopy - Due for DM foot exam, eGFR and kidney eval today - Due for cervical cx screening, will schedule with Kerolos Nehme for this.  - A1C today: 5.4%  - Congratulated patient on meeting this goal-continue same medications - BP goal <130/12mmHg  - BP to goal today! 120/80mmHg - She states she is going to schedule her DM eye exam - Fasting labs ordered today - Follow up in 3 months make CPE with pap  Tandy Gaw, PA-C

## 2023-05-06 NOTE — Patient Instructions (Signed)
Doing GREAT

## 2023-05-07 LAB — CMP14+EGFR
ALT: 19 IU/L (ref 0–32)
AST: 21 IU/L (ref 0–40)
Albumin: 4.3 g/dL (ref 3.9–4.9)
Alkaline Phosphatase: 72 IU/L (ref 44–121)
BUN/Creatinine Ratio: 22 (ref 9–23)
BUN: 13 mg/dL (ref 6–24)
Bilirubin Total: 0.3 mg/dL (ref 0.0–1.2)
CO2: 22 mmol/L (ref 20–29)
Calcium: 9.3 mg/dL (ref 8.7–10.2)
Chloride: 104 mmol/L (ref 96–106)
Creatinine, Ser: 0.6 mg/dL (ref 0.57–1.00)
Globulin, Total: 3.1 g/dL (ref 1.5–4.5)
Glucose: 84 mg/dL (ref 70–99)
Potassium: 4.6 mmol/L (ref 3.5–5.2)
Sodium: 139 mmol/L (ref 134–144)
Total Protein: 7.4 g/dL (ref 6.0–8.5)
eGFR: 111 mL/min/{1.73_m2} (ref >59)

## 2023-05-07 LAB — TSH: TSH: 2.92 u[IU]/mL (ref 0.450–4.500)

## 2023-05-07 LAB — LIPID PANEL
Chol/HDL Ratio: 3.2 ratio (ref 0.0–4.4)
Cholesterol, Total: 148 mg/dL (ref 100–199)
HDL: 46 mg/dL (ref >39)
LDL Chol Calc (NIH): 87 mg/dL (ref 0–99)
Triglycerides: 76 mg/dL (ref 0–149)
VLDL Cholesterol Cal: 15 mg/dL (ref 5–40)

## 2023-05-07 LAB — VITAMIN D 25 HYDROXY (VIT D DEFICIENCY, FRACTURES): Vit D, 25-Hydroxy: 18.3 ng/mL — ABNORMAL LOW (ref 30.0–100.0)

## 2023-05-09 ENCOUNTER — Encounter: Payer: Self-pay | Admitting: Physician Assistant

## 2023-05-09 NOTE — Progress Notes (Signed)
 [name],   Vitamin D low! You need to be on at least 2000 units daily with dairy for better absorption.  Thyroid normal.  Kidney, liver, glucose look great.  Cholesterol is very close to goal but LDL not under 70. I would like to increase lipitor to 40mg  daily. Are you ok with this?

## 2023-05-10 ENCOUNTER — Other Ambulatory Visit (HOSPITAL_BASED_OUTPATIENT_CLINIC_OR_DEPARTMENT_OTHER): Payer: Self-pay

## 2023-05-10 MED ORDER — ATORVASTATIN CALCIUM 40 MG PO TABS
40.0000 mg | ORAL_TABLET | Freq: Every day | ORAL | 3 refills | Status: AC
  Filled 2023-05-10: qty 90, 90d supply, fill #0
  Filled 2023-08-26: qty 90, 90d supply, fill #1

## 2023-05-17 ENCOUNTER — Other Ambulatory Visit (HOSPITAL_BASED_OUTPATIENT_CLINIC_OR_DEPARTMENT_OTHER): Payer: Self-pay

## 2023-05-26 ENCOUNTER — Ambulatory Visit: Payer: Commercial Managed Care - PPO

## 2023-05-26 DIAGNOSIS — Z1231 Encounter for screening mammogram for malignant neoplasm of breast: Secondary | ICD-10-CM | POA: Diagnosis not present

## 2023-05-26 DIAGNOSIS — Z0000 Encounter for general adult medical examination without abnormal findings: Secondary | ICD-10-CM

## 2023-05-30 ENCOUNTER — Encounter: Payer: Self-pay | Admitting: Physician Assistant

## 2023-05-30 NOTE — Progress Notes (Signed)
 Normal mammogram. Follow up in 1 year.

## 2023-07-08 ENCOUNTER — Other Ambulatory Visit: Payer: Self-pay | Admitting: Physician Assistant

## 2023-07-08 ENCOUNTER — Other Ambulatory Visit (HOSPITAL_BASED_OUTPATIENT_CLINIC_OR_DEPARTMENT_OTHER): Payer: Self-pay

## 2023-07-08 DIAGNOSIS — E1169 Type 2 diabetes mellitus with other specified complication: Secondary | ICD-10-CM

## 2023-07-12 ENCOUNTER — Other Ambulatory Visit (HOSPITAL_BASED_OUTPATIENT_CLINIC_OR_DEPARTMENT_OTHER): Payer: Self-pay

## 2023-07-12 MED ORDER — MOUNJARO 7.5 MG/0.5ML ~~LOC~~ SOAJ
7.5000 mg | SUBCUTANEOUS | 0 refills | Status: DC
  Filled 2023-07-12 – 2023-07-15 (×2): qty 6, 84d supply, fill #0

## 2023-07-15 ENCOUNTER — Other Ambulatory Visit (HOSPITAL_BASED_OUTPATIENT_CLINIC_OR_DEPARTMENT_OTHER): Payer: Self-pay

## 2023-08-05 ENCOUNTER — Encounter: Payer: Commercial Managed Care - PPO | Admitting: Physician Assistant

## 2023-09-06 ENCOUNTER — Other Ambulatory Visit (HOSPITAL_BASED_OUTPATIENT_CLINIC_OR_DEPARTMENT_OTHER): Payer: Self-pay

## 2023-09-23 ENCOUNTER — Other Ambulatory Visit (HOSPITAL_BASED_OUTPATIENT_CLINIC_OR_DEPARTMENT_OTHER): Payer: Self-pay

## 2023-10-25 ENCOUNTER — Other Ambulatory Visit: Payer: Self-pay | Admitting: Physician Assistant

## 2023-10-25 ENCOUNTER — Other Ambulatory Visit (HOSPITAL_BASED_OUTPATIENT_CLINIC_OR_DEPARTMENT_OTHER): Payer: Self-pay

## 2023-10-25 ENCOUNTER — Other Ambulatory Visit: Payer: Self-pay

## 2023-10-25 DIAGNOSIS — E1169 Type 2 diabetes mellitus with other specified complication: Secondary | ICD-10-CM

## 2023-11-01 ENCOUNTER — Other Ambulatory Visit (HOSPITAL_BASED_OUTPATIENT_CLINIC_OR_DEPARTMENT_OTHER): Payer: Self-pay

## 2023-11-01 MED ORDER — MOUNJARO 7.5 MG/0.5ML ~~LOC~~ SOAJ
7.5000 mg | SUBCUTANEOUS | 0 refills | Status: DC
  Filled 2023-11-01: qty 6, 84d supply, fill #0

## 2023-11-03 ENCOUNTER — Other Ambulatory Visit (HOSPITAL_BASED_OUTPATIENT_CLINIC_OR_DEPARTMENT_OTHER): Payer: Self-pay

## 2023-11-27 ENCOUNTER — Other Ambulatory Visit (HOSPITAL_BASED_OUTPATIENT_CLINIC_OR_DEPARTMENT_OTHER): Payer: Self-pay

## 2023-12-24 ENCOUNTER — Other Ambulatory Visit (HOSPITAL_BASED_OUTPATIENT_CLINIC_OR_DEPARTMENT_OTHER): Payer: Self-pay

## 2023-12-24 MED ORDER — FLUZONE 0.5 ML IM SUSY
0.5000 mL | PREFILLED_SYRINGE | Freq: Once | INTRAMUSCULAR | 0 refills | Status: AC
  Filled 2023-12-24: qty 0.5, 1d supply, fill #0

## 2024-01-19 ENCOUNTER — Telehealth: Payer: Self-pay

## 2024-01-19 NOTE — Telephone Encounter (Signed)
 Attempted to contact patient in regards to scheduling an appointment for an annual exam. Unfortunately I did not receive a response and I was sent to voicemail. I have left a voice message requesting a call back to our office in regards to scheduling.

## 2024-01-20 ENCOUNTER — Ambulatory Visit: Admitting: Behavioral Health

## 2024-01-31 ENCOUNTER — Encounter: Payer: Self-pay | Admitting: Behavioral Health

## 2024-01-31 ENCOUNTER — Ambulatory Visit: Admitting: Behavioral Health

## 2024-01-31 ENCOUNTER — Other Ambulatory Visit (HOSPITAL_BASED_OUTPATIENT_CLINIC_OR_DEPARTMENT_OTHER): Payer: Self-pay

## 2024-01-31 DIAGNOSIS — F331 Major depressive disorder, recurrent, moderate: Secondary | ICD-10-CM | POA: Diagnosis not present

## 2024-01-31 DIAGNOSIS — F411 Generalized anxiety disorder: Secondary | ICD-10-CM

## 2024-01-31 DIAGNOSIS — F410 Panic disorder [episodic paroxysmal anxiety] without agoraphobia: Secondary | ICD-10-CM | POA: Diagnosis not present

## 2024-01-31 DIAGNOSIS — F3341 Major depressive disorder, recurrent, in partial remission: Secondary | ICD-10-CM

## 2024-01-31 DIAGNOSIS — F4323 Adjustment disorder with mixed anxiety and depressed mood: Secondary | ICD-10-CM | POA: Diagnosis not present

## 2024-01-31 MED ORDER — SERTRALINE HCL 100 MG PO TABS
100.0000 mg | ORAL_TABLET | Freq: Every day | ORAL | 1 refills | Status: AC
  Filled 2024-01-31: qty 90, 90d supply, fill #0

## 2024-01-31 MED ORDER — BUPROPION HCL ER (XL) 150 MG PO TB24
150.0000 mg | ORAL_TABLET | Freq: Every day | ORAL | 3 refills | Status: AC
  Filled 2024-01-31: qty 30, 30d supply, fill #0
  Filled 2024-04-04: qty 30, 30d supply, fill #1
  Filled ????-??-??: fill #0

## 2024-01-31 NOTE — Progress Notes (Signed)
 [facility]  Patient ID: Naimah A Nyman,  MRN: [id number]  PCP: Antoniette Vermell CROME, PA-C  Date of Evaluation: 01/31/2024 Time spent:30 minutes  Chief Complaint:  Chief Complaint   Depression; Anxiety; Follow-up; Medication Refill; Patient Education     HISTORY/CURRENT STATUS: HPI Theresa Beard, pronounced 6, 48 year old female presents to this office for follow up and to establish care. She is dressed very neat and well groomed. She is pleasant and smiling again this visit.  She the  reports great stability since last visit.  She has been taking her medications as prescribed.  Now that she has had a good long period of stability she is interested in possibly weaning down on her medication.   Reports her anxiety at 2/10 and depression at 2/10.  Reports that her sleep quality has improved, and no need for additional medication.  She denies mania, no psychosis, no SI/HI.    Prior psychiatric medication:   Lexapro -facial and lip swelling Wellbutrin - Prior Allergy, no problems with current regimen with second trial.     Individual Medical History/ Review of Systems: Changes? :No   Allergies: Lexapro  [escitalopram ], Metformin  and related, and Lisinopril   Current Medications:  Current Outpatient Medications:    atorvastatin  (LIPITOR) 40 MG tablet, Take 1 tablet (40 mg total) by mouth daily., Disp: 90 tablet, Rfl: 3   Blood Glucose Monitoring Suppl (BLOOD GLUCOSE MONITOR SYSTEM) w/Device KIT, Use to check blood sugar in the morning, at noon, and at bedtime., Disp: 1 kit, Rfl: 0   buPROPion  (WELLBUTRIN  XL) 150 MG 24 hr tablet, Take 1 tablet (150 mg total) by mouth daily., Disp: 30 tablet, Rfl: 3   esomeprazole  (NEXIUM ) 40 MG capsule, Take 1 capsule (40 mg total) by mouth 2 (two) times daily before a meal., Disp: 180 capsule, Rfl: 3   losartan  (COZAAR ) 100 MG tablet, Take 1 tablet (100 mg total) by mouth daily., Disp: 90 tablet, Rfl: 1   sertraline  (ZOLOFT ) 100 MG  tablet, Take 1 tablet (100 mg total) by mouth daily., Disp: 90 tablet, Rfl: 1   tirzepatide  (MOUNJARO ) 7.5 MG/0.5ML Pen, Inject 7.5 mg into the skin once a week., Disp: 6 mL, Rfl: 0 Medication Side Effects: none  Family Medical/ Social History: Changes? No  MENTAL HEALTH EXAM:  There were no vitals taken for this visit.There is no height or weight on file to calculate BMI.  General Appearance: Casual, Neat, and Well Groomed  Eye Contact:  Good  Speech:  Clear and Coherent  Volume:  Normal  Mood:  NA  Affect:  Appropriate  Thought Process:  Coherent  Orientation:  Full (Time, Place, and Person)  Thought Content: Logical   Suicidal Thoughts:  No  Homicidal Thoughts:  No  Memory:  WNL  Judgement:  Good  Insight:  Good  Psychomotor Activity:  Normal  Concentration:  Concentration: Good  Recall:  Good  Fund of Knowledge: Good  Language: Good  Assets:  Desire for Improvement  ADL's:  Intact  Cognition: WNL  Prognosis:  Good    DIAGNOSES:    ICD-10-CM   1. Recurrent major depressive disorder, in partial remission  F33.41     2. Generalized anxiety disorder  F41.1 buPROPion  (WELLBUTRIN  XL) 150 MG 24 hr tablet    sertraline  (ZOLOFT ) 100 MG tablet    3. Moderate episode of recurrent major depressive disorder (HCC)  F33.1 buPROPion  (WELLBUTRIN  XL) 150 MG 24 hr tablet    sertraline  (ZOLOFT ) 100 MG tablet    4. Adjustment  disorder with mixed anxiety and depressed mood  F43.23 buPROPion  (WELLBUTRIN  XL) 150 MG 24 hr tablet    sertraline  (ZOLOFT ) 100 MG tablet    5. Panic attacks  F41.0 sertraline  (ZOLOFT ) 100 MG tablet      Receiving Psychotherapy: No    RECOMMENDATIONS:  Greater than 50% of  30 min face to face time with patient was spent on counseling and coordination of care. We discussed her significant improvement with anxiety and depression this visit. She is requesting no medication changes but would like to discuss weaning next visit. To reduce  Zoloft  to 150 mg daily  for two weeks, then to 100 mg until next visit in 3 mo. To continue Wellbutrin  back to 150 mg XL daily Will report worsening symptoms or side effects To follow up in 3 months to reassess  No BC, not sexually active  Reviewed PDMP      Redell DELENA Pizza, NP

## 2024-02-10 ENCOUNTER — Other Ambulatory Visit (HOSPITAL_BASED_OUTPATIENT_CLINIC_OR_DEPARTMENT_OTHER): Payer: Self-pay

## 2024-02-16 ENCOUNTER — Other Ambulatory Visit (HOSPITAL_BASED_OUTPATIENT_CLINIC_OR_DEPARTMENT_OTHER): Payer: Self-pay

## 2024-02-16 ENCOUNTER — Other Ambulatory Visit: Payer: Self-pay | Admitting: Physician Assistant

## 2024-02-16 DIAGNOSIS — E1169 Type 2 diabetes mellitus with other specified complication: Secondary | ICD-10-CM

## 2024-02-17 ENCOUNTER — Other Ambulatory Visit (HOSPITAL_BASED_OUTPATIENT_CLINIC_OR_DEPARTMENT_OTHER): Payer: Self-pay

## 2024-02-17 MED ORDER — MOUNJARO 7.5 MG/0.5ML ~~LOC~~ SOAJ
7.5000 mg | SUBCUTANEOUS | 0 refills | Status: AC
  Filled 2024-02-17: qty 2, 28d supply, fill #0

## 2024-02-23 ENCOUNTER — Other Ambulatory Visit (HOSPITAL_BASED_OUTPATIENT_CLINIC_OR_DEPARTMENT_OTHER): Payer: Self-pay

## 2024-02-24 ENCOUNTER — Encounter: Payer: Self-pay | Admitting: Physician Assistant

## 2024-02-24 ENCOUNTER — Other Ambulatory Visit: Payer: Self-pay

## 2024-02-24 ENCOUNTER — Ambulatory Visit: Admitting: Physician Assistant

## 2024-02-24 VITALS — BP 148/85 | HR 85 | Ht 65.0 in | Wt 261.0 lb

## 2024-02-24 DIAGNOSIS — Z1211 Encounter for screening for malignant neoplasm of colon: Secondary | ICD-10-CM | POA: Diagnosis not present

## 2024-02-24 DIAGNOSIS — I10 Essential (primary) hypertension: Secondary | ICD-10-CM

## 2024-02-24 DIAGNOSIS — D509 Iron deficiency anemia, unspecified: Secondary | ICD-10-CM | POA: Diagnosis not present

## 2024-02-24 DIAGNOSIS — N926 Irregular menstruation, unspecified: Secondary | ICD-10-CM

## 2024-02-24 DIAGNOSIS — E1169 Type 2 diabetes mellitus with other specified complication: Secondary | ICD-10-CM

## 2024-02-24 DIAGNOSIS — E785 Hyperlipidemia, unspecified: Secondary | ICD-10-CM | POA: Diagnosis not present

## 2024-02-24 DIAGNOSIS — R638 Other symptoms and signs concerning food and fluid intake: Secondary | ICD-10-CM

## 2024-02-24 DIAGNOSIS — F331 Major depressive disorder, recurrent, moderate: Secondary | ICD-10-CM | POA: Diagnosis not present

## 2024-02-24 DIAGNOSIS — Z7985 Long-term (current) use of injectable non-insulin antidiabetic drugs: Secondary | ICD-10-CM

## 2024-02-24 DIAGNOSIS — E559 Vitamin D deficiency, unspecified: Secondary | ICD-10-CM

## 2024-02-24 LAB — POCT GLYCOSYLATED HEMOGLOBIN (HGB A1C): Hemoglobin A1C: 6 % — AB (ref 4.0–5.6)

## 2024-02-24 MED ORDER — TIRZEPATIDE 10 MG/0.5ML ~~LOC~~ SOAJ
10.0000 mg | SUBCUTANEOUS | 0 refills | Status: DC
  Filled 2024-02-24: qty 6, 84d supply, fill #0

## 2024-02-24 NOTE — Patient Instructions (Signed)
 Increased mounjaro  to 10mg  weekly.

## 2024-02-27 ENCOUNTER — Other Ambulatory Visit (HOSPITAL_BASED_OUTPATIENT_CLINIC_OR_DEPARTMENT_OTHER): Payer: Self-pay

## 2024-02-27 DIAGNOSIS — R638 Other symptoms and signs concerning food and fluid intake: Secondary | ICD-10-CM | POA: Insufficient documentation

## 2024-02-27 DIAGNOSIS — N926 Irregular menstruation, unspecified: Secondary | ICD-10-CM | POA: Insufficient documentation

## 2024-02-27 MED ORDER — LOSARTAN POTASSIUM 100 MG PO TABS
100.0000 mg | ORAL_TABLET | Freq: Every day | ORAL | 0 refills | Status: AC
  Filled 2024-02-27: qty 90, 90d supply, fill #0

## 2024-02-27 NOTE — Progress Notes (Signed)
 "  Established Patient Office Visit  Subjective   Patient ID: Theresa Beard, female    DOB: 1975/04/12  Age: 48 y.o. MRN: [id number]  Chief Complaint  Patient presents with   Follow-up    DM / MEDICATION    HPI .SABRADiscussed the use of AI scribe software for clinical note transcription with the patient, who gave verbal consent to proceed.  History of Present Illness Theresa Beard is a 48 year old female with diabetes who presents with concerns about sugar cravings and weight management at 3 month follow up.   Glycemic control and sugar cravings - Persistent sugar cravings despite treatment with Mounjaro  7.5 mg - Cravings perceived as 'out of control' - Concern for possible hormonal contribution to cravings - not checking glucose - not exercising regularly but walking a lot  Menstrual irregularity - Menstrual cycles have become irregular - Periods now last three to four days  Weight management and physical activity - Increase in body weight despite decrease in clothing size - Engages in regular walking and recently initiated weight lifting - Reports increased energy levels since starting weight lifting  Visual disturbance - Vision problems affecting one eye - she agrees to schedule eye exam  Gastroesophageal reflux disease - History of acid reflux - Currently taking medication for acid reflux  Mood and anxiety - Experiencing anxiety attributed to a recent meeting - Overall mood is good - seeing Theresa Beard at behavioral health - on zoloft  and wellbutrin     ROS See HPI.    Objective:     BP (!) 148/85 (BP Location: Right Arm, Patient Position: Sitting, Cuff Size: Large)   Pulse 85   Ht 5' 5 (1.651 m)   Wt 261 lb (118.4 kg)   SpO2 96%   BMI 43.43 kg/m  BP Readings from Last 3 Encounters:  02/24/24 (!) 148/85  05/06/23 120/79  04/30/22 (!) 146/78   Wt Readings from Last 3 Encounters:  02/24/24 261 lb (118.4 kg)  05/06/23 250 lb  1.9 oz (113.5 kg)  04/30/22 278 lb 0.6 oz (126.1 kg)      Physical Exam Constitutional:      Appearance: Normal appearance. She is obese.  HENT:     Head: Normocephalic.  Cardiovascular:     Rate and Rhythm: Normal rate and regular rhythm.  Pulmonary:     Effort: Pulmonary effort is normal.     Breath sounds: Normal breath sounds.  Musculoskeletal:     Right lower leg: No edema.     Left lower leg: No edema.  Neurological:     General: No focal deficit present.     Mental Status: She is alert and oriented to person, place, and time.  Psychiatric:        Mood and Affect: Mood normal.      Results for orders placed or performed in visit on 02/24/24  POCT glycosylated hemoglobin (Hb A1C)  Result Value Ref Range   Hemoglobin A1C 6.0 (A) 4.0 - 5.6 %   HbA1c POC (<> result, manual entry)     HbA1c, POC (prediabetic range)     HbA1c, POC (controlled diabetic range)        The 10-year ASCVD risk score (Arnett DK, et al., 2019) is: 3.2%    Assessment & Plan:  .SABRAJitruttana Beard was seen today for follow-up.  Diagnoses and all orders for this visit:  Hyperlipidemia associated with type 2 diabetes mellitus (HCC) -     CMP14+EGFR -  POCT glycosylated hemoglobin (Hb A1C) -     tirzepatide  (MOUNJARO ) 10 MG/0.5ML Pen; Inject 10 mg into the skin once a week.  HYPERTENSION, MILD -     CBC with Differential/Platelet -     CMP14+EGFR -     losartan  (COZAAR ) 100 MG tablet; Take 1 tablet (100 mg total) by mouth daily.  Vitamin D  deficiency -     VITAMIN D  25 Hydroxy (Vit-D Deficiency, Fractures)  Craving for particular food -     CBC with Differential/Platelet -     CMP14+EGFR -     Lipid panel -     TSH -     VITAMIN D  25 Hydroxy (Vit-D Deficiency, Fractures) -     Vitamin B12 -     Estradiol -     Progesterone -     FSH/LH  Vitamin D  insufficiency -     VITAMIN D  25 Hydroxy (Vit-D Deficiency, Fractures)  Microcytic anemia -     CBC with  Differential/Platelet  Hyperlipidemia LDL goal <70 -     Lipid panel  Menstrual changes -     Estradiol -     Progesterone -     FSH/LH  Colon cancer screening -     Cologuard  Moderate episode of recurrent major depressive disorder (HCC)  Morbid obesity (HCC)   Assessment & Plan Type 2 diabetes mellitus associated with hyperlipidemia Recent increase in sugar cravings despite Mounjaro  7.5 mg. A1c worsened from 5.4 to 6.0, to goal. Concerns about hormonal changes affecting menstrual cycle and cravings. - Ordered hormone panel. - Continue Mounjaro   but increased to 10mg  weekly.  - Encouraged regular physical activity and making sure diet reflects low sugar/carb.  - Please schedule eye exam - on statin, LDL goal under 70 - Flu shot UTD, declined pneumonia and covid vaccine  Essential hypertension Recent increase in blood pressure readings. Elevated during visit, possibly due to stress. Discussed weight loss impact on management. BPgoal under 130/80 - Continue losartan . - Monitor blood pressure regularly. - Encouraged weight management and physical activity.  Menstrual abnormalities - hormone panel ordered  MDD - managed by [facility] - on zoloft  and wellbutrin   Colon cancer screening - declined colonoscopy but agreed to cologuard - placed cologuard order     Return in about 3 months (around 05/24/2024).    Ralphie Lovelady, PA-C  "

## 2024-03-05 ENCOUNTER — Other Ambulatory Visit (HOSPITAL_COMMUNITY): Payer: Self-pay

## 2024-03-05 MED ORDER — TIRZEPATIDE 10 MG/0.5ML ~~LOC~~ SOAJ
10.0000 mg | SUBCUTANEOUS | 0 refills | Status: AC
  Filled 2024-03-05: qty 2, 28d supply, fill #0
  Filled 2024-04-04: qty 2, 28d supply, fill #1

## 2024-03-07 LAB — CBC WITH DIFFERENTIAL/PLATELET
Basophils Absolute: 0.1 x10E3/uL (ref 0.0–0.2)
Basos: 1 %
EOS (ABSOLUTE): 0.3 x10E3/uL (ref 0.0–0.4)
Eos: 4 %
Hematocrit: 39.4 % (ref 34.0–46.6)
Hemoglobin: 11.9 g/dL (ref 11.1–15.9)
Immature Grans (Abs): 0 x10E3/uL (ref 0.0–0.1)
Immature Granulocytes: 0 %
Lymphocytes Absolute: 2.5 x10E3/uL (ref 0.7–3.1)
Lymphs: 33 %
MCH: 22.2 pg — ABNORMAL LOW (ref 26.6–33.0)
MCHC: 30.2 g/dL — ABNORMAL LOW (ref 31.5–35.7)
MCV: 74 fL — ABNORMAL LOW (ref 79–97)
Monocytes Absolute: 0.6 x10E3/uL (ref 0.1–0.9)
Monocytes: 7 %
Neutrophils Absolute: 4.2 x10E3/uL (ref 1.4–7.0)
Neutrophils: 55 %
Platelets: 480 x10E3/uL — ABNORMAL HIGH (ref 150–450)
RBC: 5.36 x10E6/uL — ABNORMAL HIGH (ref 3.77–5.28)
RDW: 16.2 % — ABNORMAL HIGH (ref 11.7–15.4)
WBC: 7.6 x10E3/uL (ref 3.4–10.8)

## 2024-03-07 LAB — LIPID PANEL
Chol/HDL Ratio: 3.2 ratio (ref 0.0–4.4)
Cholesterol, Total: 149 mg/dL (ref 100–199)
HDL: 47 mg/dL
LDL Chol Calc (NIH): 83 mg/dL (ref 0–99)
Triglycerides: 103 mg/dL (ref 0–149)
VLDL Cholesterol Cal: 19 mg/dL (ref 5–40)

## 2024-03-07 LAB — CMP14+EGFR
ALT: 19 IU/L (ref 0–32)
AST: 19 IU/L (ref 0–40)
Albumin: 4.4 g/dL (ref 3.9–4.9)
Alkaline Phosphatase: 76 IU/L (ref 41–116)
BUN/Creatinine Ratio: 18 (ref 9–23)
BUN: 14 mg/dL (ref 6–24)
Bilirubin Total: 0.4 mg/dL (ref 0.0–1.2)
CO2: 21 mmol/L (ref 20–29)
Calcium: 10.2 mg/dL (ref 8.7–10.2)
Chloride: 101 mmol/L (ref 96–106)
Creatinine, Ser: 0.78 mg/dL (ref 0.57–1.00)
Globulin, Total: 3.3 g/dL (ref 1.5–4.5)
Glucose: 85 mg/dL (ref 70–99)
Potassium: 4.3 mmol/L (ref 3.5–5.2)
Sodium: 138 mmol/L (ref 134–144)
Total Protein: 7.7 g/dL (ref 6.0–8.5)
eGFR: 93 mL/min/1.73

## 2024-03-07 LAB — FSH/LH
FSH: 10.5 m[IU]/mL
LH: 5.6 m[IU]/mL

## 2024-03-07 LAB — TSH: TSH: 4.7 u[IU]/mL — ABNORMAL HIGH (ref 0.450–4.500)

## 2024-03-07 LAB — ESTRADIOL: Estradiol: 72.5 pg/mL

## 2024-03-07 LAB — PROGESTERONE: Progesterone: <0.1 ng/mL

## 2024-03-07 LAB — VITAMIN D 25 HYDROXY (VIT D DEFICIENCY, FRACTURES): Vit D, 25-Hydroxy: 16.4 ng/mL — ABNORMAL LOW (ref 30.0–100.0)

## 2024-03-07 LAB — VITAMIN B12: Vitamin B-12: 419 pg/mL (ref 232–1245)

## 2024-03-09 ENCOUNTER — Ambulatory Visit: Payer: Self-pay | Admitting: Physician Assistant

## 2024-03-09 NOTE — Progress Notes (Signed)
 [name],   Hemoglobin improving.  Cholesterol looks GREAT.  B12 good.  Vitamin D  still very low. How much vitamin D  are you taking daily?  TSH elevated trending towards HYPO low thyroid . Would like to add anti-tpo, free T3 and T4 to labs to better evaluate.  Hormones show peri-menopausal but not in menopause.

## 2024-03-15 ENCOUNTER — Other Ambulatory Visit (HOSPITAL_BASED_OUTPATIENT_CLINIC_OR_DEPARTMENT_OTHER): Payer: Self-pay

## 2024-05-03 ENCOUNTER — Ambulatory Visit: Admitting: Behavioral Health
